# Patient Record
Sex: Female | Born: 1965 | Race: White | Hispanic: No | Smoking: Former smoker
Health system: Southern US, Community
[De-identification: ages and names within clinical notes are randomized; demographics above are authoritative.]

## PROBLEM LIST (undated history)

## (undated) DIAGNOSIS — I10 Essential (primary) hypertension: Secondary | ICD-10-CM

## (undated) DIAGNOSIS — J302 Other seasonal allergic rhinitis: Secondary | ICD-10-CM

## (undated) DIAGNOSIS — E7800 Pure hypercholesterolemia, unspecified: Secondary | ICD-10-CM

## (undated) DIAGNOSIS — J45909 Unspecified asthma, uncomplicated: Secondary | ICD-10-CM

## (undated) DIAGNOSIS — M1990 Unspecified osteoarthritis, unspecified site: Secondary | ICD-10-CM

## (undated) DIAGNOSIS — R7303 Prediabetes: Secondary | ICD-10-CM

## (undated) HISTORY — PX: TOTAL SHOULDER ARTHROPLASTY: SHX126

## (undated) HISTORY — DX: Unspecified osteoarthritis, unspecified site: M19.90

## (undated) HISTORY — DX: Prediabetes: R73.03

## (undated) HISTORY — PX: CARPAL TUNNEL RELEASE: SHX101

## (undated) HISTORY — DX: Pure hypercholesterolemia, unspecified: E78.00

---

## 1997-04-27 ENCOUNTER — Other Ambulatory Visit: Admission: RE | Admit: 1997-04-27 | Discharge: 1997-04-27 | Payer: Self-pay | Admitting: Obstetrics and Gynecology

## 1998-04-30 ENCOUNTER — Other Ambulatory Visit: Admission: RE | Admit: 1998-04-30 | Discharge: 1998-04-30 | Payer: Self-pay | Admitting: Obstetrics and Gynecology

## 1999-04-24 ENCOUNTER — Other Ambulatory Visit: Admission: RE | Admit: 1999-04-24 | Discharge: 1999-04-24 | Payer: Self-pay | Admitting: Obstetrics and Gynecology

## 1999-12-14 HISTORY — PX: CHOLECYSTECTOMY: SHX55

## 1999-12-23 ENCOUNTER — Inpatient Hospital Stay (HOSPITAL_COMMUNITY): Admission: AD | Admit: 1999-12-23 | Discharge: 1999-12-26 | Payer: Self-pay | Admitting: Obstetrics and Gynecology

## 2000-05-05 ENCOUNTER — Other Ambulatory Visit: Admission: RE | Admit: 2000-05-05 | Discharge: 2000-05-05 | Payer: Self-pay | Admitting: Obstetrics and Gynecology

## 2000-10-29 ENCOUNTER — Ambulatory Visit (HOSPITAL_COMMUNITY): Admission: RE | Admit: 2000-10-29 | Discharge: 2000-10-29 | Payer: Self-pay | Admitting: Obstetrics and Gynecology

## 2000-10-29 ENCOUNTER — Encounter: Payer: Self-pay | Admitting: Obstetrics and Gynecology

## 2002-02-22 ENCOUNTER — Other Ambulatory Visit: Admission: RE | Admit: 2002-02-22 | Discharge: 2002-02-22 | Payer: Self-pay | Admitting: Gynecology

## 2003-03-28 ENCOUNTER — Other Ambulatory Visit: Admission: RE | Admit: 2003-03-28 | Discharge: 2003-03-28 | Payer: Self-pay | Admitting: Obstetrics and Gynecology

## 2004-04-18 ENCOUNTER — Other Ambulatory Visit: Admission: RE | Admit: 2004-04-18 | Discharge: 2004-04-18 | Payer: Self-pay | Admitting: Obstetrics and Gynecology

## 2007-01-14 HISTORY — PX: SHOULDER OPEN ROTATOR CUFF REPAIR: SHX2407

## 2012-05-20 ENCOUNTER — Other Ambulatory Visit: Payer: Self-pay | Admitting: Obstetrics and Gynecology

## 2012-05-20 DIAGNOSIS — R928 Other abnormal and inconclusive findings on diagnostic imaging of breast: Secondary | ICD-10-CM

## 2012-06-01 ENCOUNTER — Ambulatory Visit
Admission: RE | Admit: 2012-06-01 | Discharge: 2012-06-01 | Disposition: A | Discharge status: Home / Self Care | Payer: Managed Care, Other (non HMO) | Source: Ambulatory Visit | Attending: Obstetrics and Gynecology | Admitting: Obstetrics and Gynecology

## 2012-06-01 ENCOUNTER — Other Ambulatory Visit: Payer: Self-pay | Admitting: Obstetrics and Gynecology

## 2012-06-01 DIAGNOSIS — R928 Other abnormal and inconclusive findings on diagnostic imaging of breast: Secondary | ICD-10-CM

## 2012-06-09 ENCOUNTER — Encounter (INDEPENDENT_AMBULATORY_CARE_PROVIDER_SITE_OTHER): Payer: Self-pay | Admitting: General Surgery

## 2012-06-09 ENCOUNTER — Ambulatory Visit (INDEPENDENT_AMBULATORY_CARE_PROVIDER_SITE_OTHER): Payer: PRIVATE HEALTH INSURANCE | Admitting: General Surgery

## 2012-06-09 VITALS — BP 126/74 | HR 68 | Temp 98.4°F | Resp 18 | Ht 65.0 in | Wt 234.0 lb

## 2012-06-09 DIAGNOSIS — R928 Other abnormal and inconclusive findings on diagnostic imaging of breast: Secondary | ICD-10-CM | POA: Insufficient documentation

## 2012-06-09 NOTE — Patient Instructions (Addendum)
Your recent mammogram showed a small abnormally shaped density in the lower outer quadrant of the right breast. The biopsy did not show cancer, but there is a small chance that this could lead to cancer or there might be cancer hidden nearby. You had been advised to have this area excised conservatively.  You'll be scheduled for a right breast biopsy, also known as a right partial mastectomy with needle localization in the near future.  We will let the pathology report 2 or 3 days postoperatively.    Lumpectomy, Breast Conserving Surgery A lumpectomy is breast surgery that removes only part of the breast. Another name used may be partial mastectomy. The amount removed varies. Make sure you understand how much of your breast will be removed. Reasons for a lumpectomy:  Any solid breast mass.  Grouped significant nodularity that may be confused with a solitary breast mass. Lumpectomy is the most common form of breast cancer surgery today. The surgeon removes the portion of your breast which contains the tumor (cancer). This is the lump. Some normal tissue around the lump is also removed to be sure that all the tumor has been removed.  If cancer cells are found in the margins where the breast tissue was removed, your surgeon will do more surgery to remove the remaining cancer tissue. This is called re-excision surgery. Radiation and/or chemotherapy treatments are often given following a lumpectomy to kill any cancer cells that could possibly remain.  REASONS YOU MAY NOT BE ABLE TO HAVE BREAST CONSERVING SURGERY:  The tumor is located in more than one place.  Your breast is small and the tumor is large so the breast would be disfigured.  The entire tumor removal is not successful with a lumpectomy.  You cannot commit to a full course of chemotherapy, radiation therapy or are pregnant and cannot have radiation.  You have previously had radiation to the breast to treat cancer. HOW A LUMPECTOMY  IS PERFORMED If overnight nursing is not required following a biopsy, a lumpectomy can be performed as a same-day surgery. This can be done in a hospital, clinic, or surgical center. The anesthesia used will depend on your surgeon. They will discuss this with you. A general anesthetic keeps you sleeping through the procedure. LET YOUR CAREGIVERS KNOW ABOUT THE FOLLOWING:  Allergies  Medications taken including herbs, eye drops, over the counter medications, and creams.  Use of steroids (by mouth or creams)  Previous problems with anesthetics or Novocaine.  Possibility of pregnancy, if this applies  History of blood clots (thrombophlebitis)  History of bleeding or blood problems.  Previous surgery  Other health problems BEFORE THE PROCEDURE You should be present one hour prior to your procedure unless directed otherwise.  AFTER THE PROCEDURE  After surgery, you will be taken to the recovery area where a nurse will watch and check your progress. Once you're awake, stable, and taking fluids well, barring other problems you will be allowed to go home.  Ice packs applied to your operative site may help with discomfort and keep the swelling down.  A small rubber drain may be placed in the breast for a couple of days to prevent a hematoma from developing in the breast.  A pressure dressing may be applied for 24 to 48 hours to prevent bleeding.  Keep the wound dry.  You may resume a normal diet and activities as directed. Avoid strenuous activities affecting the arm on the side of the biopsy site such as tennis, swimming,  heavy lifting (more than 10 pounds) or pulling.  Bruising in the breast is normal following this procedure.  Wearing a bra - even to bed - may be more comfortable and also help keep the dressing on.  Change dressings as directed.  Only take over-the-counter or prescription medicines for pain, discomfort, or fever as directed by your caregiver. Call for your  results as instructed by your surgeon. Remember it is your responsibility to get the results of your lumpectomy if your surgeon asked you to follow-up. Do not assume everything is fine if you have not heard from your caregiver. SEEK MEDICAL CARE IF:   There is increased bleeding (more than a small spot) from the wound.  You notice redness, swelling, or increasing pain in the wound.  Pus is coming from wound.  An unexplained oral temperature above 102 F (38.9 C) develops.  You notice a foul smell coming from the wound or dressing. SEEK IMMEDIATE MEDICAL CARE IF:   You develop a rash.  You have difficulty breathing.  You have any allergic problems. Document Released: 02/10/2006 Document Revised: 03/24/2011 Document Reviewed: 05/14/2006 [facility] Patient Information 2014 [place], [place].

## 2012-06-09 NOTE — Progress Notes (Signed)
Patient ID: Mercedes [name], female   DOB: 16-Jan-1965, 47 y.o.   MRN: [id number]  Chief Complaint  Patient presents with  . New Evaluation    lesion rt breast    HPI Mercedes [name] is a 47 y.o. female.  She is referred by Dr. Ulyess Blossom for evaluation and management of an abnormal mammographic finding of the right breast, lower outer quadrant.   The patient has no prior history of breast palm problems, and she gets annual screening mammograms. Recent screening mammograms showed a normal left breast, but noticed a new 9 mm irregular density at the 8:00 position of the right breast, 8 cm from the nipple. This is less than 1 cm in size. 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 Dr. Sadie Haber referred her and suggested that this area be excised.  Family history is positive for breast cancer in a paternal aunt,, But no other significant family history.  Comorbidities include obesity, hypertension, and anxiety and depression. HPI  History reviewed. No pertinent past medical history.  Past Surgical History  Procedure Laterality Date  . Shoulder open rotator cuff repair  2009    rt shoulder  . Cholecystectomy  12/01  . Carpal tunnel release  unsure    bilaterally  . Cesarean section  12/01    Family History  Problem Relation Age of Onset  . Diabetes Mother   . Hypertension Mother   . Hyperlipidemia Mother   . COPD Father   . Arthritis Father     Social History History  Substance Use Topics  . Smoking status: Former Smoker    Quit date: 01/14/1995  . Smokeless tobacco: Not on file  . Alcohol Use: No    No Known Allergies  Current Outpatient Prescriptions  Medication Sig Dispense Refill  . atorvastatin (LIPITOR) 40 MG tablet       . buPROPion (WELLBUTRIN XL) 300 MG 24 hr tablet       . lisinopril-hydrochlorothiazide (PRINZIDE,ZESTORETIC) 20-12.5 MG per tablet       . sertraline (ZOLOFT) 50 MG tablet       . valACYclovir (VALTREX) 1000 MG  tablet        No current facility-administered medications for this visit.    Review of Systems Review of Systems  Constitutional: Negative for fever, chills and unexpected weight change.  HENT: Negative for hearing loss, congestion, sore throat, trouble swallowing and voice change.   Eyes: Negative for visual disturbance.  Respiratory: Negative for cough and wheezing.   Cardiovascular: Negative for chest pain, palpitations and leg swelling.  Gastrointestinal: Negative for nausea, vomiting, abdominal pain, diarrhea, constipation, blood in stool, abdominal distention and anal bleeding.  Genitourinary: Negative for hematuria, vaginal bleeding and difficulty urinating.  Musculoskeletal: Negative for arthralgias.  Skin: Negative for rash and wound.  Neurological: Negative for seizures, syncope and headaches.  Hematological: Negative for adenopathy. Does not bruise/bleed easily.  Psychiatric/Behavioral: Negative for confusion.    Blood pressure 126/74, pulse 68, temperature 98.4 F (36.9 C), resp. rate 18, height 5' 5"  (1.651 m), weight 234 lb (106.142 kg), last menstrual period 05/19/2012.  Physical Exam Physical Exam  Constitutional: She is oriented to person, place, and time. She appears well-developed and well-nourished. No distress.  HENT:  Head: Normocephalic and atraumatic.  Nose: Nose normal.  Mouth/Throat: No oropharyngeal exudate.  Eyes: Conjunctivae and EOM are normal. Pupils are equal, round, and reactive to light. Left eye exhibits no discharge. No scleral icterus.  Neck: Neck supple.  No JVD present. No tracheal deviation present. No thyromegaly present.  Cardiovascular: Normal rate, regular rhythm, normal heart sounds and intact distal pulses.   No murmur heard. Pulmonary/Chest: Effort normal and breath sounds normal. No respiratory distress. She has no wheezes. She has no rales. She exhibits no tenderness.  Breasts are moderate to moderately large in size. Biopsy site  right breast, lower outer quadrant. Minimal ecchymoses. No hematoma. No palpable mass in either breast. No other skin changes. No axillary adenopathy.  Abdominal: Soft. Bowel sounds are normal. She exhibits no distension and no mass. There is no tenderness. There is no rebound and no guarding.  Musculoskeletal: She exhibits no edema and no tenderness.  Lymphadenopathy:    She has no cervical adenopathy.  Neurological: She is alert and oriented to person, place, and time. She exhibits normal muscle tone. Coordination normal.  Skin: Skin is warm. No rash noted. She is not diaphoretic. No erythema. No pallor.  Psychiatric: She has a normal mood and affect. Her behavior is normal. Judgment and thought content normal.    Data Reviewed Imaging studies. Pathology report.  Assessment    Abnormal mammogram right breast, lower outer quadrant. Biopsy shows complex sclerosing lesion. Excision recommended by radiologist.  Hypertension  Obesity  Anxiety and depression     Plan    The patient was offered a conservative right partial mastectomy with needle localization, and she would like to do that  She was scheduled for right partial mastectomy with needle localization the near future.  I discussed the indications, details, techniques, and numerous risks of the surgery with her. She is aware of the risk of bleeding, infection, cosmetic deformity, skin necrosis, reoperation if this is cancer, nerve damage with chronic pain and other unforseen problems. She understands all these issues. All of her questions are answered. She agrees with this plan.        Edsel Petrin. Dalbert Batman, M.D., [facility] General and Minimally invasive Surgery Breast and Colorectal Surgery Office:   [phone] Pager:   [phone]  06/09/2012, 4:04 PM

## 2012-06-16 ENCOUNTER — Telehealth (INDEPENDENT_AMBULATORY_CARE_PROVIDER_SITE_OTHER): Payer: Self-pay

## 2012-06-16 NOTE — Telephone Encounter (Signed)
I called the pt and let her know that I am working on her postop appointment.  Her surgery is 6/27 and I may need to get some additional office time added on to get postoperative patients in.

## 2012-06-19 DIAGNOSIS — B029 Zoster without complications: Secondary | ICD-10-CM | POA: Insufficient documentation

## 2012-07-05 ENCOUNTER — Telehealth (INDEPENDENT_AMBULATORY_CARE_PROVIDER_SITE_OTHER): Payer: Self-pay

## 2012-07-05 ENCOUNTER — Encounter (HOSPITAL_BASED_OUTPATIENT_CLINIC_OR_DEPARTMENT_OTHER): Payer: Self-pay | Admitting: *Deleted

## 2012-07-05 NOTE — Telephone Encounter (Signed)
I called the patient and gave her the postop appointment for 07-22-12 at 12:30

## 2012-07-05 NOTE — Progress Notes (Signed)
To come in for bmet-ekg-has been several yr since last ekg- Has some allergy-asthma-

## 2012-07-07 ENCOUNTER — Other Ambulatory Visit: Payer: Self-pay

## 2012-07-07 ENCOUNTER — Encounter (HOSPITAL_BASED_OUTPATIENT_CLINIC_OR_DEPARTMENT_OTHER)
Admission: RE | Admit: 2012-07-07 | Discharge: 2012-07-07 | Disposition: A | Discharge status: Home / Self Care | Payer: PRIVATE HEALTH INSURANCE | Source: Ambulatory Visit | Attending: General Surgery | Admitting: General Surgery

## 2012-07-07 LAB — BASIC METABOLIC PANEL
CO2: 29 mEq/L (ref 19–32)
Chloride: 100 mEq/L (ref 96–112)
Creatinine, Ser: 0.64 mg/dL (ref 0.50–1.10)
GFR calc Af Amer: >90 mL/min (ref >90)
Potassium: 4 mEq/L (ref 3.5–5.1)
Sodium: 136 mEq/L (ref 135–145)

## 2012-07-07 NOTE — H&P (Signed)
Mercedes [name]   MRN:  [id number]   Description: 47 year old female  Provider: Adin Hector, MD  Department: [facility]        Diagnoses    Abnormal mammogram    -  Primary    793.80            Current Vitals    BP Pulse Temp(Src) Resp Ht Wt    126/74 68 98.4 F (36.9 C) 18 5' 5"  (1.651 m) 234 lb (106.142 kg)    BMI - 38.94 kg/m2 05/19/2012               History and Physical   Adin Hector, MD     Status: Signed                              HPI Mercedes [name] is a 47 y.o. female.  She is referred by Dr. Ulyess [name] for evaluation and management of an abnormal mammographic finding of the right breast, lower outer quadrant.    The patient has no prior history of breast palm problems, and she gets annual screening mammograms. Recent screening mammograms showed a normal left breast, but noticed a new 9 mm irregular density at the 8:00 position of the right breast, 8 cm from the nipple. This is less than 1 cm in size. 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 Dr. Sadie [name] referred her and suggested that this area be excised.   Family history is positive for breast cancer in a paternal aunt,, But no other significant family history.   Comorbidities include obesity, hypertension, and anxiety and depression.     .    Past Surgical History   Procedure  Laterality  Date   .  Shoulder open rotator cuff repair    2009       rt shoulder   .  Cholecystectomy    12/01   .  Carpal tunnel release    unsure       bilaterally   .  Cesarean section    12/01         Family History   Problem  Relation  Age of Onset   .  Diabetes  Mother     .  Hypertension  Mother     .  Hyperlipidemia  Mother     .  COPD  Father     .  Arthritis  Father          Social History History   Substance Use Topics   .  Smoking status:  Former Smoker       Quit date:  01/14/1995   .  Smokeless tobacco:  Not on file   .   Alcohol Use:  No        No Known Allergies    Current Outpatient Prescriptions   Medication  Sig  Dispense  Refill   .  atorvastatin (LIPITOR) 40 MG tablet           .  buPROPion (WELLBUTRIN XL) 300 MG 24 hr tablet           .  lisinopril-hydrochlorothiazide (PRINZIDE,ZESTORETIC) 20-12.5 MG per tablet           .  sertraline (ZOLOFT) 50 MG tablet           .  valACYclovir (VALTREX) 1000 MG tablet  No current facility-administered medications for this visit.        Review of Systems  Constitutional: Negative for fever, chills and unexpected weight change.  HENT: Negative for hearing loss, congestion, sore throat, trouble swallowing and voice change.   Eyes: Negative for visual disturbance.  Respiratory: Negative for cough and wheezing.   Cardiovascular: Negative for chest pain, palpitations and leg swelling.  Gastrointestinal: Negative for nausea, vomiting, abdominal pain, diarrhea, constipation, blood in stool, abdominal distention and anal bleeding.  Genitourinary: Negative for hematuria, vaginal bleeding and difficulty urinating.  Musculoskeletal: Negative for arthralgias.  Skin: Negative for rash and wound.  Neurological: Negative for seizures, syncope and headaches.  Hematological: Negative for adenopathy. Does not bruise/bleed easily.  Psychiatric/Behavioral: Negative for confusion.      Blood pressure 126/74, pulse 68, temperature 98.4 F (36.9 C), resp. rate 18, height 5' 5"  (1.651 m), weight 234 lb (106.142 kg), last menstrual period 05/19/2012.   Physical Exam  Constitutional: She is oriented to person, place, and time. She appears well-developed and well-nourished. No distress.  HENT:   Head: Normocephalic and atraumatic.   Nose: Nose normal.   Mouth/Throat: No oropharyngeal exudate.  Eyes: Conjunctivae and EOM are normal. Pupils are equal, round, and reactive to light. Left eye exhibits no discharge. No scleral icterus.  Neck: Neck supple.  No JVD present. No tracheal deviation present. No thyromegaly present.  Cardiovascular: Normal rate, regular rhythm, normal heart sounds and intact distal pulses.    No murmur heard. Pulmonary/Chest: Effort normal and breath sounds normal. No respiratory distress. She has no wheezes. She has no rales. She exhibits no tenderness.  Breasts are moderate to moderately large in size. Biopsy site right breast, lower outer quadrant. Minimal ecchymoses. No hematoma. No palpable mass in either breast. No other skin changes. No axillary adenopathy.  Abdominal: Soft. Bowel sounds are normal. She exhibits no distension and no mass. There is no tenderness. There is no rebound and no guarding.  Musculoskeletal: She exhibits no edema and no tenderness.  Lymphadenopathy:    She has no cervical adenopathy.  Neurological: She is alert and oriented to person, place, and time. She exhibits normal muscle tone. Coordination normal.  Skin: Skin is warm. No rash noted. She is not diaphoretic. No erythema. No pallor.  Psychiatric: She has a normal mood and affect. Her behavior is normal. Judgment and thought content normal.      Data Reviewed Imaging studies. Pathology report.   Assessment    Abnormal mammogram right breast, lower outer quadrant. Biopsy shows complex sclerosing lesion. Excision recommended by radiologist.   Hypertension   Obesity   Anxiety and depression      Plan    The patient was offered a conservative right partial mastectomy with needle localization, and she would like to do that   She was scheduled for right partial mastectomy with needle localization the near future.   I discussed the indications, details, techniques, and numerous risks of the surgery with her. She is aware of the risk of bleeding, infection, cosmetic deformity, skin necrosis, reoperation if this is cancer, nerve damage with chronic pain and other unforseen problems. She understands all these issues. All of  her questions are answered. She agrees with this plan.           Mercedes [name]. Mercedes [name], M.D., [facility] General and Minimally invasive Surgery Breast and Colorectal Surgery Office:   [phone] Pager:   [phone]

## 2012-07-09 ENCOUNTER — Ambulatory Visit
Admission: RE | Admit: 2012-07-09 | Discharge: 2012-07-09 | Disposition: A | Discharge status: Home / Self Care | Payer: PRIVATE HEALTH INSURANCE | Source: Ambulatory Visit | Attending: General Surgery | Admitting: General Surgery

## 2012-07-09 ENCOUNTER — Encounter (HOSPITAL_BASED_OUTPATIENT_CLINIC_OR_DEPARTMENT_OTHER)
Admission: RE | Disposition: A | Discharge status: Home / Self Care | Payer: Self-pay | Source: Ambulatory Visit | Attending: General Surgery

## 2012-07-09 ENCOUNTER — Ambulatory Visit (HOSPITAL_BASED_OUTPATIENT_CLINIC_OR_DEPARTMENT_OTHER)
Admission: RE | Admit: 2012-07-09 | Discharge: 2012-07-09 | Disposition: A | Discharge status: Home / Self Care | Payer: PRIVATE HEALTH INSURANCE | Source: Ambulatory Visit | Attending: General Surgery | Admitting: General Surgery

## 2012-07-09 ENCOUNTER — Ambulatory Visit (HOSPITAL_BASED_OUTPATIENT_CLINIC_OR_DEPARTMENT_OTHER): Payer: PRIVATE HEALTH INSURANCE | Admitting: Certified Registered"

## 2012-07-09 ENCOUNTER — Encounter (HOSPITAL_BASED_OUTPATIENT_CLINIC_OR_DEPARTMENT_OTHER): Payer: Self-pay | Admitting: Certified Registered"

## 2012-07-09 DIAGNOSIS — Z79899 Other long term (current) drug therapy: Secondary | ICD-10-CM | POA: Insufficient documentation

## 2012-07-09 DIAGNOSIS — Z87891 Personal history of nicotine dependence: Secondary | ICD-10-CM | POA: Insufficient documentation

## 2012-07-09 DIAGNOSIS — F3289 Other specified depressive episodes: Secondary | ICD-10-CM | POA: Insufficient documentation

## 2012-07-09 DIAGNOSIS — I10 Essential (primary) hypertension: Secondary | ICD-10-CM | POA: Insufficient documentation

## 2012-07-09 DIAGNOSIS — R928 Other abnormal and inconclusive findings on diagnostic imaging of breast: Secondary | ICD-10-CM

## 2012-07-09 DIAGNOSIS — N6089 Other benign mammary dysplasias of unspecified breast: Secondary | ICD-10-CM

## 2012-07-09 DIAGNOSIS — N63 Unspecified lump in unspecified breast: Secondary | ICD-10-CM | POA: Insufficient documentation

## 2012-07-09 DIAGNOSIS — F411 Generalized anxiety disorder: Secondary | ICD-10-CM | POA: Insufficient documentation

## 2012-07-09 DIAGNOSIS — Z803 Family history of malignant neoplasm of breast: Secondary | ICD-10-CM | POA: Insufficient documentation

## 2012-07-09 DIAGNOSIS — N6029 Fibroadenosis of unspecified breast: Secondary | ICD-10-CM | POA: Insufficient documentation

## 2012-07-09 DIAGNOSIS — Z6838 Body mass index (BMI) 38.0-38.9, adult: Secondary | ICD-10-CM | POA: Insufficient documentation

## 2012-07-09 DIAGNOSIS — F329 Major depressive disorder, single episode, unspecified: Secondary | ICD-10-CM | POA: Insufficient documentation

## 2012-07-09 DIAGNOSIS — N6019 Diffuse cystic mastopathy of unspecified breast: Secondary | ICD-10-CM | POA: Insufficient documentation

## 2012-07-09 DIAGNOSIS — E669 Obesity, unspecified: Secondary | ICD-10-CM | POA: Insufficient documentation

## 2012-07-09 HISTORY — DX: Essential (primary) hypertension: I10

## 2012-07-09 HISTORY — DX: Other seasonal allergic rhinitis: J30.2

## 2012-07-09 HISTORY — PX: PARTIAL MASTECTOMY WITH NEEDLE LOCALIZATION: SHX6008

## 2012-07-09 HISTORY — DX: Unspecified asthma, uncomplicated: J45.909

## 2012-07-09 SURGERY — PARTIAL MASTECTOMY WITH NEEDLE LOCALIZATION
Anesthesia: General | Site: Breast | Laterality: Right | Wound class: Clean

## 2012-07-09 MED ORDER — OXYCODONE HCL 5 MG/5ML PO SOLN: 5.0000 mg | Freq: Once | ORAL | Status: DC | PRN

## 2012-07-09 MED ORDER — OXYCODONE HCL 5 MG PO TABS: 5.0000 mg | ORAL_TABLET | ORAL | Status: DC | PRN

## 2012-07-09 MED ORDER — SODIUM CHLORIDE 0.9 % IV SOLN: INTRAVENOUS | Status: DC

## 2012-07-09 MED ORDER — PROPOFOL 10 MG/ML IV BOLUS
INTRAVENOUS | Status: DC | PRN
  Administered 2012-07-09: 40 mg via INTRAVENOUS
  Administered 2012-07-09: 160 mg via INTRAVENOUS

## 2012-07-09 MED ORDER — DEXAMETHASONE SODIUM PHOSPHATE 4 MG/ML IJ SOLN
INTRAMUSCULAR | Status: DC | PRN
  Administered 2012-07-09: 10 mg via INTRAVENOUS

## 2012-07-09 MED ORDER — MIDAZOLAM HCL 2 MG/2ML IJ SOLN: 1.0000 mg | INTRAMUSCULAR | Status: DC | PRN

## 2012-07-09 MED ORDER — ONDANSETRON HCL 4 MG/2ML IJ SOLN
INTRAMUSCULAR | Status: DC | PRN
  Administered 2012-07-09: 4 mg via INTRAVENOUS

## 2012-07-09 MED ORDER — OXYCODONE HCL 5 MG PO TABS: 5.0000 mg | ORAL_TABLET | Freq: Once | ORAL | Status: DC | PRN

## 2012-07-09 MED ORDER — MIDAZOLAM HCL 5 MG/5ML IJ SOLN
INTRAMUSCULAR | Status: DC | PRN
  Administered 2012-07-09: 2 mg via INTRAVENOUS

## 2012-07-09 MED ORDER — DEXTROSE 5 % IV SOLN
3.0000 g | INTRAVENOUS | Status: AC
  Administered 2012-07-09: 3 g via INTRAVENOUS

## 2012-07-09 MED ORDER — MORPHINE SULFATE 2 MG/ML IJ SOLN: 2.0000 mg | INTRAMUSCULAR | Status: DC | PRN

## 2012-07-09 MED ORDER — ACETAMINOPHEN 650 MG RE SUPP: 650.0000 mg | RECTAL | Status: DC | PRN

## 2012-07-09 MED ORDER — HYDROCODONE-ACETAMINOPHEN 5-325 MG PO TABS: 1.0000 | ORAL_TABLET | ORAL | Status: DC | PRN

## 2012-07-09 MED ORDER — SODIUM CHLORIDE 0.9 % IJ SOLN: 3.0000 mL | Freq: Two times a day (BID) | INTRAMUSCULAR | Status: DC

## 2012-07-09 MED ORDER — ONDANSETRON HCL 4 MG/2ML IJ SOLN
4.0000 mg | Freq: Four times a day (QID) | INTRAMUSCULAR | Status: DC | PRN
Start: 2012-07-09 — End: 2012-07-09

## 2012-07-09 MED ORDER — BUPIVACAINE-EPINEPHRINE 0.5% -1:200000 IJ SOLN
INTRAMUSCULAR | Status: DC | PRN
  Administered 2012-07-09: 10 mL

## 2012-07-09 MED ORDER — FENTANYL CITRATE 0.05 MG/ML IJ SOLN: 50.0000 ug | INTRAMUSCULAR | Status: DC | PRN

## 2012-07-09 MED ORDER — SODIUM CHLORIDE 0.9 % IV SOLN: 250.0000 mL | INTRAVENOUS | Status: DC | PRN

## 2012-07-09 MED ORDER — MORPHINE SULFATE 2 MG/ML IJ SOLN
1.0000 mg | INTRAMUSCULAR | Status: DC | PRN
  Administered 2012-07-09 (×2): 2 mg via INTRAVENOUS

## 2012-07-09 MED ORDER — FENTANYL CITRATE 0.05 MG/ML IJ SOLN
INTRAMUSCULAR | Status: DC | PRN
  Administered 2012-07-09: 100 ug via INTRAVENOUS
  Administered 2012-07-09: 25 ug via INTRAVENOUS

## 2012-07-09 MED ORDER — KETOROLAC TROMETHAMINE 30 MG/ML IJ SOLN
30.0000 mg | Freq: Once | INTRAMUSCULAR | Status: AC
  Administered 2012-07-09: 30 mg via INTRAVENOUS

## 2012-07-09 MED ORDER — PROMETHAZINE HCL 25 MG/ML IJ SOLN: 6.2500 mg | INTRAMUSCULAR | Status: DC | PRN

## 2012-07-09 MED ORDER — LIDOCAINE HCL (CARDIAC) 20 MG/ML IV SOLN
INTRAVENOUS | Status: DC | PRN
  Administered 2012-07-09: 60 mg via INTRAVENOUS

## 2012-07-09 MED ORDER — CHLORHEXIDINE GLUCONATE 4 % EX LIQD: 1.0000 "application " | Freq: Once | CUTANEOUS | Status: DC

## 2012-07-09 MED ORDER — ACETAMINOPHEN 325 MG PO TABS: 650.0000 mg | ORAL_TABLET | ORAL | Status: DC | PRN

## 2012-07-09 MED ORDER — LACTATED RINGERS IV SOLN
INTRAVENOUS | Status: DC
  Administered 2012-07-09 (×2): via INTRAVENOUS

## 2012-07-09 MED ORDER — SODIUM CHLORIDE 0.9 % IJ SOLN: 3.0000 mL | INTRAMUSCULAR | Status: DC | PRN

## 2012-07-09 SURGICAL SUPPLY — 58 items
ADH SKN CLS APL DERMABOND .7 (GAUZE/BANDAGES/DRESSINGS) ×1
APL SKNCLS STERI-STRIP NONHPOA (GAUZE/BANDAGES/DRESSINGS)
APPLIER CLIP 9.375 MED OPEN (MISCELLANEOUS)
APR CLP MED 9.3 20 MLT OPN (MISCELLANEOUS)
BANDAGE ELASTIC 6 VELCRO ST LF (GAUZE/BANDAGES/DRESSINGS) IMPLANT
BENZOIN TINCTURE PRP APPL 2/3 (GAUZE/BANDAGES/DRESSINGS) IMPLANT
BLADE HEX COATED 2.75 (ELECTRODE) ×2 IMPLANT
BLADE SURG 15 STRL LF DISP TIS (BLADE) ×2 IMPLANT
BLADE SURG 15 STRL SS (BLADE) ×4
CANISTER SUCTION 1200CC (MISCELLANEOUS) ×2 IMPLANT
CHLORAPREP W/TINT 26ML (MISCELLANEOUS) ×2 IMPLANT
CLIP APPLIE 9.375 MED OPEN (MISCELLANEOUS) IMPLANT
CLOTH BEACON ORANGE TIMEOUT ST (SAFETY) ×2 IMPLANT
COVER MAYO STAND STRL (DRAPES) ×2 IMPLANT
COVER TABLE BACK 60X90 (DRAPES) ×2 IMPLANT
DECANTER SPIKE VIAL GLASS SM (MISCELLANEOUS) IMPLANT
DERMABOND ADVANCED (GAUZE/BANDAGES/DRESSINGS) ×1
DERMABOND ADVANCED .7 DNX12 (GAUZE/BANDAGES/DRESSINGS) IMPLANT
DEVICE DUBIN W/COMP PLATE 8390 (MISCELLANEOUS) IMPLANT
DRAPE LAPAROSCOPIC ABDOMINAL (DRAPES) IMPLANT
DRAPE LAPAROTOMY TRNSV 102X78 (DRAPE) IMPLANT
DRAPE PED LAPAROTOMY (DRAPES) ×2 IMPLANT
DRAPE UTILITY XL STRL (DRAPES) ×2 IMPLANT
ELECT REM PT RETURN 9FT ADLT (ELECTROSURGICAL) ×2
ELECTRODE REM PT RTRN 9FT ADLT (ELECTROSURGICAL) ×1 IMPLANT
GAUZE SPONGE 4X4 12PLY STRL LF (GAUZE/BANDAGES/DRESSINGS) IMPLANT
GAUZE SPONGE 4X4 16PLY XRAY LF (GAUZE/BANDAGES/DRESSINGS) IMPLANT
GLOVE BIO SURGEON STRL SZ7 (GLOVE) ×1 IMPLANT
GLOVE BIOGEL PI IND STRL 7.0 (GLOVE) IMPLANT
GLOVE BIOGEL PI INDICATOR 7.0 (GLOVE) ×2
GLOVE EUDERMIC 7 POWDERFREE (GLOVE) ×2 IMPLANT
GLOVE SURG SS PI 7.0 STRL IVOR (GLOVE) ×1 IMPLANT
GOWN PREVENTION PLUS XLARGE (GOWN DISPOSABLE) ×3 IMPLANT
GOWN PREVENTION PLUS XXLARGE (GOWN DISPOSABLE) ×2 IMPLANT
KIT MARKER MARGIN INK (KITS) IMPLANT
NDL HYPO 25X1 1.5 SAFETY (NEEDLE) ×1 IMPLANT
NEEDLE HYPO 22GX1.5 SAFETY (NEEDLE) IMPLANT
NEEDLE HYPO 25X1 1.5 SAFETY (NEEDLE) ×2 IMPLANT
NS IRRIG 1000ML POUR BTL (IV SOLUTION) ×2 IMPLANT
PACK BASIN DAY SURGERY FS (CUSTOM PROCEDURE TRAY) ×2 IMPLANT
PENCIL BUTTON HOLSTER BLD 10FT (ELECTRODE) ×2 IMPLANT
SLEEVE SCD COMPRESS KNEE MED (MISCELLANEOUS) ×1 IMPLANT
SPONGE LAP 4X18 X RAY DECT (DISPOSABLE) ×2 IMPLANT
STRIP CLOSURE SKIN 1/2X4 (GAUZE/BANDAGES/DRESSINGS) IMPLANT
SUT ETHILON 4 0 PS 2 18 (SUTURE) IMPLANT
SUT MNCRL AB 4-0 PS2 18 (SUTURE) ×3 IMPLANT
SUT SILK 2 0 SH (SUTURE) ×2 IMPLANT
SUT VIC AB 2-0 SH 27 (SUTURE)
SUT VIC AB 2-0 SH 27XBRD (SUTURE) IMPLANT
SUT VIC AB 4-0 P-3 18XBRD (SUTURE) IMPLANT
SUT VIC AB 4-0 P3 18 (SUTURE)
SUT VICRYL 3-0 CR8 SH (SUTURE) ×2 IMPLANT
SYR BULB 3OZ (MISCELLANEOUS) ×1 IMPLANT
SYR CONTROL 10ML LL (SYRINGE) ×2 IMPLANT
TAPE HYPAFIX 4 X10 (GAUZE/BANDAGES/DRESSINGS) IMPLANT
TOWEL OR NON WOVEN STRL DISP B (DISPOSABLE) ×2 IMPLANT
TUBE CONNECTING 20X1/4 (TUBING) ×2 IMPLANT
YANKAUER SUCT BULB TIP NO VENT (SUCTIONS) ×2 IMPLANT

## 2012-07-09 NOTE — Anesthesia Procedure Notes (Signed)
Procedure Name: LMA Insertion Date/Time: 07/09/2012 9:13 AM Performed by: Denny Levy Pre-anesthesia Checklist: Patient identified, Emergency Drugs available, Suction available, Patient being monitored and Timeout performed Patient Re-evaluated:Patient Re-evaluated prior to inductionOxygen Delivery Method: Circle System Utilized Preoxygenation: Pre-oxygenation with 100% oxygen Intubation Type: IV induction Ventilation: Mask ventilation without difficulty LMA: LMA inserted LMA Size: 4.0 Number of attempts: 1 Airway Equipment and Method: bite block Placement Confirmation: positive ETCO2 Tube secured with: Tape Dental Injury: Teeth and Oropharynx as per pre-operative assessment  Comments: 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

## 2012-07-09 NOTE — Interval H&P Note (Signed)
History and Physical Interval Note:  07/09/2012 9:01 AM  Mercedes [name]  has presented today for surgery, with the diagnosis of Abnormal mammogram right breast  The goals and the various methods of treatment have been discussed with the patient and family. After consideration of risks, benefits and other options for treatment, the patient has consented to  Procedure(s): RIGHT PARTIAL MASTECTOMY WITH NEEDLE LOCALIZATION (Right) as a surgical intervention .  The patient's history has been reviewed, patient examined, no change in status, stable for surgery.  I have reviewed the patient's chart and labs.  Questions were answered to the patient's satisfaction.     Adin Hector

## 2012-07-09 NOTE — Anesthesia Preprocedure Evaluation (Addendum)
Anesthesia Evaluation  Patient identified by MRN, date of birth, ID band Patient awake    Reviewed: Allergy & Precautions, H&P , NPO status   History of Anesthesia Complications Negative for: history of anesthetic complications  Airway Mallampati: II  Neck ROM: Full    Dental  (+) Teeth Intact   Pulmonary asthma ,  breath sounds clear to auscultation        Cardiovascular hypertension, Rhythm:Regular Rate:Normal     Neuro/Psych    GI/Hepatic negative GI ROS, Neg liver ROS,   Endo/Other  negative endocrine ROS  Renal/GU negative Renal ROS     Musculoskeletal   Abdominal (+) + obese,   Peds  Hematology negative hematology ROS (+)   Anesthesia Other Findings   Reproductive/Obstetrics                          Anesthesia Physical Anesthesia Plan  ASA: III  Anesthesia Plan: General   Post-op Pain Management:    Induction: Intravenous  Airway Management Planned: LMA  Additional Equipment:   Intra-op Plan:   Post-operative Plan: Extubation in OR  Informed Consent: I have reviewed the patients History and Physical, chart, labs and discussed the procedure including the risks, benefits and alternatives for the proposed anesthesia with the patient or authorized representative who has indicated his/her understanding and acceptance.   Dental advisory given  Plan Discussed with: CRNA and Surgeon  Anesthesia Plan Comments:         Anesthesia Quick Evaluation

## 2012-07-09 NOTE — Transfer of Care (Signed)
Immediate Anesthesia Transfer of Care Note  Patient: Mercedes [name]  Procedure(s) Performed: Procedure(s): RIGHT PARTIAL MASTECTOMY WITH NEEDLE LOCALIZATION (Right)  Patient Location: PACU  Anesthesia Type:General  Level of Consciousness: awake and alert   Airway & Oxygen Therapy: Patient Spontanous Breathing and Patient connected to face mask oxygen  Post-op Assessment: Report given to PACU RN and Post -op Vital signs reviewed and stable  Post vital signs: Reviewed and stable  Complications: No apparent anesthesia complications

## 2012-07-09 NOTE — Transfer of Care (Signed)
Immediate Anesthesia Transfer of Care Note  Patient: Mercedes [name]  Procedure(s) Performed: Procedure(s): RIGHT PARTIAL MASTECTOMY WITH NEEDLE LOCALIZATION (Right)  Patient Location: PACU  Anesthesia Type:General  Level of Consciousness: sedated and unresponsive  Airway & Oxygen Therapy: Patient Spontanous Breathing and Patient connected to face mask oxygen  Post-op Assessment: Report given to PACU RN and Post -op Vital signs reviewed and stable  Post vital signs: Reviewed and stable  Complications: No apparent anesthesia complications

## 2012-07-09 NOTE — Op Note (Signed)
Patient Name:           Mercedes [name]   Date of Surgery:        07/09/2012  Pre op Diagnosis:      Abnormal mammogram right breast, lower outer quadrant, complex sclerosing lesion  Post op Diagnosis:    Same  Procedure:                 Right partial mastectomy with needle localization  Surgeon:                     Edsel Petrin. Dalbert Batman, M.D., FACS  Assistant:                      None  Operative Indications:   Mercedes [name] is a 47 y.o. female. She was referred by Dr. Ulyess Blossom for evaluation and management of an abnormal mammographic finding of the right breast, lower outer quadrant.  The patient has no prior history of breast  problems, and she gets annual screening mammograms. Recent screening mammograms showed a normal left breast, but noticed a new 9 mm irregular density at the 8:00 position of the right breast, 8 cm from the nipple. This is less than 1 cm in size. 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 Dr. Sadie Haber referred her and suggested that this area be excised.  Family history is positive for breast cancer in a paternal aunt,, But no other significant family history.  Comorbidities include obesity, hypertension, and anxiety and depression   Operative Findings:       The excised density was in the lower outer quadrant of the right breast. The wire was placed from a lateral position and directed medially. The wire was well placed through the marker clip and just past. Specimen mammogram looked good with the entire wire and marker clip within the lesion and somewhat centrally placed.  Procedure in Detail:          Following wire placement the patient was transferred to the operating room at [facility] Gen. Anesthesia was induced. The right breast was prepped and draped in a sterile fashion. Intravenous antibiotics were given. Surgical time out was performed. 0.5% Marcaine with epinephrine was used as local infiltration anesthetic. After reviewing the  positioning of the wire both clinically and radiographically I chose to make a radially oriented linear incision the lower outer quadrant of the right breast through the insertion site of the wire. Dissection was carried down into the breast tissue around the wire. The specimen was removed and marked with silk sutures to orient the pathologist and the specimen mammogram looked good with the wire marker clip reasonably centered within the specimen. The specimen was marked and sent to the pathology lab. Hemostasis was adequate and achieved with electrocautery. The wound was irrigated with saline. The breast tissues were closed with interrupted sutures of 3-0 Vicryl and the skin closed with a running subcuticular suture of 4-0 Monocryl and Dermabond. The patient tolerated the procedure well. There were no complications. EBL 10 cc. Counts correct.     Edsel Petrin. Dalbert Batman, M.D., FACS General and Minimally Invasive Surgery Breast and Colorectal Surgery  07/09/2012 9:49 AM

## 2012-07-09 NOTE — Anesthesia Postprocedure Evaluation (Signed)
  Anesthesia Post-op Note  Patient: Mercedes [name]  Procedure(s) Performed: Procedure(s): RIGHT PARTIAL MASTECTOMY WITH NEEDLE LOCALIZATION (Right)  Patient Location: PACU  Anesthesia Type:General  Level of Consciousness: awake  Airway and Oxygen Therapy: Patient Spontanous Breathing  Post-op Pain: mild  Post-op Assessment: Post-op Vital signs reviewed  Post-op Vital Signs: stable  Complications: No apparent anesthesia complications

## 2012-07-12 ENCOUNTER — Encounter (HOSPITAL_BASED_OUTPATIENT_CLINIC_OR_DEPARTMENT_OTHER): Payer: Self-pay | Admitting: General Surgery

## 2012-07-13 ENCOUNTER — Telehealth (INDEPENDENT_AMBULATORY_CARE_PROVIDER_SITE_OTHER): Payer: Self-pay

## 2012-07-13 NOTE — Telephone Encounter (Signed)
I called and notified the pt of her results.  No cancer.  She has a follow up appointment.  I told her Dr Dalbert Batman may release her then and just have her get yearly mammograms and yearly breast exam.

## 2012-07-13 NOTE — Telephone Encounter (Signed)
Message copied by Gweneth Fritter on Tue Jul 13, 2012  4:20 PM ------      Message from: Adin Hector      Created: Tue Jul 13, 2012  1:08 PM       Please call pathology report the patient. No cancer. Good news.            hmi      ----- Message -----         From: [facility]         Sent: 07/07/2012   1:06 PM           To: Adin Hector, MD                   ------

## 2012-07-22 ENCOUNTER — Encounter (INDEPENDENT_AMBULATORY_CARE_PROVIDER_SITE_OTHER): Payer: Self-pay | Admitting: General Surgery

## 2012-07-22 ENCOUNTER — Ambulatory Visit (INDEPENDENT_AMBULATORY_CARE_PROVIDER_SITE_OTHER): Payer: PRIVATE HEALTH INSURANCE | Admitting: General Surgery

## 2012-07-22 VITALS — BP 120/82 | HR 84 | Temp 97.7°F | Resp 18 | Ht 65.0 in | Wt 237.0 lb

## 2012-07-22 DIAGNOSIS — R928 Other abnormal and inconclusive findings on diagnostic imaging of breast: Secondary | ICD-10-CM

## 2012-07-22 NOTE — Progress Notes (Signed)
Patient ID: Mercedes [name], female   DOB: Nov 09, 1965, 46 y.o.   MRN: [id number] History: This patient was recently evaluated for an abnormal mammogram of the right breast. On 07/09/2012 she underwent right partial mastectomy with needle localization. Final pathology report shows benign, complex sclerosing lesion, fibrocystic disease.She is doing well. She has no complaints about the wound. No pain or drainage or swelling  Exam: Patient looks well. No distress Right breast is examined. Lumpectomy scar inferolaterally is healing normally. Tissues are soft. No hematoma, infection, or drainage. Excellent cosmesis  Assessment: Abnormal mammogram right breast. Histologically this was confirmed as benign complex sclerosing lesion and fibrocystic disease. Recovering uneventfully following right partial mastectomy with needle localization  Plan: The patient was reassured. Pathology report discussed. Copy of report given to patient Wound care discussed Recommend annual mammography and annual breast exam Return to see me if further problems arise.   Edsel Petrin. Dalbert Batman, M.D., [facility] General and Minimally invasive Surgery Breast and Colorectal Surgery Office:   [phone] Pager:   [phone]

## 2012-07-22 NOTE — Patient Instructions (Addendum)
You are recovering from your right breast biopsy without any obvious surgical complications.  Final pathology report shows no cancer. Complex sclerosing lesion and fibrocystic disease were seen on microscopic examination.. These are low risk findings for the future.  Annual breast exam and annual mammography is recommended from here on out.  Return to see Dr. Dalbert Batman if further problems arise.

## 2012-12-14 DIAGNOSIS — E785 Hyperlipidemia, unspecified: Secondary | ICD-10-CM | POA: Insufficient documentation

## 2012-12-14 DIAGNOSIS — F411 Generalized anxiety disorder: Secondary | ICD-10-CM | POA: Insufficient documentation

## 2012-12-14 DIAGNOSIS — I10 Essential (primary) hypertension: Secondary | ICD-10-CM | POA: Insufficient documentation

## 2013-05-16 ENCOUNTER — Other Ambulatory Visit (HOSPITAL_COMMUNITY): Payer: Self-pay | Admitting: Rheumatology

## 2013-05-16 DIAGNOSIS — R748 Abnormal levels of other serum enzymes: Secondary | ICD-10-CM

## 2013-05-23 ENCOUNTER — Encounter (HOSPITAL_COMMUNITY)
Admission: RE | Admit: 2013-05-23 | Discharge: 2013-05-23 | Disposition: A | Discharge status: Home / Self Care | Payer: PRIVATE HEALTH INSURANCE | Source: Ambulatory Visit | Attending: Rheumatology | Admitting: Rheumatology

## 2013-05-23 DIAGNOSIS — R748 Abnormal levels of other serum enzymes: Secondary | ICD-10-CM

## 2013-05-23 MED ORDER — TECHNETIUM TC 99M MEDRONATE IV KIT: 26.0000 | PACK | Freq: Once | INTRAVENOUS | Status: DC | PRN

## 2013-05-26 ENCOUNTER — Other Ambulatory Visit: Payer: Self-pay | Admitting: Obstetrics and Gynecology

## 2013-06-13 DIAGNOSIS — D649 Anemia, unspecified: Secondary | ICD-10-CM | POA: Insufficient documentation

## 2013-06-13 DIAGNOSIS — R7303 Prediabetes: Secondary | ICD-10-CM | POA: Insufficient documentation

## 2013-06-13 DIAGNOSIS — S6991XA Unspecified injury of right wrist, hand and finger(s), initial encounter: Secondary | ICD-10-CM | POA: Insufficient documentation

## 2013-11-21 ENCOUNTER — Encounter: Payer: Self-pay | Admitting: Podiatry

## 2013-11-21 ENCOUNTER — Ambulatory Visit: Payer: Self-pay | Admitting: Podiatry

## 2013-11-21 ENCOUNTER — Ambulatory Visit (INDEPENDENT_AMBULATORY_CARE_PROVIDER_SITE_OTHER): Payer: 59

## 2013-11-21 ENCOUNTER — Ambulatory Visit (INDEPENDENT_AMBULATORY_CARE_PROVIDER_SITE_OTHER): Payer: 59 | Admitting: Podiatry

## 2013-11-21 VITALS — Ht 65.0 in | Wt 237.0 lb

## 2013-11-21 DIAGNOSIS — M79673 Pain in unspecified foot: Secondary | ICD-10-CM

## 2013-11-21 DIAGNOSIS — M722 Plantar fascial fibromatosis: Secondary | ICD-10-CM

## 2013-11-21 NOTE — Progress Notes (Signed)
   Subjective:    Patient ID: Mercedes [name], female    DOB: Jan 10, 1966, 48 y.o.   MRN: [id number]  HPI Comments: N foot pain L B/L midfoot dorsal and plantar D 6 months O on and off C aching, throbbing pain -plantar, sharp pain - dorsal A walking, pt states she sits with her feet propped up and plantar feet almost together T pt stretches and massages, cold application  Foot Pain Associated symptoms include arthralgias.   This patient presents complaining of bilateral foot pain on and off weightbearing left more painful than right primarily plantarly, however, she also describes dorsal midfoot pain.occur 3-4 days out of 7 with some days the symptoms are diminished are not present at all. She has a history of plantar fasciitis some 11 years ago in the left foot which ultimately resolved with high-energy shockwave. Leading up to the shockwave therapy she describes conservative care including foot orthotics, steroid injections and time without any improvement.  Patient has colitis and also diffuse arthralgia and was started on Humira by rheumatologistin inthe past 4 months.since then her rather diffuse joint pain has improved.  She also relates 100 pound weight gain in the last 10 years. There is a history of neuroma-like symptoms in her forefoot bilaterally which prevent her from wearing closed shoes..  Review of Systems  Musculoskeletal: Positive for back pain, arthralgias and gait problem.  All other systems reviewed and are negative.      Objective:   Physical Exam Orientated 3  Vascular: DP and PT pulses 2/4 bilaterally  Neurological: Ankle reflex equal and reactive bilaterally Knee reflex equal and reactive bilaterally Vibratory sensation intact bilaterally  Dermatological: Texture and turgor within normal limits  Musculoskeletal Palpable tenderness in the left mid arch with a 10 mm palpable hypertrophy of the medial fascial band in the left mid arch which is also  tender. Palpable tenderness in the second third intermetatarsal spaces bilaterally without any palpable lesions Palpable tenderness mid arch right without any palpable lesions Palpable tenderness in the midfoot bilaterally without any palpable lesions, bilaterally There is no restriction ankle, subtalar, midtarsal joints bilaterally  X-ray examination today demonstrated no fracture or dislocation or arthritic changes bilaterally      Assessment & Plan:   Assessment: Bilateral fasciitis left more symptomatic than the right Plantar fibromatosis left Arthralgia associated with colitis Excessive weight Suspect neuroma second and third intermetatarsal spaces bilaterally Inability to tolerate close shoes  Plan: NSAID therapy not recommended because of Humira Advised patient to reduce weight Described bent knee stretching Dispense fasciitis strap to wear in the left  If symptoms are not improving in the next several months return for further evaluation and would consider EPAT therapy as an option.

## 2013-11-21 NOTE — Patient Instructions (Signed)
Bent - Knee Calf Stretch  1) Stand an arm's length away from a wall. Place the palms of your hands on the wall. Step forward about 12 inches with the opposite foot.  2) Keeping toes pointed forward and both heels on the floor, bend both knees and lean forward. Hold this position for 60 seconds. Don't arch your back and don't hunch your shoulders.  3) Repeat this twice.  DO THIS STRETCHING TECHNIQUE 3 TIMES A DAY.   Stretching Exercises before Standing      Pull your toes up toward your nose and hold for 1 minute before standing.  A towel can assist with this exercise if you put the towel under the ball of your foot. This exercise reduces the intense    pain associated when changing from a seated to a standing position. This stretch can usually be the most beneficial if done before getting out of bed in the mornings. Plantar Fasciitis Plantar fasciitis is a common condition that causes foot pain. It is soreness (inflammation) of the band of tough fibrous tissue on the bottom of the foot that runs from the heel bone (calcaneus) to the ball of the foot. The cause of this soreness may be from excessive standing, poor fitting shoes, running on hard surfaces, being overweight, having an abnormal walk, or overuse (this is common in runners) of the painful foot or feet. It is also common in aerobic exercise dancers and ballet dancers. SYMPTOMS  Most people with plantar fasciitis complain of:  Severe pain in the morning on the bottom of their foot especially when taking the first steps out of bed. This pain recedes after a few minutes of walking.  Severe pain is experienced also during walking following a long period of inactivity.  Pain is worse when walking barefoot or up stairs DIAGNOSIS   Your caregiver will diagnose this condition by examining and feeling your foot.  Special tests such as X-rays of your foot, are usually not needed. PREVENTION   Consult a sports medicine professional  before beginning a new exercise program.  Walking programs offer a good workout. With walking there is a lower chance of overuse injuries common to runners. There is less impact and less jarring of the joints.  Begin all new exercise programs slowly. If problems or pain develop, decrease the amount of time or distance until you are at a comfortable level.  Wear good shoes and replace them regularly.  Stretch your foot and the heel cords at the back of the ankle (Achilles tendon) both before and after exercise.  Run or exercise on even surfaces that are not hard. For example, asphalt is better than pavement.  Do not run barefoot on hard surfaces.  If using a treadmill, vary the incline.  Do not continue to workout if you have foot or joint problems. Seek professional help if they do not improve. HOME CARE INSTRUCTIONS   Avoid activities that cause you pain until you recover.  Use ice or cold packs on the problem or painful areas after working out.  Only take over-the-counter or prescription medicines for pain, discomfort, or fever as directed by your caregiver.  Soft shoe inserts or athletic shoes with air or gel sole cushions may be helpful.  If problems continue or become more severe, consult a sports medicine caregiver or your own health care provider. Cortisone is a potent anti-inflammatory medication that may be injected into the painful area. You can discuss this treatment with your caregiver. MAKE  SURE YOU:   Understand these instructions.  Will watch your condition.  Will get help right away if you are not doing well or get worse. Document Released: 09/24/2000 Document Revised: 03/24/2011 Document Reviewed: 11/24/2007 [facility] Patient Information 2015 [place], [place]. This information is not intended to replace advice given to you by your health care provider. Make sure you discuss any questions you have with your health care provider.

## 2014-08-23 ENCOUNTER — Other Ambulatory Visit: Payer: Self-pay | Admitting: Obstetrics and Gynecology

## 2014-08-24 LAB — CYTOLOGY - PAP

## 2015-02-07 DIAGNOSIS — E119 Type 2 diabetes mellitus without complications: Secondary | ICD-10-CM | POA: Insufficient documentation

## 2015-02-07 DIAGNOSIS — Z23 Encounter for immunization: Secondary | ICD-10-CM | POA: Insufficient documentation

## 2015-07-12 DIAGNOSIS — Z1159 Encounter for screening for other viral diseases: Secondary | ICD-10-CM | POA: Insufficient documentation

## 2015-11-23 ENCOUNTER — Other Ambulatory Visit: Payer: Self-pay | Admitting: Rheumatology

## 2015-11-26 ENCOUNTER — Telehealth: Payer: Self-pay | Admitting: Radiology

## 2015-11-26 DIAGNOSIS — Z79631 Long term (current) use of antimetabolite agent: Secondary | ICD-10-CM

## 2015-11-26 DIAGNOSIS — Z79899 Other long term (current) drug therapy: Secondary | ICD-10-CM

## 2015-11-26 NOTE — Telephone Encounter (Signed)
Patient due for labs for MTX refill, will call her Last labs 08/05/15

## 2015-11-26 NOTE — Telephone Encounter (Addendum)
Called patient to advise. She states she will come in this week, orders put in system

## 2015-12-03 ENCOUNTER — Other Ambulatory Visit: Payer: Self-pay | Admitting: Radiology

## 2015-12-03 DIAGNOSIS — Z79899 Other long term (current) drug therapy: Secondary | ICD-10-CM

## 2015-12-03 DIAGNOSIS — Z79631 Long term (current) use of antimetabolite agent: Secondary | ICD-10-CM

## 2015-12-03 LAB — CBC WITH DIFFERENTIAL/PLATELET
BASOS ABS: 0 {cells}/uL (ref 0–200)
Basophils Relative: 0 %
EOS ABS: 180 {cells}/uL (ref 15–500)
EOS PCT: 2 %
HCT: 38.5 % (ref 35.0–45.0)
Hemoglobin: 12.7 g/dL (ref 11.7–15.5)
LYMPHS PCT: 35 %
Lymphs Abs: 3150 cells/uL (ref 850–3900)
MCH: 29.6 pg (ref 27.0–33.0)
MCHC: 33 g/dL (ref 32.0–36.0)
MCV: 89.7 fL (ref 80.0–100.0)
MONOS PCT: 6 %
MPV: 10 fL (ref 7.5–12.5)
Monocytes Absolute: 540 cells/uL (ref 200–950)
NEUTROS ABS: 5130 {cells}/uL (ref 1500–7800)
NEUTROS PCT: 57 %
PLATELETS: 322 10*3/uL (ref 140–400)
RBC: 4.29 MIL/uL (ref 3.80–5.10)
RDW: 15.2 % — AB (ref 11.0–15.0)
WBC: 9 10*3/uL (ref 3.8–10.8)

## 2015-12-03 LAB — COMPLETE METABOLIC PANEL WITH GFR
ALT: 19 U/L (ref 6–29)
AST: 18 U/L (ref 10–35)
Albumin: 4.3 g/dL (ref 3.6–5.1)
Alkaline Phosphatase: 89 U/L (ref 33–130)
BILIRUBIN TOTAL: 0.3 mg/dL (ref 0.2–1.2)
BUN: 14 mg/dL (ref 7–25)
CHLORIDE: 105 mmol/L (ref 98–110)
CO2: 25 mmol/L (ref 20–31)
Calcium: 9.4 mg/dL (ref 8.6–10.4)
Creat: 0.72 mg/dL (ref 0.50–1.05)
GLUCOSE: 98 mg/dL (ref 65–99)
POTASSIUM: 4.2 mmol/L (ref 3.5–5.3)
SODIUM: 140 mmol/L (ref 135–146)
Total Protein: 7 g/dL (ref 6.1–8.1)

## 2015-12-04 ENCOUNTER — Telehealth: Payer: Self-pay | Admitting: Radiology

## 2015-12-04 MED ORDER — METHOTREXATE 2.5 MG PO TABS: 15.0000 mg | ORAL_TABLET | ORAL | 0 refills | Status: DC

## 2015-12-04 NOTE — Telephone Encounter (Signed)
I will call patient to advise labs are normal /MTX refilled per plan

## 2015-12-04 NOTE — Telephone Encounter (Signed)
Called patient to advise  °

## 2015-12-10 ENCOUNTER — Telehealth: Payer: Self-pay | Admitting: Rheumatology

## 2015-12-10 NOTE — Telephone Encounter (Signed)
Patient left message requesting Sui to please call her. She has questions about medications that were to be called in for her.

## 2015-12-10 NOTE — Telephone Encounter (Signed)
Patient returned Amanada's phone call.

## 2015-12-10 NOTE — Telephone Encounter (Signed)
Left message for her to call me back. 

## 2015-12-11 MED ORDER — METHOTREXATE 2.5 MG PO TABS: 15.0000 mg | ORAL_TABLET | ORAL | 0 refills | Status: DC

## 2015-12-11 NOTE — Telephone Encounter (Signed)
She needed me to resend to [facility] / done

## 2015-12-12 MED ORDER — METHOTREXATE 2.5 MG PO TABS: 15.0000 mg | ORAL_TABLET | ORAL | 0 refills | Status: DC

## 2015-12-12 NOTE — Addendum Note (Signed)
Addended byCandice Camp on: 12/12/2015 04:22 PM   Modules accepted: Orders

## 2015-12-12 NOTE — Telephone Encounter (Signed)
rx error occurred resent

## 2015-12-18 DIAGNOSIS — K5190 Ulcerative colitis, unspecified, without complications: Secondary | ICD-10-CM | POA: Insufficient documentation

## 2015-12-18 DIAGNOSIS — Z79899 Other long term (current) drug therapy: Secondary | ICD-10-CM | POA: Insufficient documentation

## 2015-12-18 NOTE — Progress Notes (Signed)
Office Visit Note  Patient: Mercedes [name]             Date of Birth: 09/09/1965           MRN: [id number]             PCP: Kremer,William,M.D. Referring: No ref. provider found Visit Date: 12/20/2015 Occupation: Customer service    Subjective:  Lower back pain   History of Present Illness: Mercedes [name] is a 50 y.o. female medical history of ulcerative colitis and inflammatory arthritis. She states she ran out of methotrexate about 2 weeks ago. She had a mild flare with pain and discomfort in her right elbow which is better now. She also is having some discomfort in the SI joint area. None of the other joints are swelling or painful. She's not had any flare of ulcers colitis.  Activities of Daily Living:  Patient reports morning stiffness for 5 minutes.   Patient Denies nocturnal pain.  Difficulty dressing/grooming: Denies Difficulty climbing stairs: Reports Difficulty getting out of chair: Reports Difficulty using hands for taps, buttons, cutlery, and/or writing: Denies   Review of Systems  Constitutional: Negative for fatigue, night sweats, weight gain, weight loss and weakness.  HENT: Negative for mouth sores, trouble swallowing, trouble swallowing, mouth dryness and nose dryness.   Eyes: Negative for pain, redness, visual disturbance and dryness.  Respiratory: Negative for cough, shortness of breath and difficulty breathing.   Cardiovascular: Negative for chest pain, palpitations, hypertension, irregular heartbeat and swelling in legs/feet.  Gastrointestinal: Negative for blood in stool, constipation and diarrhea.  Endocrine: Negative for increased urination.  Genitourinary: Negative for vaginal dryness.  Musculoskeletal: Positive for arthralgias, joint pain and morning stiffness. Negative for joint swelling, myalgias, muscle weakness, muscle tenderness and myalgias.  Skin: Negative for color change, rash, hair loss, skin tightness, ulcers and sensitivity to sunlight.    Allergic/Immunologic: Negative for susceptible to infections.  Neurological: Negative for dizziness, memory loss and night sweats.  Hematological: Negative for swollen glands.  Psychiatric/Behavioral: Negative for depressed mood and sleep disturbance. The patient is not nervous/anxious.     PMFS History:  Patient Active Problem List   Diagnosis Date Noted  . Sacroiliitis ([place]) 12/19/2015  . Primary osteoarthritis of both knees 12/19/2015  . Primary osteoarthritis of both feet 12/19/2015  . Depression 12/19/2015  . Peptic ulcer disease 12/19/2015  . Hemorrhoids 12/19/2015  . S/p bilateral carpal tunnel release 12/19/2015  . Dyslipidemia 12/19/2015  . Intermittent asthma without complication 02/58/5277  . Environmental allergies 12/19/2015  . Attention deficit hyperactivity disorder (ADHD) 12/19/2015  . Ulcerative colitis ([place]) 12/18/2015  . High risk medication use 12/18/2015  . Abnormal mammogram 06/09/2012    Past Medical History:  Diagnosis Date  . Asthma   . Hypertension   . Seasonal allergies     Family History  Problem Relation Age of Onset  . Diabetes Mother   . Hypertension Mother   . Hyperlipidemia Mother   . COPD Father   . Arthritis Father    Past Surgical History:  Procedure Laterality Date  . CARPAL TUNNEL RELEASE  unsure   bilaterally  . CESAREAN SECTION  12/01  . CHOLECYSTECTOMY  12/01  . PARTIAL MASTECTOMY WITH NEEDLE LOCALIZATION Right 07/09/2012   Procedure: RIGHT PARTIAL MASTECTOMY WITH NEEDLE LOCALIZATION;  Surgeon: Adin Hector, MD;  Location: [facility];  Service: General;  Laterality: Right;  . SHOULDER OPEN ROTATOR CUFF REPAIR  2009   rt shoulder   Social  History   Social History Narrative  . No narrative on file     Objective: Vital Signs: BP 134/80   Pulse 76   Resp 14   Ht 5' 5.5" (1.664 m)   Wt 222 lb (100.7 kg)   LMP 11/05/2011   BMI 36.38 kg/m    Physical Exam  Constitutional: She is oriented to  person, place, and time. She appears well-developed and well-nourished.  HENT:  Head: Normocephalic and atraumatic.  Eyes: Conjunctivae and EOM are normal.  Neck: Normal range of motion.  Cardiovascular: Normal rate, regular rhythm, normal heart sounds and intact distal pulses.   Pulmonary/Chest: Effort normal and breath sounds normal.  Abdominal: Soft. Bowel sounds are normal.  Lymphadenopathy:    She has no cervical adenopathy.  Neurological: She is alert and oriented to person, place, and time.  Skin: Skin is warm and dry. Capillary refill takes less than 2 seconds.  Psychiatric: She has a normal mood and affect. Her behavior is normal.  Nursing note and vitals reviewed.    Musculoskeletal Exam: C-spine, thoracic, lumbar spine good range of motion she has tenderness on palpation over bilateral SI joint area. Shoulder joints, elbow joints, wrist joints are good range of motion she is some thickening of PIP/DIP joints with no synovitis in her hands. Hip joints knee joints ankles MTPs PIPs with good range of motion with no synovitis. Fibromyalgia tender points all absent.  CDAI Exam: CDAI Homunculus Exam:   Joint Counts:  CDAI Tender Joint count: 0 CDAI Swollen Joint count: 0  Global Assessments:  Patient Global Assessment: 5 Provider Global Assessment: 4  CDAI Calculated Score: 9    Investigation: Findings:  08/05/2015  TB gold negative   April 2015:  Comprehensive metabolic panel showed alkaline phosphatase is 139 which was slightly elevated.  LFTs were normal.  Sed rate was 32.  TSH, CK, hep panel, UA, SPEP, TB Gold, rheumatoid factor, CCP, ANA were all within normal limits.  IgA was slightly elevated at 513.  Orders Only on 12/03/2015  Component Date Value Ref Range Status  . WBC 12/03/2015 9.0  3.8 - 10.8 K/uL Final  . RBC 12/03/2015 4.29  3.80 - 5.10 MIL/uL Final  . Hemoglobin 12/03/2015 12.7  11.7 - 15.5 g/dL Final  . HCT 12/03/2015 38.5  35.0 - 45.0 % Final  . MCV  12/03/2015 89.7  80.0 - 100.0 fL Final  . MCH 12/03/2015 29.6  27.0 - 33.0 pg Final  . MCHC 12/03/2015 33.0  32.0 - 36.0 g/dL Final  . RDW 12/03/2015 15.2* 11.0 - 15.0 % Final  . Platelets 12/03/2015 322  140 - 400 K/uL Final  . MPV 12/03/2015 10.0  7.5 - 12.5 fL Final  . Neutro Abs 12/03/2015 5130  1,500 - 7,800 cells/uL Final  . Lymphs Abs 12/03/2015 3150  850 - 3,900 cells/uL Final  . Monocytes Absolute 12/03/2015 540  200 - 950 cells/uL Final  . Eosinophils Absolute 12/03/2015 180  15 - 500 cells/uL Final  . Basophils Absolute 12/03/2015 0  0 - 200 cells/uL Final  . Neutrophils Relative % 12/03/2015 57  % Final  . Lymphocytes Relative 12/03/2015 35  % Final  . Monocytes Relative 12/03/2015 6  % Final  . Eosinophils Relative 12/03/2015 2  % Final  . Basophils Relative 12/03/2015 0  % Final  . Smear Review 12/03/2015 Criteria for review not met   Final  . Sodium 12/03/2015 140  135 - 146 mmol/L Final  . Potassium 12/03/2015  4.2  3.5 - 5.3 mmol/L Final  . Chloride 12/03/2015 105  98 - 110 mmol/L Final  . CO2 12/03/2015 25  20 - 31 mmol/L Final  . Glucose, Bld 12/03/2015 98  65 - 99 mg/dL Final  . BUN 12/03/2015 14  7 - 25 mg/dL Final  . Creat 12/03/2015 0.72  0.50 - 1.05 mg/dL Final   Comment:   For patients > or = 52 years of age: The upper reference limit for Creatinine is approximately 13% higher for people identified as African-American.     . Total Bilirubin 12/03/2015 0.3  0.2 - 1.2 mg/dL Final  . Alkaline Phosphatase 12/03/2015 89  33 - 130 U/L Final  . AST 12/03/2015 18  10 - 35 U/L Final  . ALT 12/03/2015 19  6 - 29 U/L Final  . Total Protein 12/03/2015 7.0  6.1 - 8.1 g/dL Final  . Albumin 12/03/2015 4.3  3.6 - 5.1 g/dL Final  . Calcium 12/03/2015 9.4  8.6 - 10.4 mg/dL Final  . GFR, Est African American 12/03/2015 >89  >=60 mL/min Final  . GFR, Est Non African American 12/03/2015 >89  >=60 mL/min Final    Imaging: No results found.  Speciality Comments: No  specialty comments available.    Procedures:  No procedures performed Allergies: Tape   Assessment / Plan:     Visit Diagnoses: Ulcerative colitis - Followed up by Dr. Collene Mares. Patient has not had any flare of ulcerative colitis.  High risk medication use -she is on methotrexate 6 tablets per week, folic acid 2 mg a day, Humira every other week which she is tolerating well. She had recent refills. Her last labs from November 2017 were normal we will check labs again in 3 months and then every 3 months to monitor for drug toxicity. TB gold in July 2017 was normal. Plan: CBC with Differential/Platelet, COMPLETE METABOLIC PANEL WITH GFR  Sacroiliitis ([place]): She is having some discomfort in his stretching exercises were discussed.  Primary osteoarthritis of both knees - Bilateral mild: She may benefit from weight loss diet and exercise.  Primary osteoarthritis of both feet - Bilateral mild with calcaneal spurs: Proper fitting shoes were  discussed.  Her other medical problems are as follows for which she's seeing other doctors:  Depression, unspecified depression type  Peptic ulcer disease  Hemorrhoids, unspecified hemorrhoid type  Hx of cholecystectomy  Dyslipidemia  Intermittent asthma without complication, unspecified asthma severity  Environmental allergies  Attention deficit hyperactivity disorder (ADHD), unspecified ADHD type  S/p bilateral carpal tunnel release    Orders: Orders Placed This Encounter  Procedures  . CBC with Differential/Platelet  . COMPLETE METABOLIC PANEL WITH GFR   No orders of the defined types were placed in this encounter.   Face-to-face time spent with patient was 30 minutes. 50% of time was spent in counseling and coordination of care.  Follow-Up Instructions: Return in about 5 months (around 05/19/2016) for Inflammatory arthritis.   Bo Merino, MD

## 2015-12-19 DIAGNOSIS — M19072 Primary osteoarthritis, left ankle and foot: Secondary | ICD-10-CM

## 2015-12-19 DIAGNOSIS — Z9109 Other allergy status, other than to drugs and biological substances: Secondary | ICD-10-CM | POA: Insufficient documentation

## 2015-12-19 DIAGNOSIS — J4520 Mild intermittent asthma, uncomplicated: Secondary | ICD-10-CM | POA: Insufficient documentation

## 2015-12-19 DIAGNOSIS — E785 Hyperlipidemia, unspecified: Secondary | ICD-10-CM | POA: Insufficient documentation

## 2015-12-19 DIAGNOSIS — M461 Sacroiliitis, not elsewhere classified: Secondary | ICD-10-CM | POA: Insufficient documentation

## 2015-12-19 DIAGNOSIS — F32A Depression, unspecified: Secondary | ICD-10-CM | POA: Insufficient documentation

## 2015-12-19 DIAGNOSIS — K279 Peptic ulcer, site unspecified, unspecified as acute or chronic, without hemorrhage or perforation: Secondary | ICD-10-CM | POA: Insufficient documentation

## 2015-12-19 DIAGNOSIS — F329 Major depressive disorder, single episode, unspecified: Secondary | ICD-10-CM | POA: Insufficient documentation

## 2015-12-19 DIAGNOSIS — Z9889 Other specified postprocedural states: Secondary | ICD-10-CM | POA: Insufficient documentation

## 2015-12-19 DIAGNOSIS — K649 Unspecified hemorrhoids: Secondary | ICD-10-CM | POA: Insufficient documentation

## 2015-12-19 DIAGNOSIS — F909 Attention-deficit hyperactivity disorder, unspecified type: Secondary | ICD-10-CM | POA: Insufficient documentation

## 2015-12-19 DIAGNOSIS — M19071 Primary osteoarthritis, right ankle and foot: Secondary | ICD-10-CM | POA: Insufficient documentation

## 2015-12-19 DIAGNOSIS — M170 Bilateral primary osteoarthritis of knee: Secondary | ICD-10-CM | POA: Insufficient documentation

## 2015-12-20 ENCOUNTER — Encounter: Payer: Self-pay | Admitting: Rheumatology

## 2015-12-20 ENCOUNTER — Ambulatory Visit (INDEPENDENT_AMBULATORY_CARE_PROVIDER_SITE_OTHER): Payer: 59 | Admitting: Rheumatology

## 2015-12-20 VITALS — BP 134/80 | HR 76 | Resp 14 | Ht 65.5 in | Wt 222.0 lb

## 2015-12-20 DIAGNOSIS — M461 Sacroiliitis, not elsewhere classified: Secondary | ICD-10-CM | POA: Diagnosis not present

## 2015-12-20 DIAGNOSIS — M170 Bilateral primary osteoarthritis of knee: Secondary | ICD-10-CM | POA: Diagnosis not present

## 2015-12-20 DIAGNOSIS — M19072 Primary osteoarthritis, left ankle and foot: Secondary | ICD-10-CM

## 2015-12-20 DIAGNOSIS — Z9109 Other allergy status, other than to drugs and biological substances: Secondary | ICD-10-CM | POA: Diagnosis not present

## 2015-12-20 DIAGNOSIS — Z9889 Other specified postprocedural states: Secondary | ICD-10-CM

## 2015-12-20 DIAGNOSIS — Z9049 Acquired absence of other specified parts of digestive tract: Secondary | ICD-10-CM

## 2015-12-20 DIAGNOSIS — K5180 Other ulcerative colitis without complications: Secondary | ICD-10-CM

## 2015-12-20 DIAGNOSIS — E785 Hyperlipidemia, unspecified: Secondary | ICD-10-CM

## 2015-12-20 DIAGNOSIS — Z79899 Other long term (current) drug therapy: Secondary | ICD-10-CM

## 2015-12-20 DIAGNOSIS — M19071 Primary osteoarthritis, right ankle and foot: Secondary | ICD-10-CM | POA: Diagnosis not present

## 2015-12-20 DIAGNOSIS — F909 Attention-deficit hyperactivity disorder, unspecified type: Secondary | ICD-10-CM

## 2015-12-20 DIAGNOSIS — F329 Major depressive disorder, single episode, unspecified: Secondary | ICD-10-CM | POA: Diagnosis not present

## 2015-12-20 DIAGNOSIS — F32A Depression, unspecified: Secondary | ICD-10-CM

## 2015-12-20 DIAGNOSIS — J4520 Mild intermittent asthma, uncomplicated: Secondary | ICD-10-CM

## 2015-12-20 DIAGNOSIS — K649 Unspecified hemorrhoids: Secondary | ICD-10-CM | POA: Diagnosis not present

## 2015-12-20 DIAGNOSIS — K279 Peptic ulcer, site unspecified, unspecified as acute or chronic, without hemorrhage or perforation: Secondary | ICD-10-CM

## 2015-12-20 DIAGNOSIS — F4329 Adjustment disorder with other symptoms: Secondary | ICD-10-CM | POA: Insufficient documentation

## 2015-12-20 NOTE — Patient Instructions (Signed)
Standing Labs We placed an order today for your standing lab work.    Please come back and get your standing labs in February and every 3 months  We have open lab Monday through Friday from 8:30-11:30 AM and 1-4 PM at the office of Dr. Tresa Moore, PA.   The office is located at [address] [place], [place], [place] [address] No appointment is necessary.   Labs are drawn by [facility].  You may receive a bill from [facility] for your lab work.

## 2016-01-09 DIAGNOSIS — L0100 Impetigo, unspecified: Secondary | ICD-10-CM | POA: Insufficient documentation

## 2016-01-29 ENCOUNTER — Other Ambulatory Visit: Payer: Self-pay | Admitting: Obstetrics and Gynecology

## 2016-01-29 ENCOUNTER — Ambulatory Visit: Payer: 59 | Admitting: Podiatry

## 2016-01-29 DIAGNOSIS — E119 Type 2 diabetes mellitus without complications: Secondary | ICD-10-CM | POA: Insufficient documentation

## 2016-01-30 ENCOUNTER — Other Ambulatory Visit: Payer: Self-pay | Admitting: Rheumatology

## 2016-02-01 ENCOUNTER — Other Ambulatory Visit: Payer: Self-pay | Admitting: Obstetrics and Gynecology

## 2016-02-01 DIAGNOSIS — R928 Other abnormal and inconclusive findings on diagnostic imaging of breast: Secondary | ICD-10-CM

## 2016-02-01 LAB — CYTOLOGY - PAP

## 2016-02-06 ENCOUNTER — Ambulatory Visit
Admission: RE | Admit: 2016-02-06 | Discharge: 2016-02-06 | Disposition: A | Discharge status: Home / Self Care | Payer: PRIVATE HEALTH INSURANCE | Source: Ambulatory Visit | Attending: Obstetrics and Gynecology | Admitting: Obstetrics and Gynecology

## 2016-02-06 DIAGNOSIS — R928 Other abnormal and inconclusive findings on diagnostic imaging of breast: Secondary | ICD-10-CM

## 2016-02-19 ENCOUNTER — Other Ambulatory Visit: Payer: Self-pay | Admitting: *Deleted

## 2016-02-19 DIAGNOSIS — Z79899 Other long term (current) drug therapy: Secondary | ICD-10-CM

## 2016-02-19 DIAGNOSIS — Z79631 Long term (current) use of antimetabolite agent: Secondary | ICD-10-CM

## 2016-02-19 LAB — COMPLETE METABOLIC PANEL WITH GFR
ALT: 28 U/L (ref 6–29)
AST: 26 U/L (ref 10–35)
Albumin: 4.1 g/dL (ref 3.6–5.1)
Alkaline Phosphatase: 87 U/L (ref 33–130)
BUN: 16 mg/dL (ref 7–25)
CO2: 28 mmol/L (ref 20–31)
CREATININE: 0.89 mg/dL (ref 0.50–1.05)
Calcium: 9.4 mg/dL (ref 8.6–10.4)
Chloride: 104 mmol/L (ref 98–110)
GFR, EST AFRICAN AMERICAN: 86 mL/min (ref >=60)
GFR, Est Non African American: 75 mL/min (ref >=60)
Glucose, Bld: 98 mg/dL (ref 65–99)
Potassium: 4.5 mmol/L (ref 3.5–5.3)
Sodium: 139 mmol/L (ref 135–146)
Total Bilirubin: 0.4 mg/dL (ref 0.2–1.2)
Total Protein: 6.9 g/dL (ref 6.1–8.1)

## 2016-02-19 LAB — CBC WITH DIFFERENTIAL/PLATELET
BASOS PCT: 0 %
Basophils Absolute: 0 cells/uL (ref 0–200)
Eosinophils Absolute: 166 cells/uL (ref 15–500)
Eosinophils Relative: 2 %
HCT: 38.9 % (ref 35.0–45.0)
Hemoglobin: 12.7 g/dL (ref 11.7–15.5)
LYMPHS PCT: 37 %
Lymphs Abs: 3071 cells/uL (ref 850–3900)
MCH: 29.1 pg (ref 27.0–33.0)
MCHC: 32.6 g/dL (ref 32.0–36.0)
MCV: 89.2 fL (ref 80.0–100.0)
MONOS PCT: 9 %
MPV: 10.3 fL (ref 7.5–12.5)
Monocytes Absolute: 747 cells/uL (ref 200–950)
Neutro Abs: 4316 cells/uL (ref 1500–7800)
Neutrophils Relative %: 52 %
PLATELETS: 312 10*3/uL (ref 140–400)
RBC: 4.36 MIL/uL (ref 3.80–5.10)
RDW: 14.1 % (ref 11.0–15.0)
WBC: 8.3 10*3/uL (ref 3.8–10.8)

## 2016-03-06 ENCOUNTER — Other Ambulatory Visit: Payer: Self-pay | Admitting: Rheumatology

## 2016-03-07 NOTE — Telephone Encounter (Signed)
ok 

## 2016-03-07 NOTE — Telephone Encounter (Signed)
Last Visit: 12/20/15 Next Visit: 05/19/16 Labs: 02/19/16 WNL  Okay to refill MTX?

## 2016-05-14 ENCOUNTER — Other Ambulatory Visit: Payer: Self-pay

## 2016-05-14 DIAGNOSIS — Z79899 Other long term (current) drug therapy: Secondary | ICD-10-CM

## 2016-05-14 DIAGNOSIS — Z79631 Long term (current) use of antimetabolite agent: Secondary | ICD-10-CM

## 2016-05-14 LAB — CBC WITH DIFFERENTIAL/PLATELET
Basophils Absolute: 0 cells/uL (ref 0–200)
Basophils Relative: 0 %
Eosinophils Absolute: 176 cells/uL (ref 15–500)
Eosinophils Relative: 2 %
HCT: 36.9 % (ref 35.0–45.0)
Hemoglobin: 12.2 g/dL (ref 11.7–15.5)
Lymphocytes Relative: 40 %
Lymphs Abs: 3520 cells/uL (ref 850–3900)
MCH: 30 pg (ref 27.0–33.0)
MCHC: 33.1 g/dL (ref 32.0–36.0)
MCV: 90.7 fL (ref 80.0–100.0)
MONO ABS: 792 {cells}/uL (ref 200–950)
MONOS PCT: 9 %
MPV: 9.6 fL (ref 7.5–12.5)
Neutro Abs: 4312 cells/uL (ref 1500–7800)
Neutrophils Relative %: 49 %
PLATELETS: 290 10*3/uL (ref 140–400)
RBC: 4.07 MIL/uL (ref 3.80–5.10)
RDW: 15.4 % — ABNORMAL HIGH (ref 11.0–15.0)
WBC: 8.8 10*3/uL (ref 3.8–10.8)

## 2016-05-14 LAB — COMPLETE METABOLIC PANEL WITH GFR
ALBUMIN: 3.9 g/dL (ref 3.6–5.1)
ALK PHOS: 86 U/L (ref 33–130)
ALT: 25 U/L (ref 6–29)
AST: 19 U/L (ref 10–35)
BUN: 17 mg/dL (ref 7–25)
CALCIUM: 9.2 mg/dL (ref 8.6–10.4)
CHLORIDE: 104 mmol/L (ref 98–110)
CO2: 25 mmol/L (ref 20–31)
Creat: 0.89 mg/dL (ref 0.50–1.05)
GFR, EST AFRICAN AMERICAN: 86 mL/min (ref >=60)
GFR, EST NON AFRICAN AMERICAN: 75 mL/min (ref >=60)
Glucose, Bld: 97 mg/dL (ref 65–99)
POTASSIUM: 4.6 mmol/L (ref 3.5–5.3)
Sodium: 139 mmol/L (ref 135–146)
Total Bilirubin: 0.3 mg/dL (ref 0.2–1.2)
Total Protein: 6.9 g/dL (ref 6.1–8.1)

## 2016-05-15 NOTE — Progress Notes (Signed)
WNL

## 2016-05-19 ENCOUNTER — Ambulatory Visit: Payer: 59 | Admitting: Rheumatology

## 2016-05-20 NOTE — Progress Notes (Signed)
Office Visit Note  Patient: Mercedes [name]             Date of Birth: 1965/08/28           MRN: [id number]             PCP: Libby Maw, MD Referring: Libby Maw,* Visit Date: 05/21/2016 Occupation: _0 @    Subjective:  Follow-up   History of Present Illness: Mercedes [name] is a 51 y.o. female  Last seen 12/20/2015 Dr. Estanislado Pandy. At last visit -===>History of Present Illness: Mercedes [name] is a 51 y.o. female medical history of ulcerative colitis and inflammatory arthritis. She states she ran out of methotrexate about 2 weeks ago. She had a mild flare with pain and discomfort in her right elbow which is better now. She also is having some discomfort in the SI joint area. None of the other joints are swelling or painful. She's not had any flare of ulcers colitis.   Today ==> patient was doing really well up until April 2018. She started noticing a few joint pain to various joints in her body the first and second week of April and it gradually progressed by the time fourth week of April came around, she is having significant amount of joint pain. As of first week of May, patient feels that most of her joints are achy and painful and she feels that her pain is rated about 5 on a scale of 0-10. She has stiffness that lasts all morning long and it was not this way in the past. She has been happy with her Humira every 2 weeks (that she uses for ulcerative colitis as well as inflammatory arthritis). (As well as doing well with methotrexate 2.5 mg, 6 pills per week and folic acid 2 mg daily). In the past this dual therapy gave her adequate response but she questions how effective this medication is especially the first week of May since she's been hurting so much. It is important to note that patient has not missed any doses of her medications and she is compliant with her labs and meds. Her ulcerative colitis is responding well to her treatment. Her main complaint is that her  joints are hurting since about the last 10 days.   Patient has a history of ulcerative colitis for which she sees Dr. Collene Mares.  Using Humira every 2 weeks. And methotrexate 2.5 mg, 6 pills every week; folic acid 2 mg daily.    Activities of Daily Living:  Patient reports morning stiffness for 24 hours.   Patient Reports nocturnal pain.  Difficulty dressing/grooming: Reports Difficulty climbing stairs: Reports Difficulty getting out of chair: Reports Difficulty using hands for taps, buttons, cutlery, and/or writing: Denies   Review of Systems  Constitutional: Positive for fatigue.  HENT: Negative for mouth sores and mouth dryness.   Eyes: Negative for dryness.  Respiratory: Negative for shortness of breath.   Gastrointestinal: Negative for constipation and diarrhea.  Musculoskeletal: Positive for myalgias and myalgias.  Skin: Negative for sensitivity to sunlight.  Psychiatric/Behavioral: Positive for sleep disturbance. Negative for decreased concentration.    PMFS History:  Patient Active Problem List   Diagnosis Date Noted  . Sacroiliitis ([place]) 12/19/2015  . Primary osteoarthritis of both knees 12/19/2015  . Primary osteoarthritis of both feet 12/19/2015  . Depression 12/19/2015  . Peptic ulcer disease 12/19/2015  . Hemorrhoids 12/19/2015  . S/p bilateral carpal tunnel release 12/19/2015  . Dyslipidemia 12/19/2015  . Intermittent asthma without  complication 22/44/9753  . Environmental allergies 12/19/2015  . Attention deficit hyperactivity disorder (ADHD) 12/19/2015  . Ulcerative colitis ([place]) 12/18/2015  . High risk medication use 12/18/2015  . Abnormal mammogram 06/09/2012    Past Medical History:  Diagnosis Date  . Asthma   . Hypertension   . Seasonal allergies     Family History  Problem Relation Age of Onset  . Diabetes Mother   . Hypertension Mother   . Hyperlipidemia Mother   . COPD Father   . Arthritis Father    Past Surgical History:  Procedure  Laterality Date  . CARPAL TUNNEL RELEASE  unsure   bilaterally  . CESAREAN SECTION  12/01  . CHOLECYSTECTOMY  12/01  . PARTIAL MASTECTOMY WITH NEEDLE LOCALIZATION Right 07/09/2012   Procedure: RIGHT PARTIAL MASTECTOMY WITH NEEDLE LOCALIZATION;  Surgeon: Adin Hector, MD;  Location: [facility];  Service: General;  Laterality: Right;  . SHOULDER OPEN ROTATOR CUFF REPAIR  2009   rt shoulder   Social History   Social History Narrative  . No narrative on file     Objective: Vital Signs: BP 117/73 (BP Location: Left Arm, Patient Position: Sitting, Cuff Size: Normal)   Pulse 89   Resp 16   Ht _0  (1.651 m)   Wt 227 lb (103 kg)   LMP 05/19/2012   BMI 37.77 kg/m    Physical Exam  Constitutional: She is oriented to person, place, and time. She appears well-developed and well-nourished.  HENT:  Head: Normocephalic and atraumatic.  Eyes: EOM are normal. Pupils are equal, round, and reactive to light.  Cardiovascular: Normal rate, regular rhythm and normal heart sounds.  Exam reveals no gallop and no friction rub.   No murmur heard. Pulmonary/Chest: Effort normal and breath sounds normal. She has no wheezes. She has no rales.  Abdominal: Soft. Bowel sounds are normal. She exhibits no distension. There is no tenderness. There is no guarding. No hernia.  Musculoskeletal: Normal range of motion. She exhibits no edema, tenderness or deformity.  Lymphadenopathy:    She has no cervical adenopathy.  Neurological: She is alert and oriented to person, place, and time. Coordination normal.  Skin: Skin is warm and dry. Capillary refill takes less than 2 seconds. No rash noted.  Psychiatric: She has a normal mood and affect. Her behavior is normal.  Nursing note and vitals reviewed.    Musculoskeletal Exam:  Full range of motion of all joints Grip strength is equal and strong bilaterally Fibromyalgia tender points are 12 out of 18 positive, (new finding. No history of this  in the past.)  CDAI Exam: No CDAI exam completed.  No synovitis on examination. (Examined by Dr. Estanislado Pandy also. She did not find synovitis either.)  Investigation: Findings:  08/03/2015 negative TB gold   CBC Latest Ref Rng & Units 05/14/2016 02/19/2016 12/03/2015  WBC 3.8 - 10.8 K/uL 8.8 8.3 9.0  Hemoglobin 11.7 - 15.5 g/dL 12.2 12.7 12.7  Hematocrit 35.0 - 45.0 % 36.9 38.9 38.5  Platelets 140 - 400 K/uL 290 312 322   CMP Latest Ref Rng & Units 05/14/2016 02/19/2016 12/03/2015  Glucose 65 - 99 mg/dL 97 98 98  BUN 7 - 25 mg/dL _1 Creatinine 0.50 - 1.05 mg/dL 0.89 0.89 0.72  Sodium 135 - 146 mmol/L 139 139 140  Potassium 3.5 - 5.3 mmol/L 4.6 4.5 4.2  Chloride 98 - 110 mmol/L 104 104 105  CO2 20 - 31 mmol/L 25 28 25  Calcium 8.6 - 10.4 mg/dL 9.2 9.4 9.4  Total Protein 6.1 - 8.1 g/dL 6.9 6.9 7.0  Total Bilirubin 0.2 - 1.2 mg/dL 0.3 0.4 0.3  Alkaline Phos 33 - 130 U/L 86 87 89  AST 10 - 35 U/L _0 ALT 6 - 29 U/L _1 Imaging: No results found.  Speciality Comments: No specialty comments available.    Procedures:  No procedures performed Allergies: Tape   Assessment / Plan:     Visit Diagnoses: Other ulcerative colitis without complication ([place]) - Plan: Sedimentation rate  Primary osteoarthritis of both feet  High risk medication use - Plan: Sedimentation rate  Primary osteoarthritis of both knees  Sacroiliitis ([facility])  S/p bilateral carpal tunnel release  History of depression  History of hyperlipidemia  History of asthma   Plan: #1: History of ulcerative colitis. Well controlled with combination of Humira and methotrexate. Although we prescribed the above 2 medications, she is comanage with Dr. Martin Majestic demand, gastroenterologist  #2: Inflammatory arthritis. Well-controlled with Humira and methotrexate until approximately 7-10 days ago. Patient states: I have pain to all of my joints and I don't think the Humira/MTX are working  anymore. Note: No synovitis on examination. Joints were examined by myself as well as Dr. Estanislado Pandy and she did not find any synovitis. Plan: Schedule ultrasound of bilateral feet and ankles at the next available and we will office visit the same time. Note: We will do ultrasound of the feet and ankles because patient feels that feet are "broken". Finds it very difficult to walk on her feet at times. Her left foot is worse than the right foot.  #3: High risk prescription. Humira every 2 weeks Methotrexate 6 pills per week Folic acid 2 mg per day  #4: Possible fibromyalgia syndrome. Increased pain to various joints and muscle insertion areas over the last 7-10 days. Patient rates her pain about 5 on a scale of 0-10. No previous history of same. Having trouble sleeping and with fatigue. Never feels well rested. Has frequent interruptions in her sleep secondary to pain. 12 out of 18 tender points including bilateral greater trochanter bursa, bilateral SI joints, bilateral medial knees, and all 3 areas of the clavicular area.   #5: Bilateral feet pain with left worsen). Patient states "I feel like my left foot is broken at times".  #6: Continue Humira every 2 weeks as prescribed, methotrexate 6 pills per week as prescribed, folic acid 2 mg every day as prescribed.  #7: I encouraged the patient to start water aerobics if she can. She is agreeable. She has a [employer] nearby and she's been wanting to start exercising anyway.  #8: Return to clinic in 2 months for ultrasound of bilateral feet and ankles  #9: Labs: Her recent labs are up-to-date and today, I will order sedimentation rate. Her TB goal will be due July 2018.    Orders: Orders Placed This Encounter  Procedures  . Sedimentation rate   No orders of the defined types were placed in this encounter.   Face-to-face time spent with patient was 30 minutes. 50% of time was spent in counseling and coordination of care.  Follow-Up  Instructions: Return in about 2 months (around 07/21/2016) for inflam arthritis//U.C.// hUMIRA Q 2 WKS// MTX 6/WK// FOLIC 2MG QD//.   Hal Norrington, PA-C  Pt. Had no synovitis on exam today. As she is having increased pain we will check her ESR and sch [place] of bilateral feet.I  examined and evaluated the patient with Eliezer Lofts PA. The plan of care was discussed as noted above.  Bo Merino, MD  Note - This record has been created using Editor, commissioning.  Chart creation errors have been sought, but may not always  have been located. Such creation errors do not reflect on  the standard of medical care.

## 2016-05-21 ENCOUNTER — Ambulatory Visit (INDEPENDENT_AMBULATORY_CARE_PROVIDER_SITE_OTHER): Payer: 59 | Admitting: Rheumatology

## 2016-05-21 ENCOUNTER — Encounter: Payer: Self-pay | Admitting: Rheumatology

## 2016-05-21 VITALS — BP 117/73 | HR 89 | Resp 16 | Ht 65.0 in | Wt 227.0 lb

## 2016-05-21 DIAGNOSIS — M461 Sacroiliitis, not elsewhere classified: Secondary | ICD-10-CM

## 2016-05-21 DIAGNOSIS — M19071 Primary osteoarthritis, right ankle and foot: Secondary | ICD-10-CM | POA: Diagnosis not present

## 2016-05-21 DIAGNOSIS — Z8709 Personal history of other diseases of the respiratory system: Secondary | ICD-10-CM

## 2016-05-21 DIAGNOSIS — Z8659 Personal history of other mental and behavioral disorders: Secondary | ICD-10-CM

## 2016-05-21 DIAGNOSIS — M19072 Primary osteoarthritis, left ankle and foot: Secondary | ICD-10-CM

## 2016-05-21 DIAGNOSIS — Z8639 Personal history of other endocrine, nutritional and metabolic disease: Secondary | ICD-10-CM

## 2016-05-21 DIAGNOSIS — M170 Bilateral primary osteoarthritis of knee: Secondary | ICD-10-CM | POA: Diagnosis not present

## 2016-05-21 DIAGNOSIS — K5180 Other ulcerative colitis without complications: Secondary | ICD-10-CM

## 2016-05-21 DIAGNOSIS — Z9889 Other specified postprocedural states: Secondary | ICD-10-CM | POA: Diagnosis not present

## 2016-05-21 DIAGNOSIS — Z79899 Other long term (current) drug therapy: Secondary | ICD-10-CM

## 2016-05-22 LAB — SEDIMENTATION RATE: SED RATE: 11 mm/h (ref 0–30)

## 2016-05-22 NOTE — Progress Notes (Signed)
Pls advise pt that sed rate was normal.

## 2016-05-23 ENCOUNTER — Other Ambulatory Visit: Payer: Self-pay | Admitting: Rheumatology

## 2016-05-23 NOTE — Telephone Encounter (Signed)
ok 

## 2016-05-23 NOTE — Telephone Encounter (Signed)
Last Visit: 05/21/16 Next Visit: 07/30/16 Labs: 05/14/16 WNL TB Gold: 08/04/16 Neg  Okay to refill Humira?

## 2016-06-02 ENCOUNTER — Other Ambulatory Visit: Payer: Self-pay | Admitting: Rheumatology

## 2016-06-02 MED ORDER — METHOTREXATE 2.5 MG PO TABS: 15.0000 mg | ORAL_TABLET | ORAL | 0 refills | Status: DC

## 2016-06-02 NOTE — Telephone Encounter (Signed)
Patient request rf on her MTX. Patient uses [facility]. Please call patient when called in to pharmacy.

## 2016-06-02 NOTE — Telephone Encounter (Signed)
Last Visit: 05/21/16 Next Visit: 07/30/16 Labs: 05/14/16 WNL  Okay to refill MTX?

## 2016-06-02 NOTE — Telephone Encounter (Signed)
ok 

## 2016-07-29 NOTE — Progress Notes (Deleted)
Visit Diagnoses: Other ulcerative colitis without complication ([place]) - Plan: Sedimentation rate  Primary osteoarthritis of both feet  High risk medication use - Plan: Sedimentation rate  Primary osteoarthritis of both knees  Sacroiliitis ([facility])  S/p bilateral carpal tunnel release  History of depression  History of hyperlipidemia  History of asthma   Plan: #1: History of ulcerative colitis. Well controlled with combination of Humira and methotrexate. Although we prescribed the above 2 medications, she is comanage with Dr. Martin Majestic demand, gastroenterologist  #2: Inflammatory arthritis. Well-controlled with Humira and methotrexate until approximately 7-10 days ago. Patient states: I have pain to all of my joints and I don't think the Humira/MTX are working anymore. Note: No synovitis on examination. Joints were examined by myself as well as Dr. Estanislado Pandy and she did not find any synovitis. Plan: Schedule ultrasound of bilateral feet and ankles at the next available and we will office visit the same time. Note: We will do ultrasound of the feet and ankles because patient feels that feet are "broken". Finds it very difficult to walk on her feet at times. Her left foot is worse than the right foot.  #3: High risk prescription. Humira every 2 weeks Methotrexate 6 pills per week Folic acid 2 mg per day  #4: Possible fibromyalgia syndrome. Increased pain to various joints and muscle insertion areas over the last 7-10 days. Patient rates her pain about 5 on a scale of 0-10. No previous history of same. Having trouble sleeping and with fatigue. Never feels well rested. Has frequent interruptions in her sleep secondary to pain. 12 out of 18 tender points including bilateral greater trochanter bursa, bilateral SI joints, bilateral medial knees, and all 3 areas of the clavicular area.   #5: Bilateral feet pain with left worsen). Patient states "I feel like my left foot is  broken at times".  #6: Continue Humira every 2 weeks as prescribed, methotrexate 6 pills per week as prescribed, folic acid 2 mg every day as prescribed.  #7: I encouraged the patient to start water aerobics if she can. She is agreeable. She has a [employer] nearby and she's been wanting to start exercising anyway.  #8: Return to clinic in 2 months for ultrasound of bilateral feet and ankles

## 2016-07-30 ENCOUNTER — Inpatient Hospital Stay (INDEPENDENT_AMBULATORY_CARE_PROVIDER_SITE_OTHER): Payer: 59

## 2016-07-30 ENCOUNTER — Ambulatory Visit (INDEPENDENT_AMBULATORY_CARE_PROVIDER_SITE_OTHER): Payer: 59 | Admitting: Rheumatology

## 2016-07-30 ENCOUNTER — Encounter: Payer: Self-pay | Admitting: Rheumatology

## 2016-07-30 VITALS — BP 104/67 | HR 85 | Resp 16 | Ht 65.0 in | Wt 232.0 lb

## 2016-07-30 DIAGNOSIS — Z9889 Other specified postprocedural states: Secondary | ICD-10-CM | POA: Diagnosis not present

## 2016-07-30 DIAGNOSIS — M170 Bilateral primary osteoarthritis of knee: Secondary | ICD-10-CM

## 2016-07-30 DIAGNOSIS — M79671 Pain in right foot: Secondary | ICD-10-CM

## 2016-07-30 DIAGNOSIS — M19071 Primary osteoarthritis, right ankle and foot: Secondary | ICD-10-CM

## 2016-07-30 DIAGNOSIS — M461 Sacroiliitis, not elsewhere classified: Secondary | ICD-10-CM | POA: Diagnosis not present

## 2016-07-30 DIAGNOSIS — M19072 Primary osteoarthritis, left ankle and foot: Secondary | ICD-10-CM

## 2016-07-30 DIAGNOSIS — K5180 Other ulcerative colitis without complications: Secondary | ICD-10-CM

## 2016-07-30 DIAGNOSIS — M79672 Pain in left foot: Secondary | ICD-10-CM | POA: Diagnosis not present

## 2016-07-30 DIAGNOSIS — Z8709 Personal history of other diseases of the respiratory system: Secondary | ICD-10-CM

## 2016-07-30 DIAGNOSIS — Z8711 Personal history of peptic ulcer disease: Secondary | ICD-10-CM | POA: Diagnosis not present

## 2016-07-30 DIAGNOSIS — Z9049 Acquired absence of other specified parts of digestive tract: Secondary | ICD-10-CM | POA: Insufficient documentation

## 2016-07-30 DIAGNOSIS — Z79899 Other long term (current) drug therapy: Secondary | ICD-10-CM | POA: Diagnosis not present

## 2016-07-30 DIAGNOSIS — Z8659 Personal history of other mental and behavioral disorders: Secondary | ICD-10-CM | POA: Diagnosis not present

## 2016-07-30 LAB — CBC WITH DIFFERENTIAL/PLATELET
BASOS ABS: 0 {cells}/uL (ref 0–200)
Basophils Relative: 0 %
EOS ABS: 178 {cells}/uL (ref 15–500)
Eosinophils Relative: 2 %
HEMATOCRIT: 37.3 % (ref 35.0–45.0)
Hemoglobin: 12.2 g/dL (ref 11.7–15.5)
LYMPHS ABS: 3115 {cells}/uL (ref 850–3900)
Lymphocytes Relative: 35 %
MCH: 30 pg (ref 27.0–33.0)
MCHC: 32.7 g/dL (ref 32.0–36.0)
MCV: 91.6 fL (ref 80.0–100.0)
MPV: 10 fL (ref 7.5–12.5)
Monocytes Absolute: 801 cells/uL (ref 200–950)
Monocytes Relative: 9 %
NEUTROS ABS: 4806 {cells}/uL (ref 1500–7800)
NEUTROS PCT: 54 %
Platelets: 293 10*3/uL (ref 140–400)
RBC: 4.07 MIL/uL (ref 3.80–5.10)
RDW: 14 % (ref 11.0–15.0)
WBC: 8.9 10*3/uL (ref 3.8–10.8)

## 2016-07-30 LAB — COMPLETE METABOLIC PANEL WITH GFR
ALBUMIN: 4.1 g/dL (ref 3.6–5.1)
ALT: 36 U/L — ABNORMAL HIGH (ref 6–29)
AST: 25 U/L (ref 10–35)
Alkaline Phosphatase: 97 U/L (ref 33–130)
BUN: 16 mg/dL (ref 7–25)
CO2: 27 mmol/L (ref 20–31)
Calcium: 9.3 mg/dL (ref 8.6–10.4)
Chloride: 104 mmol/L (ref 98–110)
Creat: 0.81 mg/dL (ref 0.50–1.05)
GFR, EST NON AFRICAN AMERICAN: 84 mL/min (ref >=60)
GFR, Est African American: >89 mL/min (ref >=60)
GLUCOSE: 90 mg/dL (ref 65–99)
POTASSIUM: 4.2 mmol/L (ref 3.5–5.3)
SODIUM: 140 mmol/L (ref 135–146)
TOTAL PROTEIN: 6.9 g/dL (ref 6.1–8.1)
Total Bilirubin: 0.3 mg/dL (ref 0.2–1.2)

## 2016-07-30 NOTE — Patient Instructions (Signed)
Standing Labs We placed an order today for your standing lab work.    Please come back and get your standing labs in October 2018 and every 3 months.  We have open lab Monday through Friday from 8:30-11:30 AM and 1:30-4 PM at the office of Dr. Bo Merino.   The office is located at [address] [place], [place],  [address] No appointment is necessary.   Labs are drawn by [facility].  You may receive a bill from [facility] for your lab work. If you have any questions regarding directions or hours of operation,  please call [phone].

## 2016-07-30 NOTE — Progress Notes (Signed)
Office Visit Note  Patient: Mercedes [name]             Date of Birth: [birth year]           MRN: [id number]             PCP: Libby Maw, MD Referring: Libby Maw,* Visit Date: 07/30/2016 Occupation: @GUAROCC @    Subjective:  Medication Management   History of Present Illness: Mercedes [name] is a 51 y.o. female with history of ulcerative colitis and osteoarthritis. She states overall her symptoms have improved compared to last visit. She continues to have some lower back pain. Her feet is still have some discomfort but better than before. Knee joint pain is tolerable. I have  Activities of Daily Living:  Patient reports morning stiffness for 45 minutes.   Patient Denies nocturnal pain.  Difficulty dressing/grooming: Denies Difficulty climbing stairs: Reports Difficulty getting out of chair: Reports Difficulty using hands for taps, buttons, cutlery, and/or writing: Denies   Review of Systems  Constitutional: Positive for fatigue. Negative for night sweats, weight gain, weight loss and weakness.  HENT: Negative for mouth sores, trouble swallowing, trouble swallowing, mouth dryness and nose dryness.   Eyes: Negative for pain, redness, visual disturbance and dryness.  Respiratory: Negative for cough, shortness of breath and difficulty breathing.   Cardiovascular: Negative for chest pain, palpitations, hypertension, irregular heartbeat and swelling in legs/feet.  Gastrointestinal: Negative for blood in stool, constipation and diarrhea.  Endocrine: Negative for increased urination.  Genitourinary: Negative for vaginal dryness.  Musculoskeletal: Positive for arthralgias, joint pain and morning stiffness. Negative for joint swelling, myalgias, muscle weakness, muscle tenderness and myalgias.  Skin: Negative for color change, rash, hair loss, skin tightness, ulcers and sensitivity to sunlight.  Allergic/Immunologic: Negative for susceptible to infections.    Neurological: Negative for dizziness, memory loss and night sweats.  Hematological: Negative for swollen glands.  Psychiatric/Behavioral: Negative for depressed mood and sleep disturbance. The patient is not nervous/anxious.     PMFS History:  Patient Active Problem List   Diagnosis Date Noted  . History of cholecystectomy 07/30/2016  . Sacroiliitis ([place]) 12/19/2015  . Primary osteoarthritis of both knees 12/19/2015  . Primary osteoarthritis of both feet 12/19/2015  . Depression 12/19/2015  . Peptic ulcer disease 12/19/2015  . Hemorrhoids 12/19/2015  . S/p bilateral carpal tunnel release 12/19/2015  . Dyslipidemia 12/19/2015  . Intermittent asthma without complication 14/78/2956  . Environmental allergies 12/19/2015  . Attention deficit hyperactivity disorder (ADHD) 12/19/2015  . Ulcerative colitis ([place]) 12/18/2015  . High risk medication use 12/18/2015  . Abnormal mammogram 06/09/2012    Past Medical History:  Diagnosis Date  . Asthma   . Hypertension   . Seasonal allergies     Family History  Problem Relation Age of Onset  . Diabetes Mother   . Hypertension Mother   . Hyperlipidemia Mother   . COPD Father   . Arthritis Father    Past Surgical History:  Procedure Laterality Date  . CARPAL TUNNEL RELEASE  unsure   bilaterally  . CESAREAN SECTION  12/01  . CHOLECYSTECTOMY  12/01  . PARTIAL MASTECTOMY WITH NEEDLE LOCALIZATION Right 07/09/2012   Procedure: RIGHT PARTIAL MASTECTOMY WITH NEEDLE LOCALIZATION;  Surgeon: Adin Hector, MD;  Location: [facility];  Service: General;  Laterality: Right;  . SHOULDER OPEN ROTATOR CUFF REPAIR  2009   rt shoulder   Social History   Social History Narrative  . No narrative on file  Objective: Vital Signs: BP 104/67 (BP Location: Left Arm, Patient Position: Sitting, Cuff Size: Normal)   Pulse 85   Resp 16   Ht 5' 5"  (1.651 m)   Wt 232 lb (105.2 kg)   LMP 05/19/2012   BMI 38.61 kg/m    Physical  Exam  Constitutional: She is oriented to person, place, and time. She appears well-developed and well-nourished.  HENT:  Head: Normocephalic and atraumatic.  Eyes: Conjunctivae and EOM are normal.  Neck: Normal range of motion.  Cardiovascular: Normal rate, regular rhythm, normal heart sounds and intact distal pulses.   Pulmonary/Chest: Effort normal and breath sounds normal.  Abdominal: Soft. Bowel sounds are normal.  Lymphadenopathy:    She has no cervical adenopathy.  Neurological: She is alert and oriented to person, place, and time.  Skin: Skin is warm and dry. Capillary refill takes less than 2 seconds.  Psychiatric: She has a normal mood and affect. Her behavior is normal.  Nursing note and vitals reviewed.    Musculoskeletal Exam: C-spine and thoracic lumbar spine good range of motion. She is some tenderness over SI joint area consistent with sacroiliitis. Shoulder joints elbow joints wrist joint MCPs PIPs with good range of motion. No synovitis was noted. Hip joints knee joints ankles MTPs PIPs with good range of motion. She is some tenderness across her MTP joints. But no obvious synovitis was noted.  CDAI Exam: CDAI Homunculus Exam:   Joint Counts:  CDAI Tender Joint count: 0 CDAI Swollen Joint count: 0  Global Assessments:  Patient Global Assessment: 4 Provider Global Assessment: 4  CDAI Calculated Score: 8    Investigation: Findings:  Negative TB gold 08/03/2015    CBC Latest Ref Rng & Units 05/14/2016 02/19/2016 12/03/2015  WBC 3.8 - 10.8 K/uL 8.8 8.3 9.0  Hemoglobin 11.7 - 15.5 g/dL 12.2 12.7 12.7  Hematocrit 35.0 - 45.0 % 36.9 38.9 38.5  Platelets 140 - 400 K/uL 290 312 322   CMP Latest Ref Rng & Units 05/14/2016 02/19/2016 12/03/2015  Glucose 65 - 99 mg/dL 97 98 98  BUN 7 - 25 mg/dL 17 16 14   Creatinine 0.50 - 1.05 mg/dL 0.89 0.89 0.72  Sodium 135 - 146 mmol/L 139 139 140  Potassium 3.5 - 5.3 mmol/L 4.6 4.5 4.2  Chloride 98 - 110 mmol/L 104 104 105  CO2 20  - 31 mmol/L 25 28 25   Calcium 8.6 - 10.4 mg/dL 9.2 9.4 9.4  Total Protein 6.1 - 8.1 g/dL 6.9 6.9 7.0  Total Bilirubin 0.2 - 1.2 mg/dL 0.3 0.4 0.3  Alkaline Phos 33 - 130 U/L 86 87 89  AST 10 - 35 U/L 19 26 18   ALT 6 - 29 U/L 25 28 19      Imaging: [place] Extrem Low Bilat Ltd  Result Date: 07/30/2016 Per [facility] recommendations ultrasound examination of bilateral feet was obtained. Using 12 MHz transducer, grayscale and power Doppler bilateral first, second, fourth, fifth MTP joints and bilateral tibiotalar joints were evaluated to look for synovitis or tenosynovitis. Bilateral plantar fascia were also evaluated. The findings were there was mild hyperemia on the volar aspect of her right first and second MTP joints. There is a changes were noted. None of the other MTP showed any synovitis. There was some synovial thickening noted in the MTP joints. No tenosynovitis was noted. There was no synovitis in the tibiotalar joint or tenosynovitis in the plantar fascia. Impression: Mild hyperemia noted in the right first and second MTP joints.   Speciality  Comments: No specialty comments available.    Procedures:  No procedures performed Allergies: Tape   Assessment / Plan:     Visit Diagnoses: Pain in both feet - Plan: [place] Extrem Low Bilat Ltd: The ultrasound today showed mild hyperemia in her right first and second MTP joint on the plantar aspect. There was no evidence of plantar fasciitis or ankle joint at the moment. Her symptoms have clinically improved since the last visit. The plan is to continue current treatment.  Other ulcerative colitis without complication [facility]): Doing better on current combination of medications.  High risk medication use - Methotrexate 6 tablets by mouth every week, folic acid 1 mg by mouth daily, Humira 40 mg subcutaneous every other week - Plan: COMPLETE METABOLIC PANEL WITH GFR, CBC with Differential/Platelet, Quantiferon tb gold assay (blood) as she is on long-term  immunosuppressive agents. Need for yearly skin examination was discussed as well. She will schedule dermatology appointment.  Sacroiliitis [facility]): She still have some discomfort in the SI joint.  Primary osteoarthritis of both knees: Pain is tolerable  Primary osteoarthritis of both feet: It contributes to some discomfort in her feet.  S/p bilateral carpal tunnel release  History of asthma  History of peptic ulcer disease  History of attention deficit hyperactivity disorder    Orders: Orders Placed This Encounter  Procedures  . [place] Extrem Low Bilat Ltd  . COMPLETE METABOLIC PANEL WITH GFR  . CBC with Differential/Platelet  . Quantiferon tb gold assay (blood)   No orders of the defined types were placed in this encounter.   Face-to-face time spent with patient was 30 minutes. 50% of time was spent in counseling and coordination of care.  Follow-Up Instructions: Return in about 5 months (around 12/30/2016) for Inflammatory arthritis, ulcerative colitis.   Bo Merino, MD  Note - This record has been created using Editor, commissioning.  Chart creation errors have been sought, but may not always  have been located. Such creation errors do not reflect on  the standard of medical care.

## 2016-07-30 NOTE — Progress Notes (Signed)
Rheumatology Medication Review by a Pharmacist Does the patient feel that his/her medications are working for him/her?  Yes Has the patient been experiencing any side effects to the medications prescribed?  No Does the patient have any problems obtaining medications?  No  Issues to address at subsequent visits: None   Pharmacist comments:  Mercedes [name] is a pleasant 51 yo F who presents for follow up.  She is currently taking Humira 40 mg every other week, methotrexate 6 tablets weekly, and folic acid 1 mg daily.  [name] recent standing labs were normal on 05/14/16 which were normal.  She will be due for standing labs at the beginning of August.  [name] recent TB Gold negative 08/03/15.  She is due for TB Gold this month.  Patient prefers to have TB Gold and standing labs drawn today.  Patient denies any questions or concerns regarding her medications at this time.   Mercedes [name], Pharm.D., BCPS, CPP Clinical Pharmacist Pager: [phone] Phone: [phone] 07/30/2016 2:19 PM

## 2016-07-31 LAB — QUANTIFERON TB GOLD ASSAY (BLOOD)
Interferon Gamma Release Assay: NEGATIVE
Mitogen-Nil: >10 IU/mL
Quantiferon Nil Value: 0.09 IU/mL

## 2016-08-01 ENCOUNTER — Telehealth: Payer: Self-pay

## 2016-08-01 NOTE — Telephone Encounter (Signed)
Patient was returning Andrea's call concerning lab results.  CB# is [phone].  Please advise. Thank You.

## 2016-08-01 NOTE — Progress Notes (Signed)
Mildly elevat ALT. Patient is on methotrexate. We will continue to monitor labs.

## 2016-08-04 NOTE — Telephone Encounter (Signed)
Patient advised of lab results and verbalized understanding. Patient states she has been having pain in her side which she feels like is above her liver. Patient is concerned with LFTs being elevated. Patient has seen Dr. Collene Mares recently and was advised Dr. Collene Mares feel like it is IBS.

## 2016-08-04 NOTE — Telephone Encounter (Signed)
Abdominal pain is not related to elevated LFTs. We will monitor labs. Symptoms prob are due to IBS.

## 2016-08-05 NOTE — Telephone Encounter (Signed)
Attempted to contact the patient and left message for patient to call the office.  

## 2016-08-05 NOTE — Telephone Encounter (Signed)
Patient advised abdominal pain is not related to elevated LFTs. Patient advised we will monitor. Patient advised symptoms are probably related to IBS. Patient verbalized understanding.

## 2016-08-08 ENCOUNTER — Other Ambulatory Visit: Payer: Self-pay | Admitting: Rheumatology

## 2016-08-08 NOTE — Telephone Encounter (Signed)
Last Visit: 07/30/16 Next Visit: 12/30/16  Okay to refill per Dr. Estanislado Pandy

## 2016-08-28 ENCOUNTER — Telehealth: Payer: Self-pay | Admitting: Rheumatology

## 2016-08-28 MED ORDER — METHOTREXATE 2.5 MG PO TABS: 15.0000 mg | ORAL_TABLET | ORAL | 0 refills | Status: DC

## 2016-08-28 NOTE — Telephone Encounter (Signed)
Patient needs a refill on MTX. [facility] uses [facility].

## 2016-08-28 NOTE — Telephone Encounter (Signed)
05/21/16 last visit  12/30/16 next visit    CBC Latest Ref Rng & Units 07/30/2016 05/14/2016 02/19/2016  WBC 3.8 - 10.8 K/uL 8.9 8.8 8.3  Hemoglobin 11.7 - 15.5 g/dL 12.2 12.2 12.7  Hematocrit 35.0 - 45.0 % 37.3 36.9 38.9  Platelets 140 - 400 K/uL 293 290 312   CMP Latest Ref Rng & Units 07/30/2016 05/14/2016 02/19/2016  Glucose 65 - 99 mg/dL 90 97 98  BUN 7 - 25 mg/dL 16 17 16   Creatinine 0.50 - 1.05 mg/dL 0.81 0.89 0.89  Sodium 135 - 146 mmol/L 140 139 139  Potassium 3.5 - 5.3 mmol/L 4.2 4.6 4.5  Chloride 98 - 110 mmol/L 104 104 104  CO2 20 - 31 mmol/L 27 25 28   Calcium 8.6 - 10.4 mg/dL 9.3 9.2 9.4  Total Protein 6.1 - 8.1 g/dL 6.9 6.9 6.9  Total Bilirubin 0.2 - 1.2 mg/dL 0.3 0.3 0.4  Alkaline Phos 33 - 130 U/L 97 86 87  AST 10 - 35 U/L 25 19 26   ALT 6 - 29 U/L 36(H) 25 28   Ok to refill per Dr Estanislado Pandy

## 2016-09-01 ENCOUNTER — Telehealth: Payer: Self-pay | Admitting: Radiology

## 2016-09-01 NOTE — Telephone Encounter (Signed)
This was sent in on 08/28/16

## 2016-09-01 NOTE — Telephone Encounter (Signed)
Refill request received via fax for Methotrexate from [facility] ? [place]

## 2016-09-30 ENCOUNTER — Telehealth: Payer: Self-pay | Admitting: Rheumatology

## 2016-09-30 NOTE — Telephone Encounter (Signed)
Patient needs new rx for MTX , and Humira. Please send to [facility].

## 2016-09-30 NOTE — Telephone Encounter (Signed)
Attempted to contact the patient and left message for patient to call the office.  

## 2016-10-01 MED ORDER — ADALIMUMAB 40 MG/0.8ML ~~LOC~~ AJKT: 40.0000 mg | AUTO-INJECTOR | SUBCUTANEOUS | 0 refills | Status: DC

## 2016-10-01 NOTE — Addendum Note (Signed)
Addended by: Carole Binning on: 10/01/2016 10:39 AM   Modules accepted: Orders

## 2016-10-01 NOTE — Telephone Encounter (Signed)
Patient advised MTX was sent into pharmacy in August. Patient will contact the pharmacy for refill.   Last Visit: 07/30/16 Next Visit: 12/30/16 Labs: 07/30/16 Mildly elevat ALT We will continue to monitor labs TB Gold: 07/30/16 Neg  Okay to refill per Dr. Estanislado Pandy

## 2016-11-06 ENCOUNTER — Other Ambulatory Visit: Payer: Self-pay

## 2016-11-06 DIAGNOSIS — Z79631 Long term (current) use of antimetabolite agent: Secondary | ICD-10-CM

## 2016-11-06 DIAGNOSIS — Z79899 Other long term (current) drug therapy: Secondary | ICD-10-CM

## 2016-11-06 LAB — COMPLETE METABOLIC PANEL WITH GFR
AG RATIO: 1.4 (calc) (ref 1.0–2.5)
ALKALINE PHOSPHATASE (APISO): 95 U/L (ref 33–130)
ALT: 33 U/L — ABNORMAL HIGH (ref 6–29)
AST: 24 U/L (ref 10–35)
Albumin: 4.1 g/dL (ref 3.6–5.1)
BUN: 13 mg/dL (ref 7–25)
CALCIUM: 9.4 mg/dL (ref 8.6–10.4)
CO2: 28 mmol/L (ref 20–32)
CREATININE: 0.85 mg/dL (ref 0.50–1.05)
Chloride: 104 mmol/L (ref 98–110)
GFR, EST NON AFRICAN AMERICAN: 78 mL/min/{1.73_m2} (ref >=60)
GFR, Est African American: 91 mL/min/{1.73_m2} (ref >=60)
GLOBULIN: 2.9 g/dL (ref 1.9–3.7)
Glucose, Bld: 99 mg/dL (ref 65–99)
Potassium: 4.2 mmol/L (ref 3.5–5.3)
SODIUM: 139 mmol/L (ref 135–146)
Total Bilirubin: 0.3 mg/dL (ref 0.2–1.2)
Total Protein: 7 g/dL (ref 6.1–8.1)

## 2016-11-06 LAB — CBC WITH DIFFERENTIAL/PLATELET
BASOS ABS: 32 {cells}/uL (ref 0–200)
Basophils Relative: 0.4 %
EOS PCT: 2.5 %
Eosinophils Absolute: 198 cells/uL (ref 15–500)
HEMATOCRIT: 37.5 % (ref 35.0–45.0)
HEMOGLOBIN: 12.3 g/dL (ref 11.7–15.5)
LYMPHS ABS: 3065 {cells}/uL (ref 850–3900)
MCH: 29.6 pg (ref 27.0–33.0)
MCHC: 32.8 g/dL (ref 32.0–36.0)
MCV: 90.1 fL (ref 80.0–100.0)
MPV: 10.3 fL (ref 7.5–12.5)
Monocytes Relative: 10.3 %
NEUTROS PCT: 48 %
Neutro Abs: 3792 cells/uL (ref 1500–7800)
Platelets: 296 10*3/uL (ref 140–400)
RBC: 4.16 10*6/uL (ref 3.80–5.10)
RDW: 13.9 % (ref 11.0–15.0)
Total Lymphocyte: 38.8 %
WBC: 7.9 10*3/uL (ref 3.8–10.8)
WBCMIX: 814 {cells}/uL (ref 200–950)

## 2016-11-06 MED ORDER — METHOTREXATE 2.5 MG PO TABS: 15.0000 mg | ORAL_TABLET | ORAL | 0 refills | Status: DC

## 2016-11-06 NOTE — Telephone Encounter (Signed)
Last Visit: 07/30/16 Next Visit: 12/30/16 Labs: 07/30/16 Mildly elevat ALT. We will continue to monitor labs  Patient will come by to update labs today or tomorrow.   Okay to refill per Dr. Estanislado Pandy

## 2016-11-06 NOTE — Telephone Encounter (Signed)
Patient would like a Rx refill on MTX sent to [facility].  CB# is [phone].  Please advise.  Thank You.

## 2016-11-07 NOTE — Progress Notes (Signed)
ALT still mildly elevated. She may decrease methotrexate to 5 tablets by mouth every week.

## 2016-12-17 NOTE — Progress Notes (Signed)
Office Visit Note  Patient: Mercedes [name]             Date of Birth: 29-Jun-1965           MRN: [id number]             PCP: Libby Maw, MD Referring: Libby Maw,* Visit Date: 12/30/2016 Occupation: @GUAROCC @    Subjective:  Medication management   History of Present Illness: Mercedes [name] is a 51 y.o. female with history of ulcerative colitis, sacroiliitis, and osteoarthritis.  Patient is taking   MTX 5 tablets weekly, folic 1 mg, and Humira every other week.  She states she is not having discomfort in her SI joints.  She states the injection in the past have helped.  She denies any joint swelling.  She states she occasionally experiences pain in bilateral knees after standing for long periods of time.  She uses heat therapy at night and props up her knees if she is experiencing discomfort.  She experiences feet pain after standing for long periods of time she tries to wear supportive shoes.    Activities of Daily Living:  Patient reports morning stiffness for 1 hour.   Patient Reports nocturnal pain.  Difficulty dressing/grooming: Denies Difficulty climbing stairs: Reports Difficulty getting out of chair: Reports Difficulty using hands for taps, buttons, cutlery, and/or writing: Denies   Review of Systems  Constitutional: Positive for fatigue. Negative for weakness.  HENT: Negative for mouth sores, mouth dryness and nose dryness.   Eyes: Positive for redness and dryness.  Respiratory: Negative for cough, hemoptysis, shortness of breath and difficulty breathing.   Cardiovascular: Negative.  Positive for hypertension. Negative for chest pain, palpitations, irregular heartbeat and swelling in legs/feet.  Gastrointestinal: Positive for diarrhea (Ulcerative colitis ). Negative for blood in stool and constipation.  Endocrine: Negative for increased urination.  Genitourinary: Negative for painful urination.  Musculoskeletal: Positive for arthralgias, joint pain,  muscle weakness and morning stiffness. Negative for joint swelling, myalgias, muscle tenderness and myalgias.  Skin: Negative for color change, pallor, rash, hair loss, nodules/bumps, redness, skin tightness, ulcers and sensitivity to sunlight.  Neurological: Negative for dizziness, numbness and headaches.  Hematological: Negative for swollen glands.  Psychiatric/Behavioral: Negative.  Negative for depressed mood and sleep disturbance. The patient is not nervous/anxious.     PMFS History:  Patient Active Problem List   Diagnosis Date Noted  . History of cholecystectomy 07/30/2016  . Sacroiliitis ([place]) 12/19/2015  . Primary osteoarthritis of both knees 12/19/2015  . Primary osteoarthritis of both feet 12/19/2015  . Depression 12/19/2015  . Peptic ulcer disease 12/19/2015  . Hemorrhoids 12/19/2015  . S/p bilateral carpal tunnel release 12/19/2015  . Dyslipidemia 12/19/2015  . Intermittent asthma without complication 29/92/4268  . Environmental allergies 12/19/2015  . Attention deficit hyperactivity disorder (ADHD) 12/19/2015  . Ulcerative colitis ([place]) 12/18/2015  . High risk medication use 12/18/2015  . Abnormal mammogram 06/09/2012    Past Medical History:  Diagnosis Date  . Asthma   . Hypertension   . Seasonal allergies     Family History  Problem Relation Age of Onset  . Diabetes Mother   . Hypertension Mother   . Hyperlipidemia Mother   . COPD Father   . Arthritis Father   . Thyroid disease Sister   . ADD / ADHD Son   . Down syndrome Daughter    Past Surgical History:  Procedure Laterality Date  . CARPAL TUNNEL RELEASE  unsure   bilaterally  .  CESAREAN SECTION  12/01  . CHOLECYSTECTOMY  12/01  . PARTIAL MASTECTOMY WITH NEEDLE LOCALIZATION Right 07/09/2012   Procedure: RIGHT PARTIAL MASTECTOMY WITH NEEDLE LOCALIZATION;  Surgeon: Adin Hector, MD;  Location: [place];  Service: General;  Laterality: Right;  . SHOULDER OPEN ROTATOR CUFF REPAIR   2009   rt shoulder   Social History   Social History Narrative  . Not on file     Objective: Vital Signs: BP 112/73 (BP Location: Left Arm, Patient Position: Sitting, Cuff Size: Normal)   Pulse 87   Resp 17   Ht 5' 5"  (1.651 m)   Wt 236 lb (107 kg)   LMP 05/19/2012   BMI 39.27 kg/m    Physical Exam  Constitutional: She is oriented to person, place, and time. She appears well-developed and well-nourished.  HENT:  Head: Normocephalic and atraumatic.  Eyes: Conjunctivae and EOM are normal.  Neck: Normal range of motion.  Cardiovascular: Normal rate, regular rhythm, normal heart sounds and intact distal pulses.  Pulmonary/Chest: Effort normal and breath sounds normal.  Abdominal: Soft. Bowel sounds are normal.  Lymphadenopathy:    She has no cervical adenopathy.  Neurological: She is alert and oriented to person, place, and time.  Skin: Skin is warm and dry. Capillary refill takes less than 2 seconds.  Psychiatric: She has a normal mood and affect. Her behavior is normal.  Nursing note and vitals reviewed.    Musculoskeletal Exam: C-spine, thoracic, and lumbar spine good ROM.  Shoulder joints, elbow joints, wrist joints good ROM.  MCPs, PIPs, and DIPs good ROM with no synovitis.  Hip joints, knee joints, ankle joints good ROM with no synovitis.  MTPs, PIPs, and DIPs good ROM with no synovitis.  No tenderness of SI joints.  No midline spinal tenderness.    CDAI Exam: No CDAI exam completed.    Investigation: No additional findings. TB Gold: 07/30/2016 Negative   CBC Latest Ref Rng & Units 11/06/2016 07/30/2016 05/14/2016  WBC 3.8 - 10.8 Thousand/uL 7.9 8.9 8.8  Hemoglobin 11.7 - 15.5 g/dL 12.3 12.2 12.2  Hematocrit 35.0 - 45.0 % 37.5 37.3 36.9  Platelets 140 - 400 Thousand/uL 296 293 290   CMP Latest Ref Rng & Units 11/06/2016 07/30/2016 05/14/2016  Glucose 65 - 99 mg/dL 99 90 97  BUN 7 - 25 mg/dL 13 16 17   Creatinine 0.50 - 1.05 mg/dL 0.85 0.81 0.89  Sodium 135 - 146  mmol/L 139 140 139  Potassium 3.5 - 5.3 mmol/L 4.2 4.2 4.6  Chloride 98 - 110 mmol/L 104 104 104  CO2 20 - 32 mmol/L 28 27 25   Calcium 8.6 - 10.4 mg/dL 9.4 9.3 9.2  Total Protein 6.1 - 8.1 g/dL 7.0 6.9 6.9  Total Bilirubin 0.2 - 1.2 mg/dL 0.3 0.3 0.3  Alkaline Phos 33 - 130 U/L - 97 86  AST 10 - 35 U/L 24 25 19   ALT 6 - 29 U/L 33(H) 36(H) 25    Imaging: No results found.  Speciality Comments: No specialty comments available.    Procedures:  No procedures performed Allergies: Other; Tape; and Tree extract   Assessment / Plan:     Visit Diagnoses: Other ulcerative colitis without complication [facility]): Patient is followed by Dr. Collene Mares.  She will continue on Humira every other week, MTX 5 tablets weekly, and folic acid 1 mg daily.  She was switched to the new formation of Humira today.    High risk medication use - Humira, MTX,  folic acid: routine CBC and CMP were performed on 11/06/16.  She will return for CBC and CMP in January and every 3 months following.  LFTs will continue to be monitored.    Sacroiliitis [facility]): No tenderness upon palpation of SI joints.  Patient did not require a cortisone injection today.    Primary osteoarthritis of both knees: Occasional discomfort after long periods of standing or sitting.  She will continue alternating heat and ice as tolerated and using elevation.    Primary osteoarthritis of both feet: Discussed the importance of proper fitting shoes.    S/p bilateral carpal tunnel release: Patient states she has started to notice mild occasional return of shooting pains.  She is not concerned at this time.   Other medical conditions are listed as follows:   History of cholecystectomy  History of peptic ulcer disease  History of attention deficit disorder  History of asthma    Orders: No orders of the defined types were placed in this encounter.  Meds ordered this encounter  Medications  . Adalimumab (HUMIRA PEN) 40 MG/0.4ML PNKT    Sig:  Inject 40 mg into the skin every 14 (fourteen) days.    Dispense:  3 each    Refill:  0    3 kits-6pens      Follow-Up Instructions: Return in about 5 months (around 05/30/2017) for Ulcerative colitis, sacroilitis .  Note - This record has been created using [employer].  Chart creation errors have been sought, but may not always  have been located. Such creation errors do not reflect on  the standard of medical care.

## 2016-12-23 ENCOUNTER — Other Ambulatory Visit: Payer: Self-pay | Admitting: Rheumatology

## 2016-12-24 NOTE — Telephone Encounter (Addendum)
Last Visit: 07/30/16 Next Visit: 12/30/16 Labs: 11/06/16 ALT still mildly elevated TB Gold: 07/30/16 Neg  Okay to refill per Dr. Estanislado Pandy

## 2016-12-25 DIAGNOSIS — K5730 Diverticulosis of large intestine without perforation or abscess without bleeding: Secondary | ICD-10-CM | POA: Insufficient documentation

## 2016-12-30 ENCOUNTER — Encounter: Payer: Self-pay | Admitting: Rheumatology

## 2016-12-30 ENCOUNTER — Ambulatory Visit (INDEPENDENT_AMBULATORY_CARE_PROVIDER_SITE_OTHER): Payer: 59 | Admitting: Rheumatology

## 2016-12-30 VITALS — BP 112/73 | HR 87 | Resp 17 | Ht 65.0 in | Wt 236.0 lb

## 2016-12-30 DIAGNOSIS — M170 Bilateral primary osteoarthritis of knee: Secondary | ICD-10-CM | POA: Diagnosis not present

## 2016-12-30 DIAGNOSIS — M19071 Primary osteoarthritis, right ankle and foot: Secondary | ICD-10-CM

## 2016-12-30 DIAGNOSIS — Z8709 Personal history of other diseases of the respiratory system: Secondary | ICD-10-CM | POA: Diagnosis not present

## 2016-12-30 DIAGNOSIS — Z9049 Acquired absence of other specified parts of digestive tract: Secondary | ICD-10-CM

## 2016-12-30 DIAGNOSIS — Z8711 Personal history of peptic ulcer disease: Secondary | ICD-10-CM

## 2016-12-30 DIAGNOSIS — K5180 Other ulcerative colitis without complications: Secondary | ICD-10-CM

## 2016-12-30 DIAGNOSIS — M461 Sacroiliitis, not elsewhere classified: Secondary | ICD-10-CM | POA: Diagnosis not present

## 2016-12-30 DIAGNOSIS — Z79899 Other long term (current) drug therapy: Secondary | ICD-10-CM

## 2016-12-30 DIAGNOSIS — Z8659 Personal history of other mental and behavioral disorders: Secondary | ICD-10-CM

## 2016-12-30 DIAGNOSIS — Z9889 Other specified postprocedural states: Secondary | ICD-10-CM | POA: Diagnosis not present

## 2016-12-30 DIAGNOSIS — M19072 Primary osteoarthritis, left ankle and foot: Secondary | ICD-10-CM | POA: Diagnosis not present

## 2016-12-30 MED ORDER — ADALIMUMAB 40 MG/0.4ML ~~LOC~~ AJKT: 40.0000 mg | AUTO-INJECTOR | SUBCUTANEOUS | 0 refills | Status: DC

## 2016-12-30 NOTE — Patient Instructions (Signed)
Standing Labs We placed an order today for your standing lab work.    Please come back and get your standing labs in January and every 3 months following  We have open lab Monday through Friday from 8:30-11:30 AM and 1:30-4 PM at the office of Dr. Bo Merino.   The office is located at [address] [place], [place], [place] [address] No appointment is necessary.   Labs are drawn by [facility].  You may receive a bill from [facility] for your lab work. If you have any questions regarding directions or hours of operation,  please call [phone].

## 2017-01-23 ENCOUNTER — Other Ambulatory Visit: Payer: Self-pay | Admitting: Rheumatology

## 2017-01-23 NOTE — Telephone Encounter (Addendum)
Last Visit: 12/30/16 Next Visit :06/16/17 Labs: 11/06/16 ALT still mildly elevated. She may decrease methotrexate to 5 tablets by mouth every week.  Patient will update labs next week  Okay refill per Dr. Estanislado Pandy

## 2017-01-27 ENCOUNTER — Other Ambulatory Visit: Payer: Self-pay

## 2017-01-27 DIAGNOSIS — Z79899 Other long term (current) drug therapy: Secondary | ICD-10-CM

## 2017-01-27 LAB — COMPLETE METABOLIC PANEL WITH GFR
AG Ratio: 1.5 (calc) (ref 1.0–2.5)
ALT: 31 U/L — AB (ref 6–29)
AST: 24 U/L (ref 10–35)
Albumin: 4.1 g/dL (ref 3.6–5.1)
Alkaline phosphatase (APISO): 95 U/L (ref 33–130)
BUN: 14 mg/dL (ref 7–25)
CALCIUM: 9.3 mg/dL (ref 8.6–10.4)
CO2: 28 mmol/L (ref 20–32)
CREATININE: 0.93 mg/dL (ref 0.50–1.05)
Chloride: 104 mmol/L (ref 98–110)
GFR, EST AFRICAN AMERICAN: 81 mL/min/{1.73_m2} (ref >=60)
GFR, EST NON AFRICAN AMERICAN: 70 mL/min/{1.73_m2} (ref >=60)
GLUCOSE: 99 mg/dL (ref 65–99)
Globulin: 2.7 g/dL (calc) (ref 1.9–3.7)
Potassium: 4 mmol/L (ref 3.5–5.3)
Sodium: 140 mmol/L (ref 135–146)
TOTAL PROTEIN: 6.8 g/dL (ref 6.1–8.1)
Total Bilirubin: 0.2 mg/dL (ref 0.2–1.2)

## 2017-01-27 LAB — CBC WITH DIFFERENTIAL/PLATELET
BASOS ABS: 27 {cells}/uL (ref 0–200)
BASOS PCT: 0.3 %
EOS ABS: 178 {cells}/uL (ref 15–500)
Eosinophils Relative: 2 %
HCT: 36.1 % (ref 35.0–45.0)
HEMOGLOBIN: 12 g/dL (ref 11.7–15.5)
Lymphs Abs: 3151 cells/uL (ref 850–3900)
MCH: 29.6 pg (ref 27.0–33.0)
MCHC: 33.2 g/dL (ref 32.0–36.0)
MCV: 88.9 fL (ref 80.0–100.0)
MPV: 10.3 fL (ref 7.5–12.5)
Monocytes Relative: 7.7 %
NEUTROS ABS: 4859 {cells}/uL (ref 1500–7800)
Neutrophils Relative %: 54.6 %
Platelets: 294 10*3/uL (ref 140–400)
RBC: 4.06 10*6/uL (ref 3.80–5.10)
RDW: 13.2 % (ref 11.0–15.0)
Total Lymphocyte: 35.4 %
WBC mixed population: 685 cells/uL (ref 200–950)
WBC: 8.9 10*3/uL (ref 3.8–10.8)

## 2017-01-28 NOTE — Progress Notes (Signed)
ALT improved

## 2017-03-12 ENCOUNTER — Other Ambulatory Visit: Payer: Self-pay | Admitting: Rheumatology

## 2017-03-13 NOTE — Telephone Encounter (Signed)
Last Visit: 12/30/16 Next Visit :06/16/17 Labs: 01/27/17 ALT improved TB Gold: 07/30/16 Neg   Okay to refill per Dr. Estanislado Pandy

## 2017-03-24 ENCOUNTER — Other Ambulatory Visit: Payer: Self-pay | Admitting: Rheumatology

## 2017-03-24 NOTE — Telephone Encounter (Signed)
Last Visit: 12/30/16 Next Visit :06/16/17  Okay to refill per Dr. Estanislado Pandy

## 2017-04-02 ENCOUNTER — Other Ambulatory Visit: Payer: Self-pay | Admitting: Family Medicine

## 2017-05-04 ENCOUNTER — Other Ambulatory Visit: Payer: Self-pay | Admitting: *Deleted

## 2017-05-04 DIAGNOSIS — Z79899 Other long term (current) drug therapy: Secondary | ICD-10-CM

## 2017-05-04 LAB — CBC WITH DIFFERENTIAL/PLATELET
BASOS PCT: 0.4 %
Basophils Absolute: 38 cells/uL (ref 0–200)
Eosinophils Absolute: 182 cells/uL (ref 15–500)
Eosinophils Relative: 1.9 %
HCT: 37.3 % (ref 35.0–45.0)
HEMOGLOBIN: 12.7 g/dL (ref 11.7–15.5)
Lymphs Abs: 3437 cells/uL (ref 850–3900)
MCH: 30.2 pg (ref 27.0–33.0)
MCHC: 34 g/dL (ref 32.0–36.0)
MCV: 88.6 fL (ref 80.0–100.0)
MONOS PCT: 7.3 %
MPV: 10.2 fL (ref 7.5–12.5)
NEUTROS ABS: 5242 {cells}/uL (ref 1500–7800)
Neutrophils Relative %: 54.6 %
Platelets: 322 10*3/uL (ref 140–400)
RBC: 4.21 10*6/uL (ref 3.80–5.10)
RDW: 13.2 % (ref 11.0–15.0)
Total Lymphocyte: 35.8 %
WBC: 9.6 10*3/uL (ref 3.8–10.8)
WBCMIX: 701 {cells}/uL (ref 200–950)

## 2017-05-04 LAB — COMPLETE METABOLIC PANEL WITH GFR
AG Ratio: 1.4 (calc) (ref 1.0–2.5)
ALT: 26 U/L (ref 6–29)
AST: 23 U/L (ref 10–35)
Albumin: 4.4 g/dL (ref 3.6–5.1)
Alkaline phosphatase (APISO): 103 U/L (ref 33–130)
BILIRUBIN TOTAL: 0.3 mg/dL (ref 0.2–1.2)
BUN: 16 mg/dL (ref 7–25)
CALCIUM: 9.4 mg/dL (ref 8.6–10.4)
CHLORIDE: 101 mmol/L (ref 98–110)
CO2: 27 mmol/L (ref 20–32)
Creat: 0.79 mg/dL (ref 0.50–1.05)
GFR, EST AFRICAN AMERICAN: 99 mL/min/{1.73_m2} (ref >=60)
GFR, EST NON AFRICAN AMERICAN: 85 mL/min/{1.73_m2} (ref >=60)
GLUCOSE: 116 mg/dL — AB (ref 65–99)
Globulin: 3.1 g/dL (calc) (ref 1.9–3.7)
Potassium: 4.1 mmol/L (ref 3.5–5.3)
Sodium: 138 mmol/L (ref 135–146)
TOTAL PROTEIN: 7.5 g/dL (ref 6.1–8.1)

## 2017-05-21 ENCOUNTER — Ambulatory Visit (INDEPENDENT_AMBULATORY_CARE_PROVIDER_SITE_OTHER): Payer: Managed Care, Other (non HMO)

## 2017-05-21 ENCOUNTER — Encounter: Payer: Self-pay | Admitting: Podiatry

## 2017-05-21 ENCOUNTER — Ambulatory Visit (INDEPENDENT_AMBULATORY_CARE_PROVIDER_SITE_OTHER): Payer: Managed Care, Other (non HMO) | Admitting: Podiatry

## 2017-05-21 VITALS — BP 127/79 | HR 77

## 2017-05-21 DIAGNOSIS — M7661 Achilles tendinitis, right leg: Secondary | ICD-10-CM

## 2017-05-21 DIAGNOSIS — M7751 Other enthesopathy of right foot: Secondary | ICD-10-CM

## 2017-05-21 DIAGNOSIS — M778 Other enthesopathies, not elsewhere classified: Secondary | ICD-10-CM

## 2017-05-21 DIAGNOSIS — M779 Enthesopathy, unspecified: Secondary | ICD-10-CM

## 2017-05-21 NOTE — Progress Notes (Signed)
Subjective:  Patient ID: Mercedes [name], female    DOB: 1965-08-31,  MRN: [id number] HPI Chief Complaint  Patient presents with  . Foot Pain    right back of heel and plantar forefoot pain x 1 month  . New Patient (Initial Visit)    52 y.o. female presents with the above complaint.   ROS: Denies fever chills nausea vomiting muscle aches pain calf pain shortness of breath.    Past Medical History:  Diagnosis Date  . Asthma   . Hypertension   . Seasonal allergies    Past Surgical History:  Procedure Laterality Date  . CARPAL TUNNEL RELEASE  unsure   bilaterally  . CESAREAN SECTION  12/01  . CHOLECYSTECTOMY  12/01  . PARTIAL MASTECTOMY WITH NEEDLE LOCALIZATION Right 07/09/2012   Procedure: RIGHT PARTIAL MASTECTOMY WITH NEEDLE LOCALIZATION;  Surgeon: Adin Hector, MD;  Location: [place];  Service: General;  Laterality: Right;  . SHOULDER OPEN ROTATOR CUFF REPAIR  2009   rt shoulder    Current Outpatient Medications:  .  Adalimumab (HUMIRA PEN) 40 MG/0.4ML PNKT, Inject 40 mg into the skin every 14 (fourteen) days., Disp: 3 each, Rfl: 0 .  albuterol (PROVENTIL HFA;VENTOLIN HFA) 108 (90 BASE) MCG/ACT inhaler, Inhale 2 puffs into the lungs every 6 (six) hours as needed for wheezing., Disp: , Rfl:  .  atorvastatin (LIPITOR) 20 MG tablet, TAKE 1 TABLET BY MOUTH DAILY WITH BREAKFAST, Disp: 90 tablet, Rfl: 0 .  buPROPion (WELLBUTRIN XL) 300 MG 24 hr tablet, Take 300 mg by mouth every morning. , Disp: , Rfl:  .  cetirizine (ZYRTEC) 10 MG tablet, Take 10 mg by mouth daily., Disp: , Rfl:  .  folic acid (FOLVITE) 1 MG tablet, TAKE 2 TABLETS BY MOUTH EVERY DAY, Disp: 180 tablet, Rfl: 0 .  lisinopril-hydrochlorothiazide (PRINZIDE,ZESTORETIC) 20-12.5 MG per tablet, 1 tablet daily. , Disp: , Rfl:  .  metFORMIN (GLUCOPHAGE) 500 MG tablet, Take 500 mg by mouth daily., Disp: , Rfl:  .  methotrexate (RHEUMATREX) 2.5 MG tablet, TAKE 6 TABLETS BY MOUTH ONE TIME WEEKLY (TOTAL OF  15MG WEEKLY) CAUTION: CHEMOTHERAPPY. PROTECT FROM LIGHT, Disp: 24 tablet, Rfl: 11 .  sertraline (ZOLOFT) 50 MG tablet, Take 50 mg by mouth daily. , Disp: , Rfl:  .  HUMIRA PEN 40 MG/0.8ML PNKT, INJECT 40MG SUBCUTANEOUSLY EVERY OTHER WEEK (Patient not taking: Reported on 05/21/2017), Disp: 6 each, Rfl: 0  Allergies  Allergen Reactions  . Other Other (See Comments)  . Tape     Other reaction(s): BANDAIDS  . Tree Extract Other (See Comments)   Review of Systems Objective:   Vitals:   05/21/17 0914  BP: 127/79  Pulse: 77    General: Well developed, nourished, in no acute distress, alert and oriented x3   Dermatological: Skin is warm, dry and supple bilateral. Nails x 10 are well maintained; remaining integument appears unremarkable at this time. There are no open sores, no preulcerative lesions, no rash or signs of infection present.  Vascular: Dorsalis Pedis artery and Posterior Tibial artery pedal pulses are 2/4 bilateral with immedate capillary fill time. Pedal hair growth present. No varicosities and no lower extremity edema present bilateral.   Neruologic: Grossly intact via light touch bilateral. Vibratory intact via tuning fork bilateral. Protective threshold with Semmes Wienstein monofilament intact to all pedal sites bilateral. Patellar and Achilles deep tendon reflexes 2+ bilateral. No Babinski or clonus noted bilateral.   Musculoskeletal: No gross boney pedal deformities bilateral.  No pain, crepitus, or limitation noted with foot and ankle range of motion bilateral. Muscular strength 5/5 in all groups tested bilateral.  Gait: Unassisted, Nonantalgic.    Radiographs:  Radiographs taken today demonstrate no acute findings of the forefoot does demonstrate some mild edema around the second metatarsal phalangeal joint right.  She also demonstrates retrocalcaneal heel spur with soft tissue increase in density of calcaneal Achilles insertion site.  No other acute findings noted.     Assessment & Plan:   Assessment: Achilles tendinitis insertional in nature right with bursitis.  Capsulitis second metatarsophalangeal joint right foot.  Plan: Discussed etiology pathology conservative surgical therapy such sterile Betadine skin prep I injected 2 mg of dexamethasone to the posterior aspect of the calcaneus not injecting into the tendon.  Also injected 20 mg Kenalog periarticular around the second metatarsal phalangeal joint with 5 mg of Marcaine.  Tolerated procedure well without comp occasions.  Did not start her on any oral medication because of her rheumatoid.  Did not start her on any steroid because of her diabetes.     Mercedes [name], [place]

## 2017-05-28 ENCOUNTER — Telehealth: Payer: Self-pay | Admitting: Rheumatology

## 2017-05-28 ENCOUNTER — Other Ambulatory Visit: Payer: Self-pay | Admitting: Rheumatology

## 2017-05-28 NOTE — Telephone Encounter (Signed)
Patient called requesting prescription refill of Methotrexate to be sent to [facility].

## 2017-05-28 NOTE — Telephone Encounter (Signed)
Last Visit: 12/30/16 Next Visit :06/16/17 Labs: 05/04/17 Glucose mildly elevated. All other lab values are WNL. TB Gold: 07/30/16 Neg   Okay to refill per Dr. Estanislado Pandy

## 2017-05-29 MED ORDER — METHOTREXATE 2.5 MG PO TABS: ORAL_TABLET | ORAL | 0 refills | Status: DC

## 2017-05-29 NOTE — Telephone Encounter (Signed)
Last Visit: 12/30/16 Next Visit :06/16/17 Labs: 05/04/17 Glucose mildly elevated. All other lab values are WNL.  Okay to refill per Dr. Estanislado Pandy

## 2017-06-04 NOTE — Progress Notes (Signed)
Office Visit Note  Patient: Mercedes [name]             Date of Birth: 17-Jun-1965           MRN: [id number]             PCP: Libby Maw, MD Referring: Libby Maw,* Visit Date: 06/16/2017 Occupation: @GUAROCC @    Subjective:  Increased pain.   History of Present Illness: Paz E Tirrell is a 52 y.o. female history of ulcerative colitis, sacroiliitis and osteoarthritis.  Patient states that she missed her methotrexate dose for about 3 weeks as she ran out of the medications.  She has resume methotrexate about 2 weeks ago.  She noticed she started having increased joint pain while she was off methotrexate.  The discomfort was in her TMJ area and SI joints.  That Humira has been working well to control her ulcerative colitis.  Activities of Daily Living:  Patient reports morning stiffness for 2 hours.   Patient Reports nocturnal pain.  Difficulty dressing/grooming: Denies Difficulty climbing stairs: Reports Difficulty getting out of chair: Reports Difficulty using hands for taps, buttons, cutlery, and/or writing: Denies   Review of Systems  Constitutional: Positive for fatigue. Negative for night sweats, weight gain and weight loss.  HENT: Negative for mouth sores, trouble swallowing, trouble swallowing, mouth dryness and nose dryness.   Eyes: Negative for pain, redness, visual disturbance and dryness.  Respiratory: Negative for cough, shortness of breath and difficulty breathing.   Cardiovascular: Negative for chest pain, palpitations, hypertension, irregular heartbeat and swelling in legs/feet.  Gastrointestinal: Negative for abdominal pain, blood in stool, constipation and diarrhea.  Endocrine: Negative for increased urination.  Genitourinary: Negative for pelvic pain and vaginal dryness.  Musculoskeletal: Positive for arthralgias, joint pain, joint swelling and morning stiffness. Negative for myalgias, muscle weakness, muscle tenderness and myalgias.  Skin:  Negative for color change, rash, hair loss, skin tightness, ulcers and sensitivity to sunlight.  Allergic/Immunologic: Negative for susceptible to infections.  Neurological: Negative for dizziness, light-headedness, headaches, memory loss, night sweats and weakness.  Hematological: Negative for bruising/bleeding tendency and swollen glands.  Psychiatric/Behavioral: Negative for depressed mood, confusion and sleep disturbance. The patient is not nervous/anxious.     PMFS History:  Patient Active Problem List   Diagnosis Date Noted  . 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 History of cholecystectomy 07/30/2016  . Diabetes mellitus ([place]) 01/29/2016  . Impetigo 01/09/2016  . Anniversary reaction 12/20/2015  . Sacroiliitis ([place]) 12/19/2015  . Primary osteoarthritis of both knees 12/19/2015  . Primary osteoarthritis of both feet 12/19/2015  . Depression 12/19/2015  . Peptic ulcer disease 12/19/2015  . Hemorrhoids 12/19/2015  . S/p bilateral carpal tunnel release 12/19/2015  . Dyslipidemia 12/19/2015  . Intermittent asthma without complication 56/38/9373  . Environmental allergies 12/19/2015  . Attention deficit hyperactivity disorder (ADHD) 12/19/2015  . Ulcerative colitis ([place]) 12/18/2015  . High risk medication use 12/18/2015  . Need for hepatitis C screening test 07/12/2015  . Need for prophylactic vaccination against Streptococcus pneumoniae (pneumococcus) 02/07/2015  . Type 2 diabetes mellitus without complication, without long-term current use of insulin ([place]) 02/07/2015  . Anemia 06/13/2013  . Injury of hand, right 06/13/2013  . Prediabetes 06/13/2013  . Anxiety state 12/14/2012  . Hyperlipidemia 12/14/2012  . Hypertension, benign 12/14/2012  . Breast mass 07/09/2012  . Herpes zoster 06/19/2012  . Abnormal mammogram 06/09/2012    Past Medical History:  Diagnosis Date  . Asthma   . Hypertension   .  Seasonal allergies     Family History  Problem Relation Age of  Onset  . Diabetes Mother   . Hypertension Mother   . Hyperlipidemia Mother   . COPD Father   . Arthritis Father   . Thyroid disease Sister   . Asthma Sister   . ADD / ADHD Son   . Down syndrome Daughter    Past Surgical History:  Procedure Laterality Date  . CARPAL TUNNEL RELEASE  unsure   bilaterally  . CESAREAN SECTION  12/01  . CHOLECYSTECTOMY  12/01  . PARTIAL MASTECTOMY WITH NEEDLE LOCALIZATION Right 07/09/2012   Procedure: RIGHT PARTIAL MASTECTOMY WITH NEEDLE LOCALIZATION;  Surgeon: Adin Hector, MD;  Location: [facility];  Service: General;  Laterality: Right;  . SHOULDER OPEN ROTATOR CUFF REPAIR  2009   rt shoulder   Social History   Social History Narrative  . Not on file     Objective: Vital Signs: BP 108/71 (BP Location: Left Arm, Patient Position: Sitting, Cuff Size: Normal)   Pulse 81   Resp 16   Ht 5' 5"  (1.651 m)   Wt 229 lb (103.9 kg)   LMP 05/19/2012   BMI 38.11 kg/m    Physical Exam  Constitutional: She is oriented to person, place, and time. She appears well-developed and well-nourished.  HENT:  Head: Normocephalic and atraumatic.  Eyes: Conjunctivae and EOM are normal.  Neck: Normal range of motion.  Cardiovascular: Normal rate, regular rhythm, normal heart sounds and intact distal pulses.  Pulmonary/Chest: Effort normal and breath sounds normal.  Abdominal: Soft. Bowel sounds are normal.  Lymphadenopathy:    She has no cervical adenopathy.  Neurological: She is alert and oriented to person, place, and time.  Skin: Skin is warm and dry. Capillary refill takes less than 2 seconds.  Psychiatric: She has a normal mood and affect. Her behavior is normal.  Nursing note and vitals reviewed.    Musculoskeletal Exam: C-spine thoracic lumbar spine good range of motion.  She is tenderness over right SI joint and right trochanteric bursa.  Shoulder joints, elbow joints, wrist joints, MCPs PIPs and DIPs were in good range of motion  with no synovitis.  Hip joints knee joints ankles MTPs PIPs DIPs were in good range of motion with no synovitis.  CDAI Exam: No CDAI exam completed.    Investigation: No additional findings.TB Gold: 07/30/2016 Negative  CBC Latest Ref Rng & Units 05/04/2017 01/27/2017 11/06/2016  WBC 3.8 - 10.8 Thousand/uL 9.6 8.9 7.9  Hemoglobin 11.7 - 15.5 g/dL 12.7 12.0 12.3  Hematocrit 35.0 - 45.0 % 37.3 36.1 37.5  Platelets 140 - 400 Thousand/uL 322 294 296   CMP Latest Ref Rng & Units 05/04/2017 01/27/2017 11/06/2016  Glucose 65 - 99 mg/dL 116(H) 99 99  BUN 7 - 25 mg/dL 16 14 13   Creatinine 0.50 - 1.05 mg/dL 0.79 0.93 0.85  Sodium 135 - 146 mmol/L 138 140 139  Potassium 3.5 - 5.3 mmol/L 4.1 4.0 4.2  Chloride 98 - 110 mmol/L 101 104 104  CO2 20 - 32 mmol/L 27 28 28   Calcium 8.6 - 10.4 mg/dL 9.4 9.3 9.4  Total Protein 6.1 - 8.1 g/dL 7.5 6.8 7.0  Total Bilirubin 0.2 - 1.2 mg/dL 0.3 0.2 0.3  Alkaline Phos 33 - 130 U/L - - -  AST 10 - 35 U/L 23 24 24   ALT 6 - 29 U/L 26 31(H) 33(H)    Imaging: Dg Foot Complete Right  Result Date: 05/21/2017  Please see detailed radiograph report in office note.   Speciality Comments: No specialty comments available.    Procedures:  No procedures performed Allergies: Other; Tape; and Tree extract   Assessment / Plan:     Visit Diagnoses: Other ulcerative colitis without complication ([place]) - Patient is followed by Dr. Collene Mares.  According to patient on Humira her GI symptoms are very well controlled.  High risk medication use - Humira 40 mg sq q wk, MTX 5 tabs po q wk, folic acid1 mg po qd.  Her labs are stable and she will continue to get labs every 3 months.  TB gold will be done with her next labs.  Trochanteric bursitis of right hip-IT band exercises were demonstrated and discussed.  Handout was given.  If she has ongoing discomfort she will notify me.  I discussed possible option of cortisone injection in future if she has persistent symptoms.  Sacroiliitis  ([facility])-she is having mild flare of sacroiliitis that she ran out of methotrexate.  Primary osteoarthritis of both knees-weight loss diet and exercise was discussed.  Primary osteoarthritis of both feet  S/p bilateral carpal tunnel release  Medical problems are listed as follows:  History of cholecystectomy  History of asthma  History of peptic ulcer disease  History of attention deficit disorder    Orders: No orders of the defined types were placed in this encounter.  No orders of the defined types were placed in this encounter.   Face-to-face time spent with patient was 30 minutes. >50% of time was spent in counseling and coordination of care.  Follow-Up Instructions: No follow-ups on file.   Bo Merino, MD  Note - This record has been created using Editor, commissioning.  Chart creation errors have been sought, but may not always  have been located. Such creation errors do not reflect on  the standard of medical care.

## 2017-06-16 ENCOUNTER — Ambulatory Visit (INDEPENDENT_AMBULATORY_CARE_PROVIDER_SITE_OTHER): Payer: Managed Care, Other (non HMO) | Admitting: Rheumatology

## 2017-06-16 ENCOUNTER — Encounter: Payer: Self-pay | Admitting: Rheumatology

## 2017-06-16 VITALS — BP 108/71 | HR 81 | Resp 16 | Ht 65.0 in | Wt 229.0 lb

## 2017-06-16 DIAGNOSIS — M7061 Trochanteric bursitis, right hip: Secondary | ICD-10-CM

## 2017-06-16 DIAGNOSIS — Z8709 Personal history of other diseases of the respiratory system: Secondary | ICD-10-CM | POA: Diagnosis not present

## 2017-06-16 DIAGNOSIS — Z79899 Other long term (current) drug therapy: Secondary | ICD-10-CM

## 2017-06-16 DIAGNOSIS — K5180 Other ulcerative colitis without complications: Secondary | ICD-10-CM

## 2017-06-16 DIAGNOSIS — Z8711 Personal history of peptic ulcer disease: Secondary | ICD-10-CM

## 2017-06-16 DIAGNOSIS — M461 Sacroiliitis, not elsewhere classified: Secondary | ICD-10-CM

## 2017-06-16 DIAGNOSIS — M170 Bilateral primary osteoarthritis of knee: Secondary | ICD-10-CM | POA: Diagnosis not present

## 2017-06-16 DIAGNOSIS — M19072 Primary osteoarthritis, left ankle and foot: Secondary | ICD-10-CM

## 2017-06-16 DIAGNOSIS — Z9049 Acquired absence of other specified parts of digestive tract: Secondary | ICD-10-CM

## 2017-06-16 DIAGNOSIS — Z9889 Other specified postprocedural states: Secondary | ICD-10-CM

## 2017-06-16 DIAGNOSIS — M19071 Primary osteoarthritis, right ankle and foot: Secondary | ICD-10-CM | POA: Diagnosis not present

## 2017-06-16 DIAGNOSIS — Z8659 Personal history of other mental and behavioral disorders: Secondary | ICD-10-CM

## 2017-06-16 NOTE — Patient Instructions (Addendum)
Standing Labs We placed an order today for your standing lab work.    Please come back and get your standing labs in July and every 3 months TB gold with next lab  We have open lab Monday through Friday from 8:30-11:30 AM and 1:30-4:00 PM  at the office of Dr. Bo Merino.   You may experience shorter wait times on Monday and Friday afternoons. The office is located at [address] [place], [place], [place] [address] No appointment is necessary.   Labs are drawn by [facility].  You may receive a bill from [facility] for your lab work. If you have any questions regarding directions or hours of operation,  please call [phone].       Iliotibial Band Syndrome Rehab Ask your health care provider which exercises are safe for you. Do exercises exactly as told by your health care provider and adjust them as directed. It is normal to feel mild stretching, pulling, tightness, or discomfort as you do these exercises, but you should stop right away if you feel sudden pain or your pain gets worse.Do not begin these exercises until told by your health care provider. Stretching and range of motion exercises These exercises warm up your muscles and joints and improve the movement and flexibility of your hip and pelvis. Exercise A: Quadriceps, prone  1. Lie on your abdomen on a firm surface, such as a bed or padded floor. 2. Bend your left / right knee and hold your ankle. If you cannot reach your ankle or pant leg, loop a belt around your foot and grab the belt instead. 3. Gently pull your heel toward your buttocks. Your knee should not slide out to the side. You should feel a stretch in the front of your thigh and knee. 4. Hold this position for __________ seconds. Repeat __________ times. Complete this stretch __________ times a day. Exercise B: Iliotibial band  1. Lie on your side with your left / right leg in the top position. 2. Bend both of your knees and grab your left / right ankle.  Stretch out your bottom arm to help you balance. 3. Slowly bring your top knee back so your thigh goes behind your trunk. 4. Slowly lower your top leg toward the floor until you feel a gentle stretch on the outside of your left / right hip and thigh. If you do not feel a stretch and your knee will not fall farther, place the heel of your other foot on top of your knee and pull your knee down toward the floor with your foot. 5. Hold this position for __________ seconds. Repeat __________ times. Complete this stretch __________ times a day. Strengthening exercises These exercises build strength and endurance in your hip and pelvis. Endurance is the ability to use your muscles for a long time, even after they get tired. Exercise C: Straight leg raises ( hip abductors) 1. Lie on your side with your left / right leg in the top position. Lie so your head, shoulder, knee, and hip line up. You may bend your bottom knee to help you balance. 2. Roll your hips slightly forward so your hips are stacked directly over each other and your left / right knee is facing forward. 3. Tense the muscles in your outer thigh and lift your top leg 4-6 inches (10-15 cm). 4. Hold this position for __________ seconds. 5. Slowly return to the starting position. Let your muscles relax completely before doing another repetition. Repeat __________ times.  Complete this exercise __________ times a day. Exercise D: Straight leg raises ( hip extensors) 1. Lie on your abdomen on your bed or a firm surface. You can put a pillow under your hips if that is more comfortable. 2. Bend your left / right knee so your foot is straight up in the air. 3. Squeeze your buttock muscles and lift your left / right thigh off the bed. Do not let your back arch. 4. Tense this muscle as hard as you can without increasing any knee pain. 5. Hold this position for __________ seconds. 6. Slowly lower your leg to the starting position and allow it to relax  completely. Repeat __________ times. Complete this exercise __________ times a day. Exercise E: Hip hike 1. Stand sideways on a bottom step. Stand on your left / right leg with your other foot unsupported next to the step. You can hold onto the railing or wall if needed for balance. 2. Keep your knees straight and your torso square. Then, lift your left / right hip up toward the ceiling. 3. Slowly let your left / right hip lower toward the floor, past the starting position. Your foot should get closer to the floor. Do not lean or bend your knees. Repeat __________ times. Complete this exercise __________ times a day. This information is not intended to replace advice given to you by your health care provider. Make sure you discuss any questions you have with your health care provider. Document Released: 12/30/2004 Document Revised: 09/04/2015 Document Reviewed: 12/01/2014 Elsevier Interactive Patient Education  [employer].

## 2017-06-18 ENCOUNTER — Encounter: Payer: Self-pay | Admitting: Podiatry

## 2017-06-18 ENCOUNTER — Ambulatory Visit (INDEPENDENT_AMBULATORY_CARE_PROVIDER_SITE_OTHER): Payer: Managed Care, Other (non HMO) | Admitting: Podiatry

## 2017-06-18 DIAGNOSIS — M7661 Achilles tendinitis, right leg: Secondary | ICD-10-CM | POA: Diagnosis not present

## 2017-06-18 DIAGNOSIS — M779 Enthesopathy, unspecified: Secondary | ICD-10-CM

## 2017-06-18 DIAGNOSIS — M778 Other enthesopathies, not elsewhere classified: Secondary | ICD-10-CM

## 2017-06-18 NOTE — Progress Notes (Signed)
She presents today for follow-up of her Achilles tendinitis and capsulitis of the second metatarsal phalangeal joint.  She states the numbness is better beneath the second metatarsal phalangeal joint by approximately 80 to 85% but not even 50% well on the Achilles.  Objective: Vital signs are stable alert and oriented x3.  Pulses are palpable.  Hologic sensorium is intact.  Deep tendon reflexes are intact.  Mild tenderness on palpation of the Achilles right.  Assessment: Resolving capsulitis of the second metatarsophalangeal joint mild Achilles tendinitis but resolving some right.  Plan: At this point I reinjected the Achilles tendon subcutaneously 2 mg of dexamethasone and local anesthetic.  Tolerated procedure well make sure not to inject into the tendon itself.

## 2017-07-28 ENCOUNTER — Ambulatory Visit: Payer: Managed Care, Other (non HMO) | Admitting: Podiatry

## 2017-07-29 ENCOUNTER — Other Ambulatory Visit: Payer: Self-pay

## 2017-07-29 DIAGNOSIS — A6000 Herpesviral infection of urogenital system, unspecified: Secondary | ICD-10-CM | POA: Insufficient documentation

## 2017-07-29 DIAGNOSIS — Z79899 Other long term (current) drug therapy: Secondary | ICD-10-CM

## 2017-07-30 NOTE — Progress Notes (Signed)
Mild elevation of LFTs. She is on MTX and statins. May decrease MTX to 5 tabs po q wk.

## 2017-07-31 LAB — QUANTIFERON-TB GOLD PLUS
Mitogen-NIL: >10 IU/mL
NIL: 0.02 IU/mL
QuantiFERON-TB Gold Plus: NEGATIVE
TB1-NIL: 0 [IU]/mL
TB2-NIL: 0 [IU]/mL

## 2017-07-31 LAB — COMPLETE METABOLIC PANEL WITH GFR
AG Ratio: 1.5 (calc) (ref 1.0–2.5)
ALBUMIN MSPROF: 4.4 g/dL (ref 3.6–5.1)
ALKALINE PHOSPHATASE (APISO): 95 U/L (ref 33–130)
ALT: 37 U/L — ABNORMAL HIGH (ref 6–29)
AST: 33 U/L (ref 10–35)
BILIRUBIN TOTAL: 0.4 mg/dL (ref 0.2–1.2)
BUN: 15 mg/dL (ref 7–25)
CHLORIDE: 100 mmol/L (ref 98–110)
CO2: 27 mmol/L (ref 20–32)
Calcium: 9.8 mg/dL (ref 8.6–10.4)
Creat: 0.79 mg/dL (ref 0.50–1.05)
GFR, Est African American: 99 mL/min/{1.73_m2} (ref >=60)
GFR, Est Non African American: 85 mL/min/{1.73_m2} (ref >=60)
GLOBULIN: 3 g/dL (ref 1.9–3.7)
Glucose, Bld: 106 mg/dL — ABNORMAL HIGH (ref 65–99)
Potassium: 4.1 mmol/L (ref 3.5–5.3)
SODIUM: 138 mmol/L (ref 135–146)
Total Protein: 7.4 g/dL (ref 6.1–8.1)

## 2017-07-31 LAB — CBC WITH DIFFERENTIAL/PLATELET
BASOS ABS: 28 {cells}/uL (ref 0–200)
BASOS PCT: 0.3 %
EOS PCT: 1.7 %
Eosinophils Absolute: 160 cells/uL (ref 15–500)
HCT: 36.8 % (ref 35.0–45.0)
HEMOGLOBIN: 12.3 g/dL (ref 11.7–15.5)
LYMPHS ABS: 3234 {cells}/uL (ref 850–3900)
MCH: 29.2 pg (ref 27.0–33.0)
MCHC: 33.4 g/dL (ref 32.0–36.0)
MCV: 87.4 fL (ref 80.0–100.0)
MONOS PCT: 7.4 %
MPV: 10.1 fL (ref 7.5–12.5)
NEUTROS ABS: 5283 {cells}/uL (ref 1500–7800)
Neutrophils Relative %: 56.2 %
Platelets: 325 10*3/uL (ref 140–400)
RBC: 4.21 10*6/uL (ref 3.80–5.10)
RDW: 14 % (ref 11.0–15.0)
Total Lymphocyte: 34.4 %
WBC mixed population: 696 cells/uL (ref 200–950)
WBC: 9.4 10*3/uL (ref 3.8–10.8)

## 2017-08-12 ENCOUNTER — Other Ambulatory Visit: Payer: Self-pay | Admitting: Rheumatology

## 2017-08-12 NOTE — Telephone Encounter (Signed)
Last visit: 06/16/2017 Next visit: 11/19/2017 Labs: 07/29/2017 Mild elevation of LFTs. Reduce MTX to 4 tabs per week.   Okay to refill per Dr. Estanislado Pandy.

## 2017-08-21 ENCOUNTER — Other Ambulatory Visit: Payer: Self-pay | Admitting: Rheumatology

## 2017-08-21 NOTE — Telephone Encounter (Signed)
Last visit: 06/16/2017 Next visit: 11/19/2017 Labs: 07/29/2017 Mild elevation of LFTs. She is on MTX and statins. May decrease MTX to 5 tabs po q wk. Tb Gold: 07/29/2017 negative   Okay to refill per Dr. Estanislado Pandy.

## 2017-09-09 ENCOUNTER — Telehealth: Payer: Self-pay | Admitting: Pharmacy Technician

## 2017-09-09 ENCOUNTER — Other Ambulatory Visit: Payer: Self-pay | Admitting: Pharmacist

## 2017-09-09 DIAGNOSIS — K5180 Other ulcerative colitis without complications: Secondary | ICD-10-CM

## 2017-09-09 MED ORDER — ADALIMUMAB 40 MG/0.4ML ~~LOC~~ PSKT: 40.0000 mg | PREFILLED_SYRINGE | SUBCUTANEOUS | 2 refills | Status: DC

## 2017-09-09 NOTE — Telephone Encounter (Signed)
[place] plan, to get Humira Prior Authorization dates. Per rep Edwena Felty, Patient has an indefinite override on her account for Humira, no PA is required." Looks like patient's authourization was grandfathered in to [employer]'s formulary changes"  RepEdwena Felty Ref# [id number]  11:30 AM Beatriz Chancellor, CPhT

## 2017-09-09 NOTE — Telephone Encounter (Signed)
Received Prior Authorization request from [facility] for Humira 37m/0.8ml. Per Office notes in 12/2016, patient should have been switched to Humira 495m0.4ml CF. [employer]per Rep Marissa, rx was cancelled as a duplicated rx, which was a mistake. Patient has still been receiving Humira 4059m.8ml (Never started Citrate Free). Will follow up with Amber Rph, to see if patient will still switch to CF Humira, if so [facility] is not needed for either strength. PA was initiated due to a refill too soon rejection. Will call patient's insurance plan to confirm.  CigChristella Scheuermannecialty# [phone]:12 AM RacBeatriz ChancellorPhT

## 2017-09-09 NOTE — Progress Notes (Signed)
Sent new RX to pharmacy for citrate free Humira 18m/0.4ml.

## 2017-09-10 ENCOUNTER — Telehealth: Payer: Self-pay | Admitting: Pharmacy Technician

## 2017-09-10 NOTE — Telephone Encounter (Signed)
Received a Prior Authorization request from [facility] for Humira 51m CF. Authorization has been submitted to patient's insurance via Cover My Meds. Will update once we receive a response.  8:28 AM RBeatriz Chancellor CPhT

## 2017-09-15 NOTE — Telephone Encounter (Signed)
Received a fax from [facility] regarding a prior authorization for Humira 23m CF. Authorization has been APPROVED from 09/14/2017 to 09/15/2018.   Will send document to scan center.  Authorization # [id number] # [phone] 8:14 AM RBeatriz Chancellor CPhT

## 2017-09-16 NOTE — Telephone Encounter (Signed)
Received another PA request for Humira from [facility]. Spoke to [employer] at [facility], he stated that there was a clerical error to disregard fax. There is a new auth on file for Humira 13m/0.4ml CF. They will reach out to patient to schedule shipment.  Phone- [phone]- [id number] 8:54 AM RBeatriz Chancellor CPhT

## 2017-11-04 ENCOUNTER — Other Ambulatory Visit: Payer: Self-pay

## 2017-11-04 DIAGNOSIS — Z79899 Other long term (current) drug therapy: Secondary | ICD-10-CM

## 2017-11-05 LAB — COMPLETE METABOLIC PANEL WITH GFR
AG RATIO: 1.3 (calc) (ref 1.0–2.5)
ALKALINE PHOSPHATASE (APISO): 90 U/L (ref 33–130)
ALT: 31 U/L — AB (ref 6–29)
AST: 27 U/L (ref 10–35)
Albumin: 4.1 g/dL (ref 3.6–5.1)
BILIRUBIN TOTAL: 0.3 mg/dL (ref 0.2–1.2)
BUN: 15 mg/dL (ref 7–25)
CALCIUM: 9.3 mg/dL (ref 8.6–10.4)
CHLORIDE: 101 mmol/L (ref 98–110)
CO2: 27 mmol/L (ref 20–32)
Creat: 0.87 mg/dL (ref 0.50–1.05)
GFR, Est African American: 88 mL/min/{1.73_m2} (ref >=60)
GFR, Est Non African American: 76 mL/min/{1.73_m2} (ref >=60)
Globulin: 3.2 g/dL (calc) (ref 1.9–3.7)
Glucose, Bld: 99 mg/dL (ref 65–99)
POTASSIUM: 3.9 mmol/L (ref 3.5–5.3)
SODIUM: 138 mmol/L (ref 135–146)
Total Protein: 7.3 g/dL (ref 6.1–8.1)

## 2017-11-05 LAB — CBC WITH DIFFERENTIAL/PLATELET
BASOS PCT: 0.3 %
Basophils Absolute: 29 cells/uL (ref 0–200)
EOS ABS: 190 {cells}/uL (ref 15–500)
EOS PCT: 2 %
HCT: 36.4 % (ref 35.0–45.0)
HEMOGLOBIN: 12.3 g/dL (ref 11.7–15.5)
Lymphs Abs: 2898 cells/uL (ref 850–3900)
MCH: 29.5 pg (ref 27.0–33.0)
MCHC: 33.8 g/dL (ref 32.0–36.0)
MCV: 87.3 fL (ref 80.0–100.0)
MONOS PCT: 7.9 %
MPV: 10.1 fL (ref 7.5–12.5)
NEUTROS ABS: 5634 {cells}/uL (ref 1500–7800)
Neutrophils Relative %: 59.3 %
Platelets: 325 10*3/uL (ref 140–400)
RBC: 4.17 10*6/uL (ref 3.80–5.10)
RDW: 13 % (ref 11.0–15.0)
Total Lymphocyte: 30.5 %
WBC mixed population: 751 cells/uL (ref 200–950)
WBC: 9.5 10*3/uL (ref 3.8–10.8)

## 2017-11-06 ENCOUNTER — Telehealth: Payer: Self-pay | Admitting: Pharmacist

## 2017-11-06 NOTE — Progress Notes (Signed)
Office Visit Note  Patient: Mercedes [name]             Date of Birth: 11-08-1965           MRN: [id number]             PCP: Libby Maw, MD Referring: Libby Maw,* Visit Date: 11/19/2017 Occupation: @GUAROCC @  Subjective:  Lower back pain.   History of Present Illness: Mercedes [name] is a 52 y.o. female history of ulcerative colitis, sacroiliitis and osteoarthritis..  Patient states that she continues to have discomfort in her SI joints especially in the right side.  The trochanteric bursitis is improved.  She has intermittent discomfort in her hands but no joint swelling.  She has been taking Humira every other week and methotrexate had to be reduced to 4 tablets/week due to elevation in her LFTs.  Activities of Daily Living:  Patient reports morning stiffness for 1 hour.   Patient Reports nocturnal pain.  Difficulty dressing/grooming: Denies Difficulty climbing stairs: Reports Difficulty getting out of chair: Reports Difficulty using hands for taps, buttons, cutlery, and/or writing: Reports  Review of Systems  Constitutional: Positive for fatigue. Negative for night sweats, weight gain and weight loss.  HENT: Negative for mouth sores, trouble swallowing, trouble swallowing, mouth dryness and nose dryness.   Eyes: Negative for pain, redness, visual disturbance and dryness.  Respiratory: Negative for cough, hemoptysis, shortness of breath and difficulty breathing.   Cardiovascular: Positive for swelling in legs/feet. Negative for chest pain, palpitations, hypertension and irregular heartbeat.  Gastrointestinal: Positive for diarrhea. Negative for blood in stool and constipation.  Endocrine: Negative for increased urination.  Genitourinary: Negative for difficulty urinating, painful urination and vaginal dryness.  Musculoskeletal: Positive for arthralgias, joint pain, muscle weakness and morning stiffness. Negative for joint swelling, myalgias, muscle tenderness  and myalgias.  Skin: Negative for color change, pallor, rash, hair loss, nodules/bumps, skin tightness, ulcers and sensitivity to sunlight.  Allergic/Immunologic: Negative for susceptible to infections.  Neurological: Negative for dizziness, numbness, headaches, memory loss, night sweats and weakness.  Hematological: Negative for bruising/bleeding tendency and swollen glands.  Psychiatric/Behavioral: Positive for sleep disturbance. Negative for depressed mood. The patient is not nervous/anxious.     PMFS History:  Patient Active Problem List   Diagnosis Date Noted  . 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 History of cholecystectomy 07/30/2016  . Diabetes mellitus ([place]) 01/29/2016  . Impetigo 01/09/2016  . Anniversary reaction 12/20/2015  . Sacroiliitis ([place]) 12/19/2015  . Primary osteoarthritis of both knees 12/19/2015  . Primary osteoarthritis of both feet 12/19/2015  . Depression 12/19/2015  . Peptic ulcer disease 12/19/2015  . Hemorrhoids 12/19/2015  . S/p bilateral carpal tunnel release 12/19/2015  . Dyslipidemia 12/19/2015  . Intermittent asthma without complication 34/19/3790  . Environmental allergies 12/19/2015  . Attention deficit hyperactivity disorder (ADHD) 12/19/2015  . Ulcerative colitis ([place]) 12/18/2015  . High risk medication use 12/18/2015  . Need for hepatitis C screening test 07/12/2015  . Need for prophylactic vaccination against Streptococcus pneumoniae (pneumococcus) 02/07/2015  . Type 2 diabetes mellitus without complication, without long-term current use of insulin ([place]) 02/07/2015  . Anemia 06/13/2013  . Injury of hand, right 06/13/2013  . Prediabetes 06/13/2013  . Anxiety state 12/14/2012  . Hyperlipidemia 12/14/2012  . Hypertension, benign 12/14/2012  . Breast mass 07/09/2012  . Herpes zoster 06/19/2012  . Abnormal mammogram 06/09/2012    Past Medical History:  Diagnosis Date  . Arthritis   . Asthma   .  High cholesterol   . Hypertension     . Pre-diabetes   . Seasonal allergies     Family History  Problem Relation Age of Onset  . Diabetes Mother   . Hypertension Mother   . Hyperlipidemia Mother   . COPD Father   . Arthritis Father   . Thyroid disease Sister   . Asthma Sister   . ADD / ADHD Son   . Down syndrome Daughter    Past Surgical History:  Procedure Laterality Date  . CARPAL TUNNEL RELEASE  unsure   bilaterally  . CESAREAN SECTION  12/01  . CHOLECYSTECTOMY  12/01  . PARTIAL MASTECTOMY WITH NEEDLE LOCALIZATION Right 07/09/2012   Procedure: RIGHT PARTIAL MASTECTOMY WITH NEEDLE LOCALIZATION;  Surgeon: Adin Hector, MD;  Location: [facility];  Service: General;  Laterality: Right;  . SHOULDER OPEN ROTATOR CUFF REPAIR  2009   rt shoulder  . TOTAL SHOULDER ARTHROPLASTY     Social History   Social History Narrative  . Not on file   Immunization History  Administered Date(s) Administered  . Influenza, Seasonal, Injecte, Preservative Fre 09/14/2010, 10/09/2016  . Influenza,inj,Quad PF,6+ Mos 10/10/2015  . Influenza-Unspecified 11/09/2013, 10/14/2014  . Pneumococcal Conjugate-13 02/07/2015  . Pneumococcal Polysaccharide-23 09/06/2013    Objective: Vital Signs: BP 126/71 (BP Location: Left Arm, Patient Position: Sitting, Cuff Size: Normal)   Pulse 94   Resp 16   Ht 5' 5"  (1.651 m)   Wt 247 lb 3.2 oz (112.1 kg)   LMP 05/19/2012   BMI 41.14 kg/m    Physical Exam  Constitutional: She is oriented to person, place, and time. She appears well-developed and well-nourished.  HENT:  Head: Normocephalic and atraumatic.  Eyes: Conjunctivae and EOM are normal.  Neck: Normal range of motion.  Cardiovascular: Normal rate, regular rhythm, normal heart sounds and intact distal pulses.  Pulmonary/Chest: Effort normal and breath sounds normal.  Abdominal: Soft. Bowel sounds are normal.  Lymphadenopathy:    She has no cervical adenopathy.  Neurological: She is alert and oriented to person,  place, and time.  Skin: Skin is warm and dry. Capillary refill takes less than 2 seconds.  Psychiatric: She has a normal mood and affect. Her behavior is normal.  Nursing note and vitals reviewed.    Musculoskeletal Exam: C-spine thoracic lumbar spine good range of motion.  Shoulder joints elbow joints wrist joints good range of motion.  She has DIP and PIP thickening in her hands consistent with osteoarthritis.  She has some crepitus in her knee joints without any warmth swelling or effusion.  She has tenderness on palpation of her right SI joint.  CDAI Exam: CDAI Score: Not documented Patient Global Assessment: 6 (mm); Provider Global Assessment: Not documented Swollen: 0 ; Tender: 3  Joint Exam      Right  Left  Sacroiliac   Tender     Ankle   Tender   Tender     Investigation: No additional findings.  Imaging: No results found.  Recent Labs: Lab Results  Component Value Date   WBC 9.5 11/04/2017   HGB 12.3 11/04/2017   PLT 325 11/04/2017   NA 138 11/04/2017   K 3.9 11/04/2017   CL 101 11/04/2017   CO2 27 11/04/2017   GLUCOSE 99 11/04/2017   BUN 15 11/04/2017   CREATININE 0.87 11/04/2017   BILITOT 0.3 11/04/2017   ALKPHOS 97 07/30/2016   AST 27 11/04/2017   ALT 31 (H) 11/04/2017   PROT 7.3  11/04/2017   ALBUMIN 4.1 07/30/2016   CALCIUM 9.3 11/04/2017   GFRAA 88 11/04/2017   QFTBGOLDPLUS NEGATIVE 07/29/2017    Speciality Comments: No specialty comments available.  Procedures:  No procedures performed Allergies: Other; Tape; and Tree extract   Assessment / Plan:     Visit Diagnoses: Other ulcerative colitis without complication ([place]) - Patient is followed by Dr. Collene Mares.  She denies any recent flare.  High risk medication use - Current regimen includes Humira 59m/0.4 ml subq every 14 days, methotrexate 10 mg po weekly, and folic acid 2 mg po qd.  Most recent TB gold negative 07/29/17.  Last CBC/CMP within normal limits except for borderline elevated ALT at 31  on 11/04/17.  Next CBC/CMP due on end of January and standing orders in place. Recommend annual flu and Shingrix as indicated.   Sacroiliitis (HCC)-she has been experiencing some right SI joint discomfort.  She has some relief from cortisone injection lasting for only 2 months.  We decided not to inject today.  Stretching exercises were emphasized.  Weight loss diet and exercise was discussed.  Primary osteoarthritis of both knees-she has chronic pain in her knee joints.  Primary osteoarthritis of both feet-she has chronic pain in her feet.  S/p bilateral carpal tunnel release-doing well.  Class III severe obesity-weight loss diet and exercise was discussed at length.  Other medical problems are listed as follows:  History of attention deficit disorder  History of asthma  History of cholecystectomy  History of peptic ulcer disease   Orders: No orders of the defined types were placed in this encounter.  No orders of the defined types were placed in this encounter.   Face-to-face time spent with patient was 30 minutes. Greater than 50% of time was spent in counseling and coordination of care.  Follow-Up Instructions: Return for UC, Osteoarthritis.   SBo Merino MD  Note - This record has been created using DEditor, commissioning  Chart creation errors have been sought, but may not always  have been located. Such creation errors do not reflect on  the standard of medical care.

## 2017-11-06 NOTE — Telephone Encounter (Signed)
Received fax from [facility] stating that preferred pharmacy has now switched to [facility].  Current prescription for Humira has been transferred to [facility].  Adjusted preferred specialty pharmacy to [facility]. Will send document to scan center.

## 2017-11-19 ENCOUNTER — Telehealth: Payer: Self-pay | Admitting: Pharmacist

## 2017-11-19 ENCOUNTER — Ambulatory Visit (INDEPENDENT_AMBULATORY_CARE_PROVIDER_SITE_OTHER): Payer: Managed Care, Other (non HMO) | Admitting: Rheumatology

## 2017-11-19 ENCOUNTER — Encounter: Payer: Self-pay | Admitting: Rheumatology

## 2017-11-19 VITALS — BP 126/71 | HR 94 | Resp 16 | Ht 65.0 in | Wt 247.2 lb

## 2017-11-19 DIAGNOSIS — Z79899 Other long term (current) drug therapy: Secondary | ICD-10-CM

## 2017-11-19 DIAGNOSIS — K5180 Other ulcerative colitis without complications: Secondary | ICD-10-CM | POA: Diagnosis not present

## 2017-11-19 DIAGNOSIS — Z9049 Acquired absence of other specified parts of digestive tract: Secondary | ICD-10-CM

## 2017-11-19 DIAGNOSIS — M19071 Primary osteoarthritis, right ankle and foot: Secondary | ICD-10-CM

## 2017-11-19 DIAGNOSIS — Z9889 Other specified postprocedural states: Secondary | ICD-10-CM

## 2017-11-19 DIAGNOSIS — Z8711 Personal history of peptic ulcer disease: Secondary | ICD-10-CM

## 2017-11-19 DIAGNOSIS — Z8709 Personal history of other diseases of the respiratory system: Secondary | ICD-10-CM

## 2017-11-19 DIAGNOSIS — M461 Sacroiliitis, not elsewhere classified: Secondary | ICD-10-CM

## 2017-11-19 DIAGNOSIS — Z6841 Body Mass Index (BMI) 40.0 and over, adult: Secondary | ICD-10-CM

## 2017-11-19 DIAGNOSIS — M170 Bilateral primary osteoarthritis of knee: Secondary | ICD-10-CM | POA: Diagnosis not present

## 2017-11-19 DIAGNOSIS — Z8659 Personal history of other mental and behavioral disorders: Secondary | ICD-10-CM

## 2017-11-19 DIAGNOSIS — M19072 Primary osteoarthritis, left ankle and foot: Secondary | ICD-10-CM

## 2017-11-19 NOTE — Patient Instructions (Signed)
Recommend annual flu vaccine and Shingrix vaccine as indicated.   Standing Labs We placed an order today for your standing lab work.    Please come back and get your standing labs in January and every 3 months  We have open lab Monday through Friday from 8:30-11:30 AM and 1:30-4:00 PM  at the office of Dr. Bo Merino.   You may experience shorter wait times on Monday and Friday afternoons. The office is located at [address], [place], [place], [place] [address] No appointment is necessary.   Labs are drawn by [facility].  You may receive a bill from [facility] for your lab work. If you have any questions regarding directions or hours of operation,  please call [phone].   Just as a reminder please drink plenty of water prior to coming for your lab work. Thanks!

## 2017-11-19 NOTE — Telephone Encounter (Signed)
Patient brought document she received from [place] her insurance plan.  Document states that starting January 13, 2018 they have a quantity limit for Humira pens which is 2 pens per 28 days.  Patient is currently taking Humira every 14 days so should not be an issue.  If frequency is increased we will have to do a prior authorization.  Will send document to scan center.

## 2017-12-24 ENCOUNTER — Telehealth: Payer: Self-pay | Admitting: Rheumatology

## 2017-12-24 MED ORDER — METHOTREXATE 2.5 MG PO TABS: ORAL_TABLET | ORAL | 0 refills | Status: DC

## 2017-12-24 NOTE — Telephone Encounter (Signed)
Last Visit: 11/19/17 Next Visit:04/21/18 Labs: 11/04/17 ALT borderline elevated-31. All other labs are WNL.  Okay to refill per Dr. Estanislado Pandy

## 2017-12-24 NOTE — Telephone Encounter (Signed)
Patient called requesting prescription refill of Methotrexate to be sent to [facility].  Patient is due to take her medication on Saturday 12/24/17.

## 2018-01-04 ENCOUNTER — Other Ambulatory Visit: Payer: Self-pay | Admitting: Rheumatology

## 2018-01-04 NOTE — Telephone Encounter (Signed)
Last visit: 11/19/17 Next Visit: 04/21/18 Labs: 11/04/17 ALT borderline elevated-31. All other labs are WNL. TB Gold: 07/29/17 Neg   Okay to refill per Dr. Estanislado Pandy

## 2018-01-04 NOTE — Telephone Encounter (Signed)
Last visit: 11/19/17 Next Visit: 04/21/18  Okay to refill per Dr. Estanislado Pandy

## 2018-03-10 ENCOUNTER — Other Ambulatory Visit: Payer: Self-pay | Admitting: Rheumatology

## 2018-03-10 NOTE — Telephone Encounter (Signed)
Last visit: 11/19/17 Next Visit: 04/21/18 Labs: 11/04/17 ALT borderline elevated-31. All other labs are WNL. TB Gold: 07/29/17 Neg   Patient advised she is due to update labs. Patient will update labs today.

## 2018-03-11 ENCOUNTER — Other Ambulatory Visit: Payer: Self-pay

## 2018-03-11 DIAGNOSIS — Z79899 Other long term (current) drug therapy: Secondary | ICD-10-CM

## 2018-03-11 LAB — CBC WITH DIFFERENTIAL/PLATELET
Absolute Monocytes: 609 cells/uL (ref 200–950)
BASOS ABS: 52 {cells}/uL (ref 0–200)
Basophils Relative: 0.6 %
EOS PCT: 1.8 %
Eosinophils Absolute: 157 cells/uL (ref 15–500)
HEMATOCRIT: 38.8 % (ref 35.0–45.0)
Hemoglobin: 12.9 g/dL (ref 11.7–15.5)
LYMPHS ABS: 3567 {cells}/uL (ref 850–3900)
MCH: 29.3 pg (ref 27.0–33.0)
MCHC: 33.2 g/dL (ref 32.0–36.0)
MCV: 88 fL (ref 80.0–100.0)
MPV: 10.1 fL (ref 7.5–12.5)
Monocytes Relative: 7 %
NEUTROS PCT: 49.6 %
Neutro Abs: 4315 cells/uL (ref 1500–7800)
Platelets: 358 10*3/uL (ref 140–400)
RBC: 4.41 10*6/uL (ref 3.80–5.10)
RDW: 13.8 % (ref 11.0–15.0)
Total Lymphocyte: 41 %
WBC: 8.7 10*3/uL (ref 3.8–10.8)

## 2018-03-11 LAB — COMPLETE METABOLIC PANEL WITH GFR
AG RATIO: 1.2 (calc) (ref 1.0–2.5)
ALBUMIN MSPROF: 4.2 g/dL (ref 3.6–5.1)
ALT: 22 U/L (ref 6–29)
AST: 20 U/L (ref 10–35)
Alkaline phosphatase (APISO): 106 U/L (ref 37–153)
BILIRUBIN TOTAL: 0.4 mg/dL (ref 0.2–1.2)
BUN: 14 mg/dL (ref 7–25)
CHLORIDE: 101 mmol/L (ref 98–110)
CO2: 28 mmol/L (ref 20–32)
Calcium: 10.1 mg/dL (ref 8.6–10.4)
Creat: 0.93 mg/dL (ref 0.50–1.05)
GFR, EST AFRICAN AMERICAN: 81 mL/min/{1.73_m2} (ref >=60)
GFR, Est Non African American: 70 mL/min/{1.73_m2} (ref >=60)
GLOBULIN: 3.4 g/dL (ref 1.9–3.7)
GLUCOSE: 80 mg/dL (ref 65–99)
Potassium: 4.3 mmol/L (ref 3.5–5.3)
Sodium: 138 mmol/L (ref 135–146)
TOTAL PROTEIN: 7.6 g/dL (ref 6.1–8.1)

## 2018-03-11 NOTE — Telephone Encounter (Signed)
Patient updated labs 03/11/18  Okay to refill per Dr. Estanislado Pandy

## 2018-04-14 NOTE — Progress Notes (Signed)
Virtual Visit via Telephone Note  I connected with Mercedes [name] on 04/21/18 at  1:00 PM EDT by telephone and verified that I am speaking with the correct person using two identifiers.   I discussed the limitations, risks, security and privacy concerns of performing an evaluation and management service by telephone and the availability of in person appointments. I also discussed with the patient that there may be a patient responsible charge related to this service. The patient expressed understanding and agreed to proceed.  CC: Medication monitoring   History of Present Illness: Patient is a 53 year old female with a past medical history of ulcerative colitis, sacroiliitis, and osteoarthritis.    She is on Humira 40 mg sq once every 14 days, MTX 10 mg po (4 tablets) weekly, and folic acid 2 mg po daily. She denies any SI joint pain at this time.  She denies any knee joint or feet pain. She denies any joint swelling. She has been feeling good overall.  She denies any UC flares.  She denies any abdominal pain or blood in stool.  She continues to follow up with Dr. Collene Mares.     Review of Systems  Constitutional: Negative for fever and malaise/fatigue.  Eyes: Negative for photophobia, pain, discharge and redness.       +eye dryness  Respiratory: Negative for cough, shortness of breath and wheezing.   Cardiovascular: Negative for chest pain and palpitations.  Gastrointestinal: Negative for blood in stool, constipation and diarrhea.  Genitourinary: Negative for dysuria.  Musculoskeletal: Negative for back pain, joint pain, myalgias and neck pain.  Skin: Negative for rash.  Neurological: Negative for dizziness and headaches.  Psychiatric/Behavioral: Negative for depression. The patient is not nervous/anxious and does not have insomnia.    Observations/Objective: Physical Exam  Constitutional: She is oriented to person, place, and time.  Neurological: She is alert and oriented to person, place, and  time.  Psychiatric: Mood, memory, affect and judgment normal.    Patient reports morning stiffness for5 minutes.   Patient denies nocturnal pain.  Difficulty dressing/grooming: Denies Difficulty climbing stairs: Denies Difficulty getting out of chair: Reports Difficulty using hands for taps, buttons, cutlery, and/or writing: Reports  Assessment and Plan: Other ulcerative colitis without complication ([place]) - Patient is followed by Dr. Collene Mares.  She has not had any recent flares.  She has no abdominal pain, diarrhea, or constipation at this time.  No blood in her stool. She is clinically doing well on Humira 40 mg sq injections every 2 weeks, MTX 4 tablets po once weekly, and folic acid 1 mg po daily.  She has not missed any doses recently.  She will continue on this current treatment regimen.  She will follow up in our office in 4 months.  High risk medication use - Current regimen includes Humira 32m/0.4 ml subq every 14 days, methotrexate 10 mg po weekly, and folic acid 2 mg po qd.  Most recent TB gold negative 07/29/17. Future order for TB gold placed today.  CBC and CMP are WNL on 03/11/18.    Sacroiliitis (HCC)-She has no SI joint pain at this time.   Primary osteoarthritis of both knees-She has no knee joint pain or joint swelling at this time.  She has some difficulty getting up from a chair due to LE deconditioning.  She is trying to walk more for exercise.   Primary osteoarthritis of both feet-She has no discomfort in her feet at this time.   S/p bilateral carpal  tunnel release-Doing well.  Asymptomatic at this time.    Follow Up Instructions: She will follow up in our office in 4 months.  She will be due for lab work in May.  Standing orders for CBC and CMP are in place.  A future order for TB gold will be placed today.   I discussed the assessment and treatment plan with the patient. The patient was provided an opportunity to ask questions and all were answered. The patient  agreed with the plan and demonstrated an understanding of the instructions.   The patient was advised to call back or seek an in-person evaluation if the symptoms worsen or if the condition fails to improve as anticipated.  I provided 15 minutes of non-face-to-face time during this encounter. Bo Merino, MD   Scribed by-  Ofilia Neas, PA-C

## 2018-04-21 ENCOUNTER — Telehealth (INDEPENDENT_AMBULATORY_CARE_PROVIDER_SITE_OTHER): Payer: Managed Care, Other (non HMO) | Admitting: Rheumatology

## 2018-04-21 ENCOUNTER — Encounter: Payer: Self-pay | Admitting: Rheumatology

## 2018-04-21 ENCOUNTER — Ambulatory Visit: Payer: Managed Care, Other (non HMO) | Admitting: Physician Assistant

## 2018-04-21 DIAGNOSIS — K5180 Other ulcerative colitis without complications: Secondary | ICD-10-CM | POA: Diagnosis not present

## 2018-04-21 DIAGNOSIS — M461 Sacroiliitis, not elsewhere classified: Secondary | ICD-10-CM

## 2018-04-21 DIAGNOSIS — M19072 Primary osteoarthritis, left ankle and foot: Secondary | ICD-10-CM

## 2018-04-21 DIAGNOSIS — Z8709 Personal history of other diseases of the respiratory system: Secondary | ICD-10-CM

## 2018-04-21 DIAGNOSIS — Z8711 Personal history of peptic ulcer disease: Secondary | ICD-10-CM

## 2018-04-21 DIAGNOSIS — Z9889 Other specified postprocedural states: Secondary | ICD-10-CM

## 2018-04-21 DIAGNOSIS — M170 Bilateral primary osteoarthritis of knee: Secondary | ICD-10-CM

## 2018-04-21 DIAGNOSIS — Z79899 Other long term (current) drug therapy: Secondary | ICD-10-CM

## 2018-04-21 DIAGNOSIS — Z8659 Personal history of other mental and behavioral disorders: Secondary | ICD-10-CM

## 2018-04-21 DIAGNOSIS — M19071 Primary osteoarthritis, right ankle and foot: Secondary | ICD-10-CM

## 2018-04-22 ENCOUNTER — Telehealth: Payer: Self-pay | Admitting: Rheumatology

## 2018-04-22 NOTE — Telephone Encounter (Signed)
I LMOM for patient to call ,and schedule next 4 month follow up appt with Hazel Sams, PAC.

## 2018-04-22 NOTE — Telephone Encounter (Signed)
-----   Message from [place] sent at 04/21/2018 12:31 PM EDT ----- Patient had virtual visit today with Dr. Estanislado Pandy. Please call to schedule 4 month follow up. Thanks!

## 2018-05-31 IMAGING — MG 2D DIGITAL DIAGNOSTIC UNILATERAL RIGHT MAMMOGRAM WITH CAD AND AD
6 series · 6 of 14 positions shown · non-contrast
Comparison: Previous exam(s).

CLINICAL DATA: Screening recall for a possible asymmetry seen in
the right breast laterally on the CC view only.Patient has no
current complaints.

EXAM:
2D DIGITAL DIAGNOSTIC UNILATERAL RIGHT MAMMOGRAM WITH CAD AND
ADJUNCT TOMO

[R ML synth-2D]
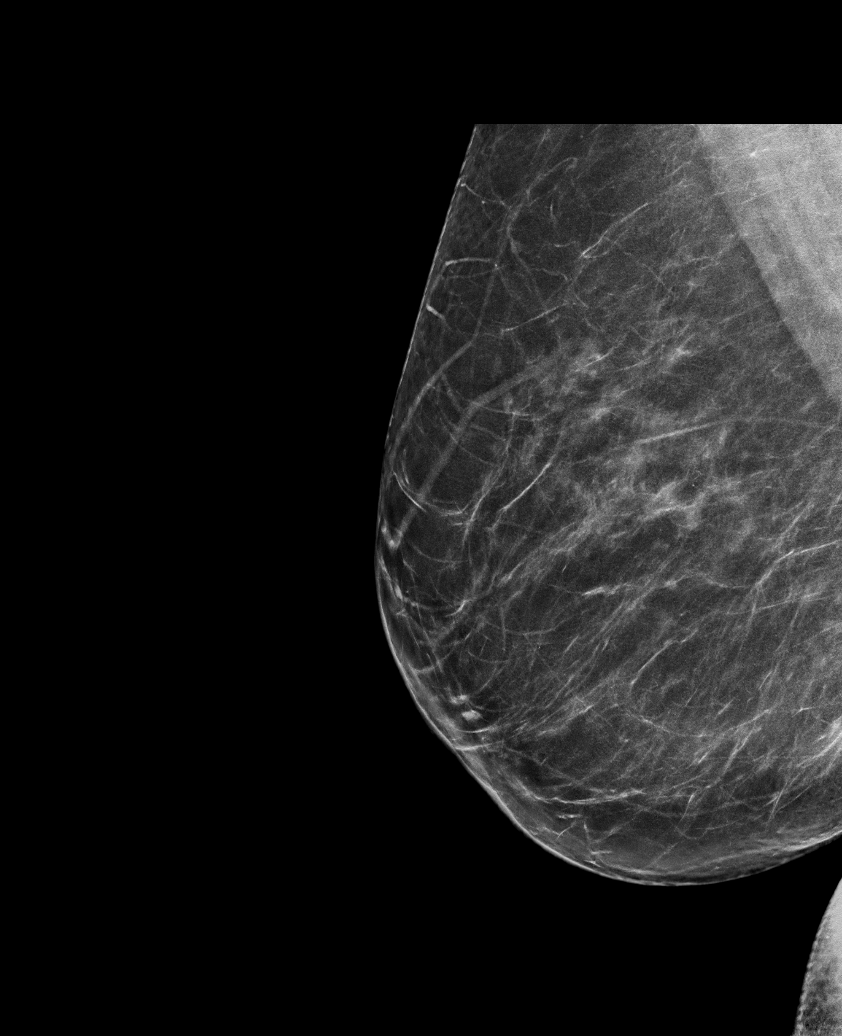

[R CC synth-2D]
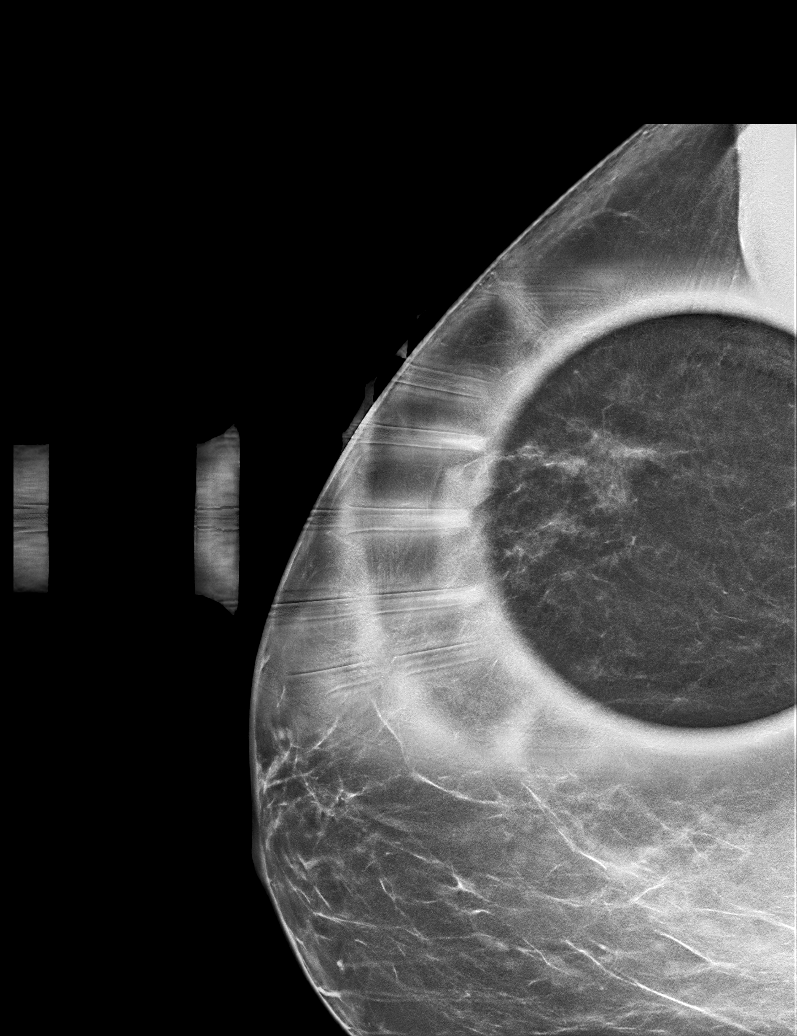

[R CC]
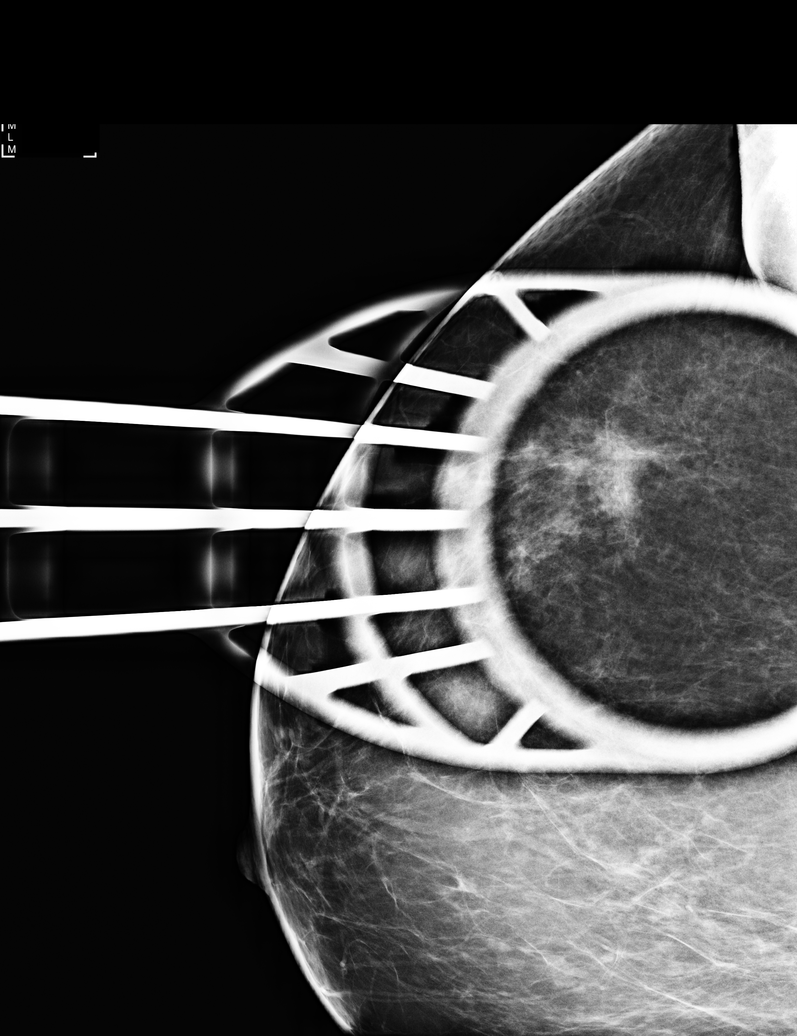

[R ML]
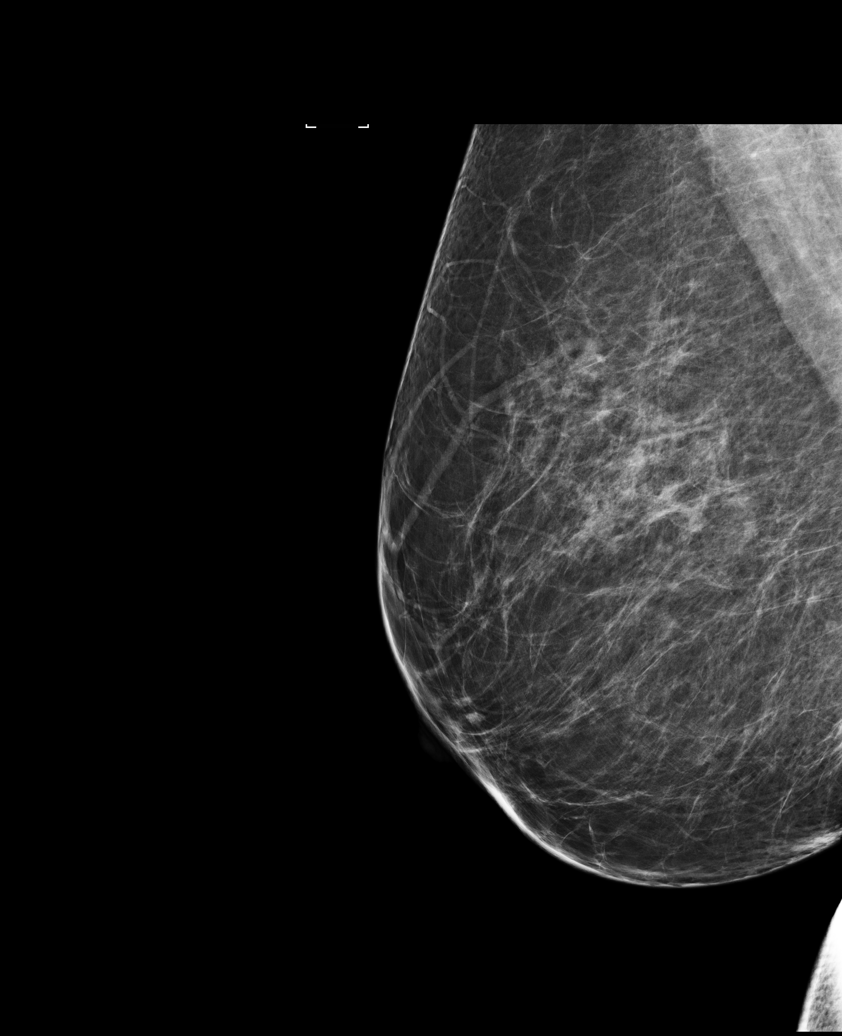

[R CC tomo · tomo slice 35/70.0]
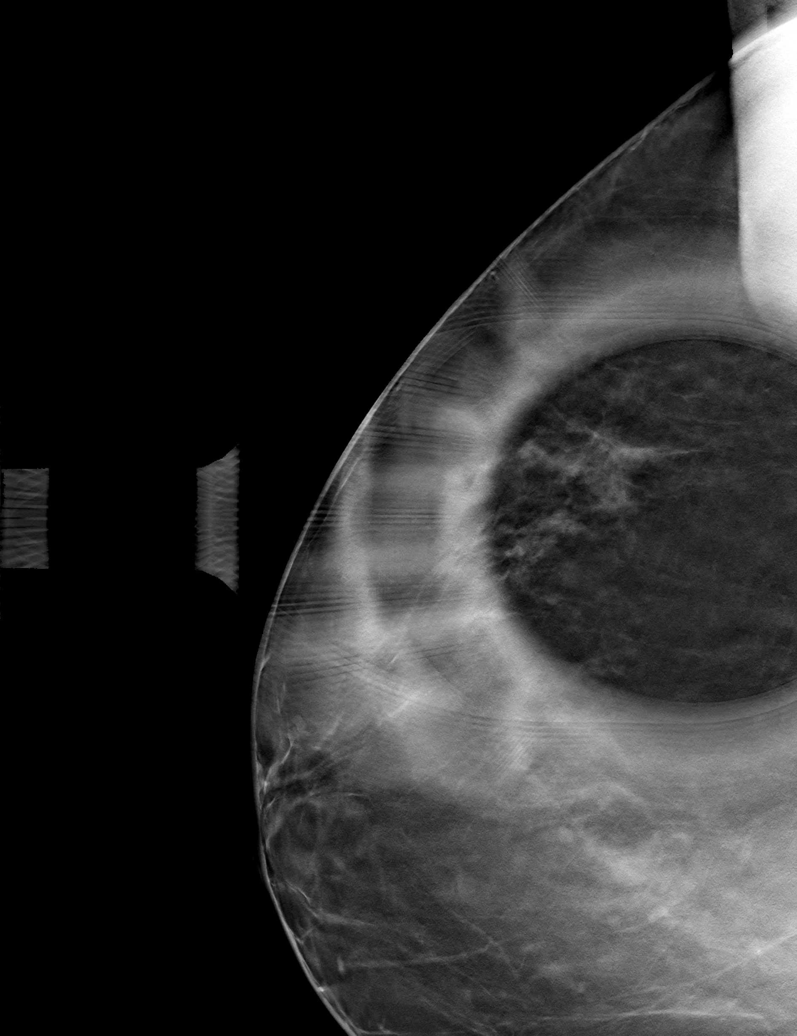

[R ML tomo · tomo slice 41/81.0]
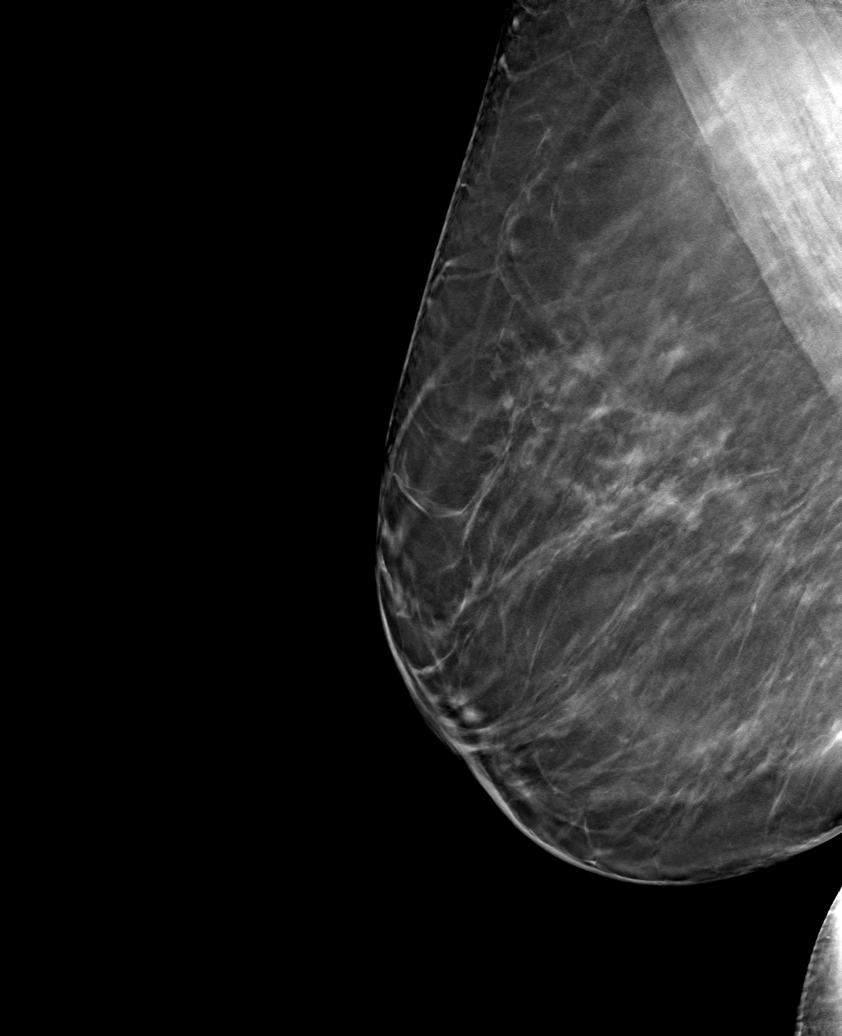

[6 of 14 positions shown; findings below may reference images not displayed]

ACR Breast Density Category b: There are scattered areas of
fibroglandular density.
FINDINGS: On the diagnostic spot-compression 3D images, the focal opacity
noted on the current screening study appears as closely approximated
normal fibroglandular tissue with no discrete mass and no
architectural distortion. The diagnostic 2D and 3D MLO views are
unremarkable.

Mammographic images were processed with CAD.
IMPRESSION: No evidence of malignancy. Questionable asymmetry noted on the
screening study was due to closely approximated fibroglandular
tissue in the lateral right breast.

RECOMMENDATION:
Screening mammogram in one year.(Code:[id number])

I have discussed the findings and recommendations with the patient.
Results were also provided in writing at the conclusion of the
visit. If applicable, a reminder letter will be sent to the patient
regarding the next appointment.

BI-RADS CATEGORY  1: Negative.

## 2018-08-19 ENCOUNTER — Other Ambulatory Visit: Payer: Self-pay | Admitting: Rheumatology

## 2018-08-19 NOTE — Telephone Encounter (Addendum)
Last visit: 04/21/18 Next visit: 09/21/18 Labs: 03/11/18 WNL  Left message to advise patient she is due for labs  Okay to refill 30 day supply MTX?

## 2018-08-19 NOTE — Telephone Encounter (Signed)
Patient needs to update lab work prior to refilling MTX

## 2018-09-06 ENCOUNTER — Other Ambulatory Visit: Payer: Self-pay

## 2018-09-06 DIAGNOSIS — Z79899 Other long term (current) drug therapy: Secondary | ICD-10-CM

## 2018-09-07 NOTE — Progress Notes (Signed)
Office Visit Note  Patient: Mercedes [name]             Date of Birth: 1965/03/04           MRN: [id number]             PCP: Libby Maw, MD Referring: Libby Maw,* Visit Date: 09/21/2018 Occupation: @GUAROCC @  Subjective:  Right SI joint pain    History of Present Illness: Mercedes [name] is a 53 y.o. female with history of ulcerative colitis and osteoarthritis.  She is on Humira 40 mg sq injections every 14 days, MTX 4 tablets po once weekly, and folic acid 1 mg po daily.  Patient denies any flares of her ulcerative colitis.  She states she has been having off-and-on discomfort in her knee joints especially with the weather change.  She has had no flares of sacroiliitis but since yesterday she has been having increased pain in her right SI joint.  She complains of some TMJ stiffness.  She states sometimes her fingers lock up in her left hand and she has difficulty writing.  She denies any joint swelling.  Activities of Daily Living:  Patient reports morning stiffness for 30 minutes.   Patient Reports nocturnal pain.  Difficulty dressing/grooming: Denies Difficulty climbing stairs: Reports Difficulty getting out of chair: Reports Difficulty using hands for taps, buttons, cutlery, and/or writing: Reports  Review of Systems  Constitutional: Negative for fatigue, night sweats, weight gain and weight loss.  HENT: Negative for mouth sores, trouble swallowing, trouble swallowing, mouth dryness and nose dryness.   Eyes: Negative for pain, redness, visual disturbance and dryness.  Respiratory: Negative for cough, hemoptysis, shortness of breath and difficulty breathing.   Cardiovascular: Negative for chest pain, palpitations, hypertension, irregular heartbeat and swelling in legs/feet.  Gastrointestinal: Positive for diarrhea. Negative for blood in stool and constipation.  Endocrine: Negative for increased urination.  Genitourinary: Negative for painful urination and  vaginal dryness.  Musculoskeletal: Positive for arthralgias, joint pain, muscle weakness and morning stiffness. Negative for myalgias, muscle tenderness and myalgias.  Skin: Negative for color change, pallor, rash, hair loss, nodules/bumps, skin tightness, ulcers and sensitivity to sunlight.  Allergic/Immunologic: Negative for susceptible to infections.  Neurological: Negative for dizziness, numbness, headaches, memory loss, night sweats and weakness.  Hematological: Negative for swollen glands.  Psychiatric/Behavioral: Positive for sleep disturbance. Negative for depressed mood. The patient is not nervous/anxious.     PMFS History:  Patient Active Problem List   Diagnosis Date Noted  . 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 History of cholecystectomy 07/30/2016  . Diabetes mellitus ([place]) 01/29/2016  . Impetigo 01/09/2016  . Anniversary reaction 12/20/2015  . Sacroiliitis ([place]) 12/19/2015  . Primary osteoarthritis of both knees 12/19/2015  . Primary osteoarthritis of both feet 12/19/2015  . Depression 12/19/2015  . Peptic ulcer disease 12/19/2015  . Hemorrhoids 12/19/2015  . S/p bilateral carpal tunnel release 12/19/2015  . Dyslipidemia 12/19/2015  . Intermittent asthma without complication 75/10/2583  . Environmental allergies 12/19/2015  . Attention deficit hyperactivity disorder (ADHD) 12/19/2015  . Ulcerative colitis ([place]) 12/18/2015  . High risk medication use 12/18/2015  . Need for hepatitis C screening test 07/12/2015  . Need for prophylactic vaccination against Streptococcus pneumoniae (pneumococcus) 02/07/2015  . Type 2 diabetes mellitus without complication, without long-term current use of insulin ([place]) 02/07/2015  . Anemia 06/13/2013  . Injury of hand, right 06/13/2013  . Prediabetes 06/13/2013  . Anxiety state 12/14/2012  . Hyperlipidemia 12/14/2012  .  Hypertension, benign 12/14/2012  . Breast mass 07/09/2012  . Herpes zoster 06/19/2012  . Abnormal mammogram  06/09/2012    Past Medical History:  Diagnosis Date  . Arthritis   . Asthma   . High cholesterol   . Hypertension   . Pre-diabetes   . Seasonal allergies     Family History  Problem Relation Age of Onset  . Diabetes Mother   . Hypertension Mother   . Hyperlipidemia Mother   . COPD Father   . Arthritis Father   . Thyroid disease Sister   . Asthma Sister   . ADD / ADHD Son   . Down syndrome Daughter    Past Surgical History:  Procedure Laterality Date  . CARPAL TUNNEL RELEASE  unsure   bilaterally  . CESAREAN SECTION  12/01  . CHOLECYSTECTOMY  12/01  . PARTIAL MASTECTOMY WITH NEEDLE LOCALIZATION Right 07/09/2012   Procedure: RIGHT PARTIAL MASTECTOMY WITH NEEDLE LOCALIZATION;  Surgeon: Adin Hector, MD;  Location: [facility];  Service: General;  Laterality: Right;  . SHOULDER OPEN ROTATOR CUFF REPAIR  2009   rt shoulder  . TOTAL SHOULDER ARTHROPLASTY     Social History   Social History Narrative  . Not on file   Immunization History  Administered Date(s) Administered  . Influenza, Seasonal, Injecte, Preservative Fre 09/14/2010, 10/09/2016  . Influenza,inj,Quad PF,6+ Mos 10/10/2015, 11/10/2017  . Influenza-Unspecified 11/09/2013, 10/14/2014  . Pneumococcal Conjugate-13 02/07/2015  . Pneumococcal Polysaccharide-23 09/06/2013     Objective: Vital Signs: BP 101/60 (BP Location: Left Arm, Patient Position: Sitting, Cuff Size: Large)   Pulse 82   Resp 15   Ht 5' 5"  (1.651 m)   Wt 241 lb 9.6 oz (109.6 kg)   LMP 05/19/2012   BMI 40.20 kg/m    Physical Exam Vitals signs and nursing note reviewed.  Constitutional:      Appearance: She is well-developed.  HENT:     Head: Normocephalic and atraumatic.  Eyes:     Conjunctiva/sclera: Conjunctivae normal.  Neck:     Musculoskeletal: Normal range of motion.  Cardiovascular:     Rate and Rhythm: Normal rate and regular rhythm.     Heart sounds: Normal heart sounds.  Pulmonary:     Effort:  Pulmonary effort is normal.     Breath sounds: Normal breath sounds.  Abdominal:     General: Bowel sounds are normal.     Palpations: Abdomen is soft.  Lymphadenopathy:     Cervical: No cervical adenopathy.  Skin:    General: Skin is warm and dry.     Capillary Refill: Capillary refill takes less than 2 seconds.  Neurological:     Mental Status: She is alert and oriented to person, place, and time.  Psychiatric:        Behavior: Behavior normal.      Musculoskeletal Exam: C-spine, thoracic and lumbar spine with good range of motion.  She has some tenderness over right SI joint.  Shoulder joints, elbow joints and wrist joints with good range of motion.  She has DIP and PIP thickening with no synovitis.  Hip joints, knee joints, ankles, MTPs with good range of motion with no synovitis.  CDAI Exam: CDAI Score: - Patient Global: -; Provider Global: - Swollen: -; Tender: - Joint Exam   No joint exam has been documented for this visit   There is currently no information documented on the homunculus. Go to the Rheumatology activity and complete the homunculus joint exam.  Investigation: No additional findings.  Imaging: No results found.  Recent Labs: Lab Results  Component Value Date   WBC 8.1 09/06/2018   HGB 12.2 09/06/2018   PLT 317 09/06/2018   NA 139 09/06/2018   K 4.4 09/06/2018   CL 104 09/06/2018   CO2 27 09/06/2018   GLUCOSE 83 09/06/2018   BUN 12 09/06/2018   CREATININE 0.79 09/06/2018   BILITOT 0.3 09/06/2018   ALKPHOS 97 07/30/2016   AST 25 09/06/2018   ALT 28 09/06/2018   PROT 7.3 09/06/2018   ALBUMIN 4.1 07/30/2016   CALCIUM 9.3 09/06/2018   GFRAA 98 09/06/2018   QFTBGOLDPLUS NEGATIVE 09/06/2018    Speciality Comments: No specialty comments available.  Procedures:  No procedures performed Allergies: Other, Tape, and Tree extract   Assessment / Plan:     Visit Diagnoses: Other ulcerative colitis without complication ([place]) - Patient is followed  by Dr. Collene Mares.  Patient denies having a flare of ulcerative colitis.  She states she still have some loose stools every day which is baseline for her.  She mentioned that she is due for a colonoscopy.  High risk medication use -Humira 40 mg every 14 days, methotrexate 4 tablets every 7 days, and folic acid 1 mg daily.  Last TB gold negative 09/06/2018 and will monitor yearly.  Most recent CBC/CMP within normal limits on 09/06/2018 and will monitor every 3 months.   Sacroiliitis (H[facility])-she is having some discomfort in her right SI joint.  I offered cortisone injection which she declined.  She believes some of those discomfort are related to weather change.  Primary osteoarthritis of both knees-she continues to have some discomfort in her knees.  Primary osteoarthritis of both feet-currently not having much discomfort.  Other medical problems are listed as follows:  S/p bilateral carpal tunnel release  History of peptic ulcer disease  History of attention deficit disorder  History of asthma  History of cholecystectomy  Orders: No orders of the defined types were placed in this encounter.  Meds ordered this encounter  Medications  . methotrexate (RHEUMATREX) 2.5 MG tablet    Sig: TAKE 4 TABLETS ONE TIME WEEKLY. CAUTION, CHEMOTHERAPY, PROTECT FROM LIGHT    Dispense:  48 tablet    Refill:  0      Follow-Up Instructions: Return in 5 months (on 02/21/2019) for UC, Osteoarthritis.   Bo Merino, MD  Note - This record has been created using Editor, commissioning.  Chart creation errors have been sought, but may not always  have been located. Such creation errors do not reflect on  the standard of medical care.

## 2018-09-08 ENCOUNTER — Other Ambulatory Visit: Payer: Self-pay | Admitting: Rheumatology

## 2018-09-08 LAB — COMPLETE METABOLIC PANEL WITH GFR
AG Ratio: 1.3 (calc) (ref 1.0–2.5)
ALT: 28 U/L (ref 6–29)
AST: 25 U/L (ref 10–35)
Albumin: 4.1 g/dL (ref 3.6–5.1)
Alkaline phosphatase (APISO): 97 U/L (ref 37–153)
BUN: 12 mg/dL (ref 7–25)
CO2: 27 mmol/L (ref 20–32)
Calcium: 9.3 mg/dL (ref 8.6–10.4)
Chloride: 104 mmol/L (ref 98–110)
Creat: 0.79 mg/dL (ref 0.50–1.05)
GFR, Est African American: 98 mL/min/{1.73_m2} (ref >=60)
GFR, Est Non African American: 85 mL/min/{1.73_m2} (ref >=60)
Globulin: 3.2 g/dL (calc) (ref 1.9–3.7)
Glucose, Bld: 83 mg/dL (ref 65–99)
Potassium: 4.4 mmol/L (ref 3.5–5.3)
Sodium: 139 mmol/L (ref 135–146)
Total Bilirubin: 0.3 mg/dL (ref 0.2–1.2)
Total Protein: 7.3 g/dL (ref 6.1–8.1)

## 2018-09-08 LAB — QUANTIFERON-TB GOLD PLUS
Mitogen-NIL: >10 IU/mL
NIL: 0.03 IU/mL
QuantiFERON-TB Gold Plus: NEGATIVE
TB1-NIL: 0.01 IU/mL
TB2-NIL: 0.01 IU/mL

## 2018-09-08 LAB — CBC WITH DIFFERENTIAL/PLATELET
Absolute Monocytes: 616 cells/uL (ref 200–950)
Basophils Absolute: 49 cells/uL (ref 0–200)
Basophils Relative: 0.6 %
Eosinophils Absolute: 162 cells/uL (ref 15–500)
Eosinophils Relative: 2 %
HCT: 37.1 % (ref 35.0–45.0)
Hemoglobin: 12.2 g/dL (ref 11.7–15.5)
Lymphs Abs: 2981 cells/uL (ref 850–3900)
MCH: 29.5 pg (ref 27.0–33.0)
MCHC: 32.9 g/dL (ref 32.0–36.0)
MCV: 89.6 fL (ref 80.0–100.0)
MPV: 9.9 fL (ref 7.5–12.5)
Monocytes Relative: 7.6 %
Neutro Abs: 4293 cells/uL (ref 1500–7800)
Neutrophils Relative %: 53 %
Platelets: 317 10*3/uL (ref 140–400)
RBC: 4.14 10*6/uL (ref 3.80–5.10)
RDW: 13.8 % (ref 11.0–15.0)
Total Lymphocyte: 36.8 %
WBC: 8.1 10*3/uL (ref 3.8–10.8)

## 2018-09-08 NOTE — Telephone Encounter (Signed)
Last visit: 04/21/18 Next visit: 09/21/18 Labs: 09/06/18 WNL TB Gold: 07/29/17 Neg (update 09/06/18 Results pending)  Okay to refill per Dr. Estanislado Pandy

## 2018-09-09 NOTE — Progress Notes (Signed)
TB gold negative

## 2018-09-21 ENCOUNTER — Ambulatory Visit (INDEPENDENT_AMBULATORY_CARE_PROVIDER_SITE_OTHER): Payer: Managed Care, Other (non HMO) | Admitting: Rheumatology

## 2018-09-21 ENCOUNTER — Encounter: Payer: Self-pay | Admitting: Rheumatology

## 2018-09-21 ENCOUNTER — Other Ambulatory Visit: Payer: Self-pay

## 2018-09-21 VITALS — BP 101/60 | HR 82 | Resp 15 | Ht 65.0 in | Wt 241.6 lb

## 2018-09-21 DIAGNOSIS — Z79899 Other long term (current) drug therapy: Secondary | ICD-10-CM | POA: Diagnosis not present

## 2018-09-21 DIAGNOSIS — Z8709 Personal history of other diseases of the respiratory system: Secondary | ICD-10-CM

## 2018-09-21 DIAGNOSIS — Z9889 Other specified postprocedural states: Secondary | ICD-10-CM

## 2018-09-21 DIAGNOSIS — M19072 Primary osteoarthritis, left ankle and foot: Secondary | ICD-10-CM

## 2018-09-21 DIAGNOSIS — M19071 Primary osteoarthritis, right ankle and foot: Secondary | ICD-10-CM

## 2018-09-21 DIAGNOSIS — M170 Bilateral primary osteoarthritis of knee: Secondary | ICD-10-CM | POA: Diagnosis not present

## 2018-09-21 DIAGNOSIS — Z8711 Personal history of peptic ulcer disease: Secondary | ICD-10-CM

## 2018-09-21 DIAGNOSIS — Z8659 Personal history of other mental and behavioral disorders: Secondary | ICD-10-CM

## 2018-09-21 DIAGNOSIS — K5180 Other ulcerative colitis without complications: Secondary | ICD-10-CM | POA: Diagnosis not present

## 2018-09-21 DIAGNOSIS — M461 Sacroiliitis, not elsewhere classified: Secondary | ICD-10-CM

## 2018-09-21 DIAGNOSIS — Z9049 Acquired absence of other specified parts of digestive tract: Secondary | ICD-10-CM

## 2018-09-21 MED ORDER — METHOTREXATE 2.5 MG PO TABS: ORAL_TABLET | ORAL | 0 refills | Status: DC

## 2018-09-21 NOTE — Patient Instructions (Signed)
Standing Labs We placed an order today for your standing lab work.    Please come back and get your standing labs in November and every 3 months   We have open lab daily Monday through Thursday from 8:30-12:30 PM and 1:30-4:30 PM and Friday from 8:30-12:30 PM and 1:30 -4:00 PM at the office of Dr. Bo Merino.   You may experience shorter wait times on Monday and Friday afternoons. The office is located at [address] [place], [place], [place] [address] No appointment is necessary.   Labs are drawn by [facility].  You may receive a bill from [facility] for your lab work.  If you wish to have your labs drawn at another location, please call the office 24 hours in advance to send orders.  If you have any questions regarding directions or hours of operation,  please call [phone].   Just as a reminder please drink plenty of water prior to coming for your lab work. Thanks

## 2018-10-04 ENCOUNTER — Other Ambulatory Visit: Payer: Self-pay | Admitting: Rheumatology

## 2018-10-05 NOTE — Telephone Encounter (Signed)
Last Visit: 09/21/18 Next visit: 02/22/19  Okay to refill per Dr. Estanislado Pandy

## 2018-11-17 ENCOUNTER — Telehealth: Payer: Self-pay | Admitting: Rheumatology

## 2018-11-17 NOTE — Telephone Encounter (Signed)
Patient left a voicemail requesting to speak with you directly.

## 2018-11-18 MED ORDER — FOLIC ACID 1 MG PO TABS: 1.0000 mg | ORAL_TABLET | Freq: Every day | ORAL | 3 refills | Status: AC

## 2018-11-18 NOTE — Telephone Encounter (Signed)
Patient states she needs her prescription for Folic Acid sent to [facility].  Last Visit: 09/21/18 Next visit: 02/22/19  Okay to refill per Dr. Estanislado Pandy

## 2018-11-25 ENCOUNTER — Other Ambulatory Visit: Payer: Self-pay | Admitting: Rheumatology

## 2018-12-15 ENCOUNTER — Telehealth: Payer: Self-pay | Admitting: Rheumatology

## 2018-12-15 ENCOUNTER — Other Ambulatory Visit: Payer: Self-pay

## 2018-12-15 DIAGNOSIS — Z79899 Other long term (current) drug therapy: Secondary | ICD-10-CM

## 2018-12-15 NOTE — Telephone Encounter (Addendum)
Attempted to contact patient and left message on machine to advise patient to contact the office about a 30 day supply to the local pharmacy to ensure patient has medication for Saturday.   Patient declined the 30 day supply to the local pharmacy. She requested the entire prescription be sent to [facility].   Last Visit: 09/21/2018 Next Visit: 02/22/2019 Labs: 12/15/2018 WNL  Okay to refill per Dr. Estanislado Pandy.

## 2018-12-15 NOTE — Telephone Encounter (Signed)
Labs have been released, will call patient about MTX refill.

## 2018-12-15 NOTE — Telephone Encounter (Addendum)
Patient request a refill on MTX sent to [facility]. Patient going to [facility] today for labs. Please release orders. Patient due for dose on Saturday, and completely out. Please call patient to advise.

## 2018-12-16 LAB — CBC WITH DIFFERENTIAL/PLATELET
Absolute Monocytes: 564 cells/uL (ref 200–950)
Basophils Absolute: 50 cells/uL (ref 0–200)
Basophils Relative: 0.5 %
Eosinophils Absolute: 178 cells/uL (ref 15–500)
Eosinophils Relative: 1.8 %
HCT: 37.1 % (ref 35.0–45.0)
Hemoglobin: 12.5 g/dL (ref 11.7–15.5)
Lymphs Abs: 3317 cells/uL (ref 850–3900)
MCH: 30.3 pg (ref 27.0–33.0)
MCHC: 33.7 g/dL (ref 32.0–36.0)
MCV: 89.8 fL (ref 80.0–100.0)
MPV: 10.2 fL (ref 7.5–12.5)
Monocytes Relative: 5.7 %
Neutro Abs: 5792 cells/uL (ref 1500–7800)
Neutrophils Relative %: 58.5 %
Platelets: 344 10*3/uL (ref 140–400)
RBC: 4.13 10*6/uL (ref 3.80–5.10)
RDW: 13.8 % (ref 11.0–15.0)
Total Lymphocyte: 33.5 %
WBC: 9.9 10*3/uL (ref 3.8–10.8)

## 2018-12-16 LAB — COMPLETE METABOLIC PANEL WITH GFR
AG Ratio: 1.2 (calc) (ref 1.0–2.5)
ALT: 28 U/L (ref 6–29)
AST: 22 U/L (ref 10–35)
Albumin: 4.1 g/dL (ref 3.6–5.1)
Alkaline phosphatase (APISO): 105 U/L (ref 37–153)
BUN: 13 mg/dL (ref 7–25)
CO2: 27 mmol/L (ref 20–32)
Calcium: 10 mg/dL (ref 8.6–10.4)
Chloride: 101 mmol/L (ref 98–110)
Creat: 0.74 mg/dL (ref 0.50–1.05)
GFR, Est African American: 106 mL/min/{1.73_m2} (ref >=60)
GFR, Est Non African American: 91 mL/min/{1.73_m2} (ref >=60)
Globulin: 3.5 g/dL (calc) (ref 1.9–3.7)
Glucose, Bld: 93 mg/dL (ref 65–139)
Potassium: 4.1 mmol/L (ref 3.5–5.3)
Sodium: 138 mmol/L (ref 135–146)
Total Bilirubin: 0.3 mg/dL (ref 0.2–1.2)
Total Protein: 7.6 g/dL (ref 6.1–8.1)

## 2018-12-16 MED ORDER — METHOTREXATE 2.5 MG PO TABS: ORAL_TABLET | ORAL | 0 refills | Status: DC

## 2018-12-16 NOTE — Addendum Note (Signed)
Addended by: Earnestine Mealing on: 12/16/2018 08:43 AM   Modules accepted: Orders

## 2019-02-15 NOTE — Progress Notes (Deleted)
Office Visit Note  Patient: Mercedes [name]             Date of Birth: [birth year]           MRN: [id number]             PCP: Libby Maw, MD Referring: Libby Maw,* Visit Date: 02/22/2019 Occupation: @GUAROCC @  Subjective:  No chief complaint on file.   History of Present Illness: Mercedes [name] is a 54 y.o. female ***   Activities of Daily Living:  Patient reports morning stiffness for *** {minute/hour:19697}.   Patient {ACTIONS;DENIES/REPORTS:21021675::"Denies"} nocturnal pain.  Difficulty dressing/grooming: {ACTIONS;DENIES/REPORTS:21021675::"Denies"} Difficulty climbing stairs: {ACTIONS;DENIES/REPORTS:21021675::"Denies"} Difficulty getting out of chair: {ACTIONS;DENIES/REPORTS:21021675::"Denies"} Difficulty using hands for taps, buttons, cutlery, and/or writing: {ACTIONS;DENIES/REPORTS:21021675::"Denies"}  No Rheumatology ROS completed.   PMFS History:  Patient Active Problem List   Diagnosis Date Noted  . 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 History of cholecystectomy 07/30/2016  . Diabetes mellitus ([place]) 01/29/2016  . Impetigo 01/09/2016  . Anniversary reaction 12/20/2015  . Sacroiliitis ([place]) 12/19/2015  . Primary osteoarthritis of both knees 12/19/2015  . Primary osteoarthritis of both feet 12/19/2015  . Depression 12/19/2015  . Peptic ulcer disease 12/19/2015  . Hemorrhoids 12/19/2015  . S/p bilateral carpal tunnel release 12/19/2015  . Dyslipidemia 12/19/2015  . Intermittent asthma without complication 32/54/9826  . Environmental allergies 12/19/2015  . Attention deficit hyperactivity disorder (ADHD) 12/19/2015  . Ulcerative colitis ([place]) 12/18/2015  . High risk medication use 12/18/2015  . Need for hepatitis C screening test 07/12/2015  . Need for prophylactic vaccination against Streptococcus pneumoniae (pneumococcus) 02/07/2015  . Type 2 diabetes mellitus without complication, without long-term current use of insulin ([place])  02/07/2015  . Anemia 06/13/2013  . Injury of hand, right 06/13/2013  . Prediabetes 06/13/2013  . Anxiety state 12/14/2012  . Hyperlipidemia 12/14/2012  . Hypertension, benign 12/14/2012  . Breast mass 07/09/2012  . Herpes zoster 06/19/2012  . Abnormal mammogram 06/09/2012    Past Medical History:  Diagnosis Date  . Arthritis   . Asthma   . High cholesterol   . Hypertension   . Pre-diabetes   . Seasonal allergies     Family History  Problem Relation Age of Onset  . Diabetes Mother   . Hypertension Mother   . Hyperlipidemia Mother   . COPD Father   . Arthritis Father   . Thyroid disease Sister   . Asthma Sister   . ADD / ADHD Son   . Down syndrome Daughter    Past Surgical History:  Procedure Laterality Date  . CARPAL TUNNEL RELEASE  unsure   bilaterally  . CESAREAN SECTION  12/01  . CHOLECYSTECTOMY  12/01  . PARTIAL MASTECTOMY WITH NEEDLE LOCALIZATION Right 07/09/2012   Procedure: RIGHT PARTIAL MASTECTOMY WITH NEEDLE LOCALIZATION;  Surgeon: Adin Hector, MD;  Location: [facility];  Service: General;  Laterality: Right;  . SHOULDER OPEN ROTATOR CUFF REPAIR  2009   rt shoulder  . TOTAL SHOULDER ARTHROPLASTY     Social History   Social History Narrative  . Not on file   Immunization History  Administered Date(s) Administered  . Influenza, Seasonal, Injecte, Preservative Fre 09/14/2010, 10/09/2016  . Influenza,inj,Quad PF,6+ Mos 10/10/2015, 11/10/2017  . Influenza-Unspecified 11/09/2013, 10/14/2014  . Pneumococcal Conjugate-13 02/07/2015  . Pneumococcal Polysaccharide-23 09/06/2013     Objective: Vital Signs: LMP 05/19/2012    Physical Exam   Musculoskeletal Exam: ***  CDAI Exam: CDAI Score: -- Patient Global: --;  Provider Global: -- Swollen: --; Tender: -- Joint Exam 02/22/2019   No joint exam has been documented for this visit   There is currently no information documented on the homunculus. Go to the Rheumatology activity and  complete the homunculus joint exam.  Investigation: No additional findings.  Imaging: No results found.  Recent Labs: Lab Results  Component Value Date   WBC 9.9 12/15/2018   HGB 12.5 12/15/2018   PLT 344 12/15/2018   NA 138 12/15/2018   K 4.1 12/15/2018   CL 101 12/15/2018   CO2 27 12/15/2018   GLUCOSE 93 12/15/2018   BUN 13 12/15/2018   CREATININE 0.74 12/15/2018   BILITOT 0.3 12/15/2018   ALKPHOS 97 07/30/2016   AST 22 12/15/2018   ALT 28 12/15/2018   PROT 7.6 12/15/2018   ALBUMIN 4.1 07/30/2016   CALCIUM 10.0 12/15/2018   GFRAA 106 12/15/2018   QFTBGOLDPLUS NEGATIVE 09/06/2018    Speciality Comments: No specialty comments available.  Procedures:  No procedures performed Allergies: Other, Tape, and Tree extract   Assessment / Plan:     Visit Diagnoses: No diagnosis found.  Orders: No orders of the defined types were placed in this encounter.  No orders of the defined types were placed in this encounter.   Face-to-face time spent with patient was *** minutes. Greater than 50% of time was spent in counseling and coordination of care.  Follow-Up Instructions: No follow-ups on file.   Ofilia Neas, PA-C  Note - This record has been created using Dragon software.  Chart creation errors have been sought, but may not always  have been located. Such creation errors do not reflect on  the standard of medical care.

## 2019-02-22 ENCOUNTER — Ambulatory Visit: Payer: Managed Care, Other (non HMO) | Admitting: Rheumatology

## 2019-02-23 ENCOUNTER — Other Ambulatory Visit: Payer: Self-pay | Admitting: Rheumatology

## 2019-02-23 NOTE — Telephone Encounter (Signed)
Last Visit: 09/21/18 Next visit: 03/09/19  Labs: 12/15/18  TB Gold: 09/06/18 Neg   Okay to refill per Dr. Estanislado Pandy

## 2019-02-25 ENCOUNTER — Telehealth: Payer: Self-pay | Admitting: Rheumatology

## 2019-02-25 MED ORDER — METHOTREXATE 2.5 MG PO TABS: ORAL_TABLET | ORAL | 0 refills | Status: DC

## 2019-02-25 NOTE — Telephone Encounter (Signed)
Advised patient we will refill methotrexate. Patient requested the refill be sent to [facility] delivery pharmacy.

## 2019-02-25 NOTE — Telephone Encounter (Signed)
Last Visit: 09/21/2018 Next Visit: 03/09/2019 Labs: 12/15/2018 CBC and CMP WNL  Okay to refill methotrexate early? Patient is due for refill on 03/16/2019 and her insurance is changing on 03/14/2019.

## 2019-02-25 NOTE — Telephone Encounter (Signed)
Ok to refill MTX

## 2019-02-25 NOTE — Telephone Encounter (Signed)
Patient called stating her insurance is changing on March 14, 2019 from [place] to [place].  Patient is asking if Dr. Estanislado Pandy could authorize another refill for her Methotrexate before her insurance changes.  Patient is requesting a return call to let her know if Dr. Estanislado Pandy approves the request.

## 2019-02-28 DIAGNOSIS — G250 Essential tremor: Secondary | ICD-10-CM | POA: Insufficient documentation

## 2019-03-02 ENCOUNTER — Telehealth: Payer: Self-pay | Admitting: Rheumatology

## 2019-03-02 NOTE — Telephone Encounter (Signed)
Spoke with pharmacist and advised that the two we have documented are the tree extract and the tape allergy.

## 2019-03-02 NOTE — Telephone Encounter (Signed)
[facility] pharmacist at [facility] called stating they received prescription of Methotrexate for the patient.  Lyn[facility] states the following allergies are listed for the patient:  Tree extract, tape and other.  Please call back with detailed information on "other" allergy.  Phone [phone]

## 2019-03-04 NOTE — Progress Notes (Signed)
Office Visit Note  Patient: Mercedes [name]             Date of Birth: 1965-07-10           MRN: [id number]             PCP: Libby Maw, MD Referring: Libby Maw,* Visit Date: 03/09/2019 Occupation: @GUAROCC @  Subjective:  Lower back pain and knee pain.   History of Present Illness: Mercedes [name] is a 54 y.o. female with a past medical history of Ulcerative Colitis and osteoarthritis. She is currently on Humira 40 mg subq injections every 14 days, Methotrexate 4 tablets PO one weekly, and folic acid 1 mg PO daily. She denies any recent flares of Ulcerative Colitis. She states that she is due for a colonoscopy soon. She endorses lower back pain and bilateral hip pain (SI joint). She states that her hip pain will increase with weather changes. She also endorses occasional bilateral hand and knee pain.  There is no history of joint swelling. She states that her knees will "ache" at night and that Tylenol will alleviate the symptoms. She also states that her jaw has started to "lock." She has experienced this in the past and it had subsided, but states that her jaw has recently started to do this again. She denies any other joint pain or joint swelling.   Activities of Daily Living:  Patient reports morning stiffness for 15-20 minutes.   Patient Reports nocturnal pain.  Difficulty dressing/grooming: Denies Difficulty climbing stairs: Reports Difficulty getting out of chair: Denies Difficulty using hands for taps, buttons, cutlery, and/or writing: Reports  Review of Systems  Constitutional: Negative for fatigue.  HENT: Negative for mouth sores, mouth dryness and nose dryness.   Eyes: Negative for itching and dryness.  Respiratory: Negative for shortness of breath and difficulty breathing.   Cardiovascular: Negative for chest pain and palpitations.  Gastrointestinal: Negative for blood in stool, constipation and diarrhea.  Endocrine: Negative for increased urination.    Genitourinary: Negative for difficulty urinating and painful urination.  Musculoskeletal: Positive for arthralgias, joint pain, joint swelling and morning stiffness.  Skin: Negative for rash.  Allergic/Immunologic: Negative for susceptible to infections.  Neurological: Positive for tremors and weakness. Negative for dizziness, headaches and memory loss.  Hematological: Negative for bruising/bleeding tendency.  Psychiatric/Behavioral: Negative for confusion.    PMFS History:  Patient Active Problem List   Diagnosis Date Noted  . 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 History of cholecystectomy 07/30/2016  . Diabetes mellitus ([place]) 01/29/2016  . Impetigo 01/09/2016  . Anniversary reaction 12/20/2015  . Sacroiliitis ([place]) 12/19/2015  . Primary osteoarthritis of both knees 12/19/2015  . Primary osteoarthritis of both feet 12/19/2015  . Depression 12/19/2015  . Peptic ulcer disease 12/19/2015  . Hemorrhoids 12/19/2015  . S/p bilateral carpal tunnel release 12/19/2015  . Dyslipidemia 12/19/2015  . Intermittent asthma without complication 15/17/6160  . Environmental allergies 12/19/2015  . Attention deficit hyperactivity disorder (ADHD) 12/19/2015  . Ulcerative colitis ([place]) 12/18/2015  . High risk medication use 12/18/2015  . Need for hepatitis C screening test 07/12/2015  . Need for prophylactic vaccination against Streptococcus pneumoniae (pneumococcus) 02/07/2015  . Type 2 diabetes mellitus without complication, without long-term current use of insulin ([place]) 02/07/2015  . Anemia 06/13/2013  . Injury of hand, right 06/13/2013  . Prediabetes 06/13/2013  . Anxiety state 12/14/2012  . Hyperlipidemia 12/14/2012  . Hypertension, benign 12/14/2012  . Breast mass 07/09/2012  . Herpes zoster  06/19/2012  . Abnormal mammogram 06/09/2012    Past Medical History:  Diagnosis Date  . Arthritis   . Asthma   . High cholesterol   . Hypertension   . Pre-diabetes   . Seasonal  allergies     Family History  Problem Relation Age of Onset  . Diabetes Mother   . Hypertension Mother   . Hyperlipidemia Mother   . Dementia Mother   . COPD Father   . Arthritis Father   . Thyroid disease Sister   . Thyroid disease Sister   . Cancer Sister        blood cancer  . Asthma Sister   . ADD / ADHD Son   . Down syndrome Daughter    Past Surgical History:  Procedure Laterality Date  . CARPAL TUNNEL RELEASE  unsure   bilaterally  . CESAREAN SECTION  12/01  . CHOLECYSTECTOMY  12/01  . PARTIAL MASTECTOMY WITH NEEDLE LOCALIZATION Right 07/09/2012   Procedure: RIGHT PARTIAL MASTECTOMY WITH NEEDLE LOCALIZATION;  Surgeon: Adin Hector, MD;  Location: [facility];  Service: General;  Laterality: Right;  . SHOULDER OPEN ROTATOR CUFF REPAIR  2009   rt shoulder  . TOTAL SHOULDER ARTHROPLASTY     Social History   Social History Narrative  . Not on file   Immunization History  Administered Date(s) Administered  . Influenza, Seasonal, Injecte, Preservative Fre 09/14/2010, 10/09/2016  . Influenza,inj,Quad PF,6+ Mos 10/10/2015, 11/10/2017  . Influenza-Unspecified 11/09/2013, 10/14/2014  . Pneumococcal Conjugate-13 02/07/2015  . Pneumococcal Polysaccharide-23 09/06/2013     Objective: Vital Signs: BP 114/75 (BP Location: Left Arm, Patient Position: Sitting, Cuff Size: Normal)   Pulse 78   Resp 16   Ht 5' 5"  (1.651 m)   Wt 243 lb (110.2 kg)   LMP 05/19/2012   BMI 40.44 kg/m    Physical Exam Vitals and nursing note reviewed.  Constitutional:      Appearance: She is well-developed.  HENT:     Head: Normocephalic and atraumatic.  Eyes:     Conjunctiva/sclera: Conjunctivae normal.  Cardiovascular:     Rate and Rhythm: Normal rate and regular rhythm.     Heart sounds: Normal heart sounds.  Pulmonary:     Effort: Pulmonary effort is normal.     Breath sounds: Normal breath sounds.  Abdominal:     General: Bowel sounds are normal.     Palpations:  Abdomen is soft.  Musculoskeletal:     Cervical back: Normal range of motion.  Lymphadenopathy:     Cervical: No cervical adenopathy.  Skin:    General: Skin is warm and dry.     Capillary Refill: Capillary refill takes less than 2 seconds.  Neurological:     Mental Status: She is alert and oriented to person, place, and time.  Psychiatric:        Behavior: Behavior normal.      Musculoskeletal Exam: C-spine thoracic and lumbar spine were in good range of motion.  She has dextroscoliosis of lumbar region.  She had mild tenderness over right SI joint.  Shoulder joints, elbow joints, wrist joints with good range of motion.  She has DIP and PIP thickening with no synovitis.  Hip joints, knee joints, ankles, MTPs and PIPs with good range of motion with no synovitis.  She has crepitus with range of motion of bilateral knee joints.  CDAI Exam: CDAI Score: -- Patient Global: --; Provider Global: -- Swollen: --; Tender: -- Joint Exam 03/09/2019  No joint exam has been documented for this visit   There is currently no information documented on the homunculus. Go to the Rheumatology activity and complete the homunculus joint exam.  Investigation: No additional findings.  Imaging: No results found.  Recent Labs: Lab Results  Component Value Date   WBC 9.9 12/15/2018   HGB 12.5 12/15/2018   PLT 344 12/15/2018   NA 138 12/15/2018   K 4.1 12/15/2018   CL 101 12/15/2018   CO2 27 12/15/2018   GLUCOSE 93 12/15/2018   BUN 13 12/15/2018   CREATININE 0.74 12/15/2018   BILITOT 0.3 12/15/2018   ALKPHOS 97 07/30/2016   AST 22 12/15/2018   ALT 28 12/15/2018   PROT 7.6 12/15/2018   ALBUMIN 4.1 07/30/2016   CALCIUM 10.0 12/15/2018   GFRAA 106 12/15/2018   QFTBGOLDPLUS NEGATIVE 09/06/2018    Speciality Comments: No specialty comments available.  Procedures:  No procedures performed Allergies: Other, Tape, and Tree extract   Assessment / Plan:     Visit Diagnoses: Other  ulcerative colitis without complication ([place]) - Patient is followed by Dr. Collene Mares.  Denies any recent flare of UC.  Patient states her colonoscopy is coming up soon.  High risk medication use - Humira 40 mg every 14 days, methotrexate 4 tablets every 7 days, and folic acid 1 mg daily.  We will obtain labs today and then every 3 months to monitor for drug toxicity.  Sacroiliitis ([facility])-she has chronic SI joint discomfort.  She states the SI joint pain flares off and on.  She has had SI joint injections in the past.  Stretching exercises were emphasized.  Primary osteoarthritis of both knees-she complains of some knee joint discomfort.  There is no warmth swelling or effusion in her knee joints.  Primary osteoarthritis of both feet-currently not symptomatic.  S/p bilateral carpal tunnel release-doing well.  History of peptic ulcer disease  History of asthma  History of cholecystectomy  History of attention deficit disorder  Tremors-patient states she was recently seen by Dr. Oren Bracket for tremors.  She was told that she has benign essential tremors.  There is history of tremors in her father.  BMI 40.44-weight loss diet and exercise was discussed at length.  Orders: No orders of the defined types were placed in this encounter.  No orders of the defined types were placed in this encounter.     Follow-Up Instructions: Return in about 5 months (around 08/06/2019) for UC, OA.   Bo Merino, MD  Note - This record has been created using Editor, commissioning.  Chart creation errors have been sought, but may not always  have been located. Such creation errors do not reflect on  the standard of medical care.

## 2019-03-09 ENCOUNTER — Other Ambulatory Visit: Payer: Self-pay

## 2019-03-09 ENCOUNTER — Ambulatory Visit (INDEPENDENT_AMBULATORY_CARE_PROVIDER_SITE_OTHER): Payer: Managed Care, Other (non HMO) | Admitting: Rheumatology

## 2019-03-09 ENCOUNTER — Encounter: Payer: Self-pay | Admitting: Rheumatology

## 2019-03-09 VITALS — BP 114/75 | HR 78 | Resp 16 | Ht 65.0 in | Wt 243.0 lb

## 2019-03-09 DIAGNOSIS — Z8711 Personal history of peptic ulcer disease: Secondary | ICD-10-CM

## 2019-03-09 DIAGNOSIS — Z9889 Other specified postprocedural states: Secondary | ICD-10-CM

## 2019-03-09 DIAGNOSIS — M170 Bilateral primary osteoarthritis of knee: Secondary | ICD-10-CM | POA: Diagnosis not present

## 2019-03-09 DIAGNOSIS — M19072 Primary osteoarthritis, left ankle and foot: Secondary | ICD-10-CM

## 2019-03-09 DIAGNOSIS — K5180 Other ulcerative colitis without complications: Secondary | ICD-10-CM | POA: Diagnosis not present

## 2019-03-09 DIAGNOSIS — G252 Other specified forms of tremor: Secondary | ICD-10-CM

## 2019-03-09 DIAGNOSIS — Z79899 Other long term (current) drug therapy: Secondary | ICD-10-CM | POA: Diagnosis not present

## 2019-03-09 DIAGNOSIS — Z9049 Acquired absence of other specified parts of digestive tract: Secondary | ICD-10-CM

## 2019-03-09 DIAGNOSIS — Z8709 Personal history of other diseases of the respiratory system: Secondary | ICD-10-CM

## 2019-03-09 DIAGNOSIS — Z8659 Personal history of other mental and behavioral disorders: Secondary | ICD-10-CM

## 2019-03-09 DIAGNOSIS — M461 Sacroiliitis, not elsewhere classified: Secondary | ICD-10-CM

## 2019-03-09 DIAGNOSIS — M19071 Primary osteoarthritis, right ankle and foot: Secondary | ICD-10-CM

## 2019-03-09 DIAGNOSIS — Z6841 Body Mass Index (BMI) 40.0 and over, adult: Secondary | ICD-10-CM

## 2019-03-09 LAB — COMPLETE METABOLIC PANEL WITH GFR
AG Ratio: 1.4 (calc) (ref 1.0–2.5)
ALT: 39 U/L — ABNORMAL HIGH (ref 6–29)
AST: 28 U/L (ref 10–35)
Albumin: 4.2 g/dL (ref 3.6–5.1)
Alkaline phosphatase (APISO): 101 U/L (ref 37–153)
BUN: 16 mg/dL (ref 7–25)
CO2: 27 mmol/L (ref 20–32)
Calcium: 9.4 mg/dL (ref 8.6–10.4)
Chloride: 104 mmol/L (ref 98–110)
Creat: 0.93 mg/dL (ref 0.50–1.05)
GFR, Est African American: 80 mL/min/{1.73_m2} (ref >=60)
GFR, Est Non African American: 69 mL/min/{1.73_m2} (ref >=60)
Globulin: 3.1 g/dL (calc) (ref 1.9–3.7)
Glucose, Bld: 100 mg/dL — ABNORMAL HIGH (ref 65–99)
Potassium: 4.4 mmol/L (ref 3.5–5.3)
Sodium: 141 mmol/L (ref 135–146)
Total Bilirubin: 0.3 mg/dL (ref 0.2–1.2)
Total Protein: 7.3 g/dL (ref 6.1–8.1)

## 2019-03-09 LAB — CBC WITH DIFFERENTIAL/PLATELET
Absolute Monocytes: 696 cells/uL (ref 200–950)
Basophils Absolute: 47 cells/uL (ref 0–200)
Basophils Relative: 0.5 %
Eosinophils Absolute: 207 cells/uL (ref 15–500)
Eosinophils Relative: 2.2 %
HCT: 36.6 % (ref 35.0–45.0)
Hemoglobin: 12.2 g/dL (ref 11.7–15.5)
Lymphs Abs: 3177 cells/uL (ref 850–3900)
MCH: 29.7 pg (ref 27.0–33.0)
MCHC: 33.3 g/dL (ref 32.0–36.0)
MCV: 89.1 fL (ref 80.0–100.0)
MPV: 10 fL (ref 7.5–12.5)
Monocytes Relative: 7.4 %
Neutro Abs: 5273 cells/uL (ref 1500–7800)
Neutrophils Relative %: 56.1 %
Platelets: 315 10*3/uL (ref 140–400)
RBC: 4.11 10*6/uL (ref 3.80–5.10)
RDW: 13.6 % (ref 11.0–15.0)
Total Lymphocyte: 33.8 %
WBC: 9.4 10*3/uL (ref 3.8–10.8)

## 2019-03-09 NOTE — Addendum Note (Signed)
Addended by: Carole Binning on: 03/09/2019 03:55 PM   Modules accepted: Orders

## 2019-03-09 NOTE — Patient Instructions (Signed)
Standing Labs We placed an order today for your standing lab work.    Please come back and get your standing labs in May and every 3 months   We have open lab daily Monday through Thursday from 8:30-12:30 PM and 1:30-4:30 PM and Friday from 8:30-12:30 PM and 1:30-4:00 PM at the office of Dr. Melrose Kearse.   You may experience shorter wait times on Monday and Friday afternoons. The office is located at [address], [address], [place], [place] [address] No appointment is necessary.   Labs are drawn by [facility].  You may receive a bill from [facility] for your lab work.  If you wish to have your labs drawn at another location, please call the office 24 hours in advance to send orders.  If you have any questions regarding directions or hours of operation,  please call [phone].   Just as a reminder please drink plenty of water prior to coming for your lab work. Thanks!   

## 2019-03-10 ENCOUNTER — Other Ambulatory Visit: Payer: Self-pay | Admitting: *Deleted

## 2019-03-10 DIAGNOSIS — M2550 Pain in unspecified joint: Secondary | ICD-10-CM

## 2019-03-10 DIAGNOSIS — Z79899 Other long term (current) drug therapy: Secondary | ICD-10-CM

## 2019-03-10 NOTE — Progress Notes (Signed)
LFTs are mildly elevated.  Patient is on methotrexate 4 tablets/week and also statins.  We will continue to monitor labs.  Repeat labs are due in May.

## 2019-03-17 ENCOUNTER — Ambulatory Visit: Payer: Self-pay | Attending: Internal Medicine

## 2019-03-17 DIAGNOSIS — Z23 Encounter for immunization: Secondary | ICD-10-CM | POA: Insufficient documentation

## 2019-03-17 NOTE — Progress Notes (Signed)
   Covid-19 Vaccination Clinic  Name:  Mercedes [name]    MRN: [id number] DOB: 31-Mar-1965  03/17/2019  [name] was observed post Covid-19 immunization for 15 minutes without incident. She was provided with Vaccine Information Sheet and instruction to access the V-Safe system.   [name] was instructed to call 911 with any severe reactions post vaccine: Marland Kitchen Difficulty breathing  . Swelling of face and throat  . A fast heartbeat  . A bad rash all over body  . Dizziness and weakness   Immunizations Administered    Name Date Dose VIS Date Route   Pfizer COVID-19 Vaccine 03/17/2019  2:59 PM 0.3 mL 12/24/2018 Intramuscular   Manufacturer: [place]   Lot: [id number]   [place]: [address]-7

## 2019-04-05 ENCOUNTER — Telehealth: Payer: Self-pay | Admitting: Rheumatology

## 2019-04-05 NOTE — Telephone Encounter (Signed)
Patient called stating her insurance has changed from [place] to [place] and she will download a copy of her card through her Mychart.

## 2019-04-07 ENCOUNTER — Telehealth: Payer: Self-pay | Admitting: Rheumatology

## 2019-04-07 DIAGNOSIS — K5180 Other ulcerative colitis without complications: Secondary | ICD-10-CM

## 2019-04-07 MED ORDER — HUMIRA (2 SYRINGE) 40 MG/0.4ML ~~LOC~~ PSKT: 40.0000 mg | PREFILLED_SYRINGE | SUBCUTANEOUS | 0 refills | Status: DC

## 2019-04-07 NOTE — Telephone Encounter (Signed)
Patient states she received a 90 day supply of MTX. Patient states she only received a 30 day supply of Humira. Patient will call at the beginning of May for refill on MTX. Patient advised will send refill for Humira.   Last Visit: 03/09/19 Next Visit: 08/04/19 Labs: 03/09/19 LFTs are mildly elevated. Patient is on methotrexate 4 tablets/week and also statins. We will continue to monitor labs.  TB Gold: 09/06/18 Neg   Okay to refill per Dr. Estanislado Pandy

## 2019-04-07 NOTE — Telephone Encounter (Signed)
Attempted to contact the patient and left message for patient to call the office. Refills for Humira and MTX were sent 02/23/19 for 90 day supply. Need to verify if patient received 90 day supply pf these medications. Also need to verify mail order pharmacy for refill.

## 2019-04-07 NOTE — Telephone Encounter (Signed)
Patient request refill on MTX, Folic Acid, and Humira sent to new mail order Pharmacy. Pharmacy info under insurance tab on chart along with new insurance information. Patient no linger has [place].

## 2019-04-13 ENCOUNTER — Telehealth: Payer: Self-pay | Admitting: Rheumatology

## 2019-04-13 ENCOUNTER — Ambulatory Visit: Payer: Self-pay | Attending: Internal Medicine

## 2019-04-13 DIAGNOSIS — Z23 Encounter for immunization: Secondary | ICD-10-CM

## 2019-04-13 NOTE — Progress Notes (Signed)
   Covid-19 Vaccination Clinic  Name:  Mercedes [name]    MRN: [id number] DOB: [birth year]  04/13/2019  Ms. Horn was observed post Covid-19 immunization for 15 minutes without incident. She was provided with Vaccine Information Sheet and instruction to access the V-Safe system.   [name] was instructed to call 911 with any severe reactions post vaccine: Marland Kitchen Difficulty breathing  . Swelling of face and throat  . A fast heartbeat  . A bad rash all over body  . Dizziness and weakness   Immunizations Administered    Name Date Dose VIS Date Route   Pfizer COVID-19 Vaccine 04/13/2019  2:54 PM 0.3 mL 12/24/2018 Intramuscular   Manufacturer: [place]   Lot: [id number]   [place]: [address]-0

## 2019-04-13 NOTE — Telephone Encounter (Signed)
Spoke with Colletta Maryland at [facility] and provided patient's date of birth. They have updated patient's chart to the correct date of birth.

## 2019-04-13 NOTE — Telephone Encounter (Signed)
Salomon Fick, pharmacy technician from [facility] left a voicemail requesting a return call to verify patient's date of birth.  Salomon Fick states they have a different date of birth in their system compared to the prescription.  Please call back at [phone]

## 2019-04-19 ENCOUNTER — Telehealth: Payer: Self-pay | Admitting: Rheumatology

## 2019-04-19 DIAGNOSIS — K5180 Other ulcerative colitis without complications: Secondary | ICD-10-CM

## 2019-04-19 MED ORDER — HUMIRA (2 SYRINGE) 40 MG/0.4ML ~~LOC~~ PSKT: 40.0000 mg | PREFILLED_SYRINGE | SUBCUTANEOUS | 0 refills | Status: DC

## 2019-04-19 NOTE — Telephone Encounter (Signed)
Patient provided information for correct specialty pharmacy- [facility] in [place], [place]. Patient requested we send prescription to that location.   Pharmacy phone # [phone]  Pharmacy fax # [phone]   Last Visit: 03/09/2019 Next Visit: 08/04/2019 Labs: 03/09/2019 LFTs are mildly elevated. Patient is on methotrexate 4 tablets/week and also statins. We will continue to monitor labs.  TB Gold: 09/06/2018 negative   Okay to refill per Dr. Estanislado Pandy.

## 2019-04-19 NOTE — Telephone Encounter (Signed)
Patient left a voicemail requesting a return call from the nurse.

## 2019-04-28 ENCOUNTER — Telehealth: Payer: Self-pay | Admitting: Rheumatology

## 2019-04-28 ENCOUNTER — Other Ambulatory Visit: Payer: Self-pay | Admitting: Pharmacist

## 2019-04-28 DIAGNOSIS — K5180 Other ulcerative colitis without complications: Secondary | ICD-10-CM

## 2019-04-28 MED ORDER — HUMIRA (2 PEN) 40 MG/0.4ML ~~LOC~~ AJKT: 40.0000 mg | AUTO-INJECTOR | SUBCUTANEOUS | 0 refills | Status: DC

## 2019-04-28 MED ORDER — HUMIRA (2 SYRINGE) 40 MG/0.4ML ~~LOC~~ PSKT: 40.0000 mg | PREFILLED_SYRINGE | SUBCUTANEOUS | 0 refills | Status: DC

## 2019-04-28 NOTE — Telephone Encounter (Signed)
Received notification from [facility] regarding a prior authorization for [place]. Authorization has been APPROVED from 04/28/2019 to 04/26/2020.   Called patient and advised.

## 2019-04-28 NOTE — Telephone Encounter (Signed)
Patient called stating she just found out her prescription of Humira needs to be sent to [facility].   Patient states if you are able to call this morning with authorization they will be do an urgent process and she will receive her medication on Saturday.  Phone [phone] opt 3

## 2019-04-28 NOTE — Telephone Encounter (Signed)
Letter scanned into Epic with patient's new insurance card says [facility] as her pharmacy

## 2019-04-28 NOTE — Telephone Encounter (Signed)
Last Visit: 03/09/2019 Next Visit: 08/04/2019 Labs: 03/09/2019 LFTs are mildly elevated. Patient is on methotrexate 4 tablets/week and also statins. We will continue to monitor labs.  TB Gold: 09/06/2018 negative   Okay to refill per Dr. Estanislado Pandy.   Patient's preferred pharmacy is [facility]. PA is required.

## 2019-04-28 NOTE — Telephone Encounter (Signed)
Patient called stating "there is another problem regarding her medication" and requesting a return call.

## 2019-04-28 NOTE — Telephone Encounter (Signed)
Spoke to patient, advised her that we sent in her presciption and then it had to be clarified to be filled for the Pens vs the syringe. Called [facility], they advised that they need to verifiy her new insurance information. I let them know that patient has a new active [employer] plan and Humira prior auth- approved through 04/28/19- 04/27/20. Spoke to rep, who now says patient's order is ready for scheduling. Patient can call [phone].  Called patient to update and she will come by the office for a sample tomorrow, if she can not get shipment scheduled.

## 2019-04-28 NOTE — Progress Notes (Signed)
Received clarification request from [facility].  Prescription sent for syringe but patient has history of pen use.  Faxed back with authorization to fill pens.  Updated prescription to pens which patient has been receiving.   Mariella Saa, PharmD, [place], [place] Clinical Specialty Pharmacist [phone]  04/28/2019 9:45 AM

## 2019-04-28 NOTE — Telephone Encounter (Signed)
Ran test claim, patient must fill through [facility]

## 2019-04-28 NOTE — Telephone Encounter (Signed)
Submitted expedited Prior Authorization request to [facility] for HUMIRA via Cover My Meds.  Will update once we receive a response.  Key: Lena, PharmD, [place], C[facility] Clinical Specialty Pharmacist [id number]  04/28/2019 9:28 AM

## 2019-04-29 NOTE — Telephone Encounter (Signed)
Spoke with patient and she was able to get shipment scheduled and medication should arrive tomorrow, 04/30/2019.

## 2019-06-16 ENCOUNTER — Other Ambulatory Visit: Payer: Self-pay | Admitting: *Deleted

## 2019-06-16 DIAGNOSIS — Z79899 Other long term (current) drug therapy: Secondary | ICD-10-CM

## 2019-06-17 ENCOUNTER — Other Ambulatory Visit: Payer: Self-pay | Admitting: Rheumatology

## 2019-06-17 ENCOUNTER — Telehealth: Payer: Self-pay | Admitting: *Deleted

## 2019-06-17 DIAGNOSIS — Z79899 Other long term (current) drug therapy: Secondary | ICD-10-CM

## 2019-06-17 LAB — CBC WITH DIFFERENTIAL/PLATELET
Absolute Monocytes: 1040 cells/uL — ABNORMAL HIGH (ref 200–950)
Basophils Absolute: 48 cells/uL (ref 0–200)
Basophils Relative: 0.3 %
Eosinophils Absolute: 128 cells/uL (ref 15–500)
Eosinophils Relative: 0.8 %
HCT: 38 % (ref 35.0–45.0)
Hemoglobin: 12.5 g/dL (ref 11.7–15.5)
Lymphs Abs: 3200 cells/uL (ref 850–3900)
MCH: 29.6 pg (ref 27.0–33.0)
MCHC: 32.9 g/dL (ref 32.0–36.0)
MCV: 89.8 fL (ref 80.0–100.0)
MPV: 9.9 fL (ref 7.5–12.5)
Monocytes Relative: 6.5 %
Neutro Abs: 11584 cells/uL — ABNORMAL HIGH (ref 1500–7800)
Neutrophils Relative %: 72.4 %
Platelets: 335 10*3/uL (ref 140–400)
RBC: 4.23 10*6/uL (ref 3.80–5.10)
RDW: 14 % (ref 11.0–15.0)
Total Lymphocyte: 20 %
WBC: 16 10*3/uL — ABNORMAL HIGH (ref 3.8–10.8)

## 2019-06-17 LAB — COMPLETE METABOLIC PANEL WITH GFR
AG Ratio: 1.4 (calc) (ref 1.0–2.5)
ALT: 77 U/L — ABNORMAL HIGH (ref 6–29)
AST: 30 U/L (ref 10–35)
Albumin: 4 g/dL (ref 3.6–5.1)
Alkaline phosphatase (APISO): 94 U/L (ref 37–153)
BUN: 18 mg/dL (ref 7–25)
CO2: 27 mmol/L (ref 20–32)
Calcium: 9.6 mg/dL (ref 8.6–10.4)
Chloride: 100 mmol/L (ref 98–110)
Creat: 0.91 mg/dL (ref 0.50–1.05)
GFR, Est African American: 82 mL/min/{1.73_m2} (ref >=60)
GFR, Est Non African American: 71 mL/min/{1.73_m2} (ref >=60)
Globulin: 2.9 g/dL (calc) (ref 1.9–3.7)
Glucose, Bld: 113 mg/dL — ABNORMAL HIGH (ref 65–99)
Potassium: 4.2 mmol/L (ref 3.5–5.3)
Sodium: 139 mmol/L (ref 135–146)
Total Bilirubin: 0.3 mg/dL (ref 0.2–1.2)
Total Protein: 6.9 g/dL (ref 6.1–8.1)

## 2019-06-17 NOTE — Progress Notes (Signed)
I discussed lab results with the patient.  Her LFTs are elevated.  She states she did not take anything except for prednisone and tramadol for neck pain.  I have advised her to come back in 3 weeks to get the repeat CBC and CMP.  Her white cell count is most likely elevated due to prednisone use.  She states her sister was diagnosed with blood cancer and she would like her CBC repeated as well.

## 2019-06-17 NOTE — Telephone Encounter (Signed)
-----   Message from Bo Merino, MD sent at 06/17/2019 12:30 PM EDT ----- I discussed lab results with the patient.  Her LFTs are elevated.  She states she did not take anything except for prednisone and tramadol for neck pain.  I have advised her to come back in 3 weeks to get the repeat CBC and CMP.  Her white cell count  is most likely elevated due to prednisone use.  She states her sister was diagnosed with blood cancer and she would like her CBC repeated as well.

## 2019-06-30 ENCOUNTER — Other Ambulatory Visit: Payer: Self-pay | Admitting: Rheumatology

## 2019-06-30 DIAGNOSIS — K5180 Other ulcerative colitis without complications: Secondary | ICD-10-CM

## 2019-07-19 ENCOUNTER — Telehealth: Payer: Self-pay | Admitting: Rheumatology

## 2019-07-19 MED ORDER — METHOTREXATE 2.5 MG PO TABS: ORAL_TABLET | ORAL | 0 refills | Status: DC

## 2019-07-19 NOTE — Telephone Encounter (Signed)
Patient needs refill on #1. Humira sent to [facility] [phone].  #2. Refill on MTX sent to [facility] [phone].

## 2019-07-19 NOTE — Telephone Encounter (Signed)
Last Visit: 03/09/2019 Next Visit: 08/04/2019  Labs: 06/16/2019 Her LFTs are elevated Her white cell count is most likely elevated due to prednisone use. TB Gold: 09/06/2018 Neg   Current Dose per office note on 03/09/2019: Humira 40 mg every 14 days, methotrexate 4 tablets every 7 days  Okay to refill Humira and MTX?

## 2019-07-20 ENCOUNTER — Telehealth: Payer: Self-pay | Admitting: Rheumatology

## 2019-07-20 DIAGNOSIS — K5180 Other ulcerative colitis without complications: Secondary | ICD-10-CM

## 2019-07-20 MED ORDER — HUMIRA (2 PEN) 40 MG/0.4ML ~~LOC~~ AJKT: 40.0000 mg | AUTO-INJECTOR | SUBCUTANEOUS | 0 refills | Status: DC

## 2019-07-20 NOTE — Telephone Encounter (Signed)
Patient left a voicemail stating "she is having issues getting a prescription refill of her Humira."  Patient requested a return call.

## 2019-07-20 NOTE — Telephone Encounter (Signed)
Last Visit: 03/09/2019 Next Visit: 08/04/2019  Labs: 06/16/2019 Her LFTs are elevated Her white cell count is most likely elevated due to prednisone use. TB Gold: 09/06/2018 Neg   Current Dose per office note on 03/09/2019: Humira 40 mg every 14 days  Okay to refill Humira?

## 2019-07-21 NOTE — Progress Notes (Signed)
Office Visit Note  Patient: Mercedes [name]             Date of Birth: 02/25/1965           MRN: [id number]             PCP: Libby Maw, MD Referring: Libby Maw,* Visit Date: 08/04/2019 Occupation: @GUAROCC @  Subjective:  Pain in both shoulders.   History of Present Illness: Mercedes [name] is a 54 y.o. female with history of ulcerative colitis and inflammatory arthritis.  She is on Humira and methotrexate combination which she has been tolerating well.  She continues to have some discomfort in her SI joints.  She reports discomfort in her bilateral shoulders when she sleeps on her side.  She had right shoulder joint surgery several years ago which causes off-and-on discomfort.  Recently she has been having discomfort in her left shoulder as well.  She has discomfort in her knee joints especially when she is climbing stairs.  She denies any joint swelling.  There is no history of Achilles tendinitis or plantar fasciitis.  She denies any eye inflammation or shortness of breath.  Activities of Daily Living:  Patient reports morning stiffness for 30 minutes.   Patient Reports nocturnal pain.  Difficulty dressing/grooming: Denies Difficulty climbing stairs: Reports Difficulty getting out of chair: Reports Difficulty using hands for taps, buttons, cutlery, and/or writing: Denies  Review of Systems  Constitutional: Positive for fatigue. Negative for night sweats, weight gain and weight loss.  HENT: Negative for mouth sores, trouble swallowing, trouble swallowing, mouth dryness and nose dryness.   Eyes: Negative for pain, redness, visual disturbance and dryness.  Respiratory: Negative for cough, shortness of breath and difficulty breathing.   Cardiovascular: Negative for chest pain, palpitations, hypertension, irregular heartbeat and swelling in legs/feet.  Gastrointestinal: Negative for blood in stool, constipation and diarrhea.  Endocrine: Negative for increased  urination.  Genitourinary: Negative for vaginal dryness.  Musculoskeletal: Positive for arthralgias, joint pain and morning stiffness. Negative for joint swelling, myalgias, muscle weakness, muscle tenderness and myalgias.  Skin: Negative for color change, rash, hair loss, skin tightness, ulcers and sensitivity to sunlight.  Allergic/Immunologic: Negative for susceptible to infections.  Neurological: Negative for dizziness, memory loss, night sweats and weakness.  Hematological: Negative for swollen glands.  Psychiatric/Behavioral: Negative for depressed mood and sleep disturbance. The patient is not nervous/anxious.     PMFS History:  Patient Active Problem List   Diagnosis Date Noted  . 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 History of cholecystectomy 07/30/2016  . Diabetes mellitus ([place]) 01/29/2016  . Impetigo 01/09/2016  . Anniversary reaction 12/20/2015  . Sacroiliitis ([place]) 12/19/2015  . Primary osteoarthritis of both knees 12/19/2015  . Primary osteoarthritis of both feet 12/19/2015  . Depression 12/19/2015  . Peptic ulcer disease 12/19/2015  . Hemorrhoids 12/19/2015  . S/p bilateral carpal tunnel release 12/19/2015  . Dyslipidemia 12/19/2015  . Intermittent asthma without complication 26/83/4196  . Environmental allergies 12/19/2015  . Attention deficit hyperactivity disorder (ADHD) 12/19/2015  . Ulcerative colitis ([place]) 12/18/2015  . High risk medication use 12/18/2015  . Need for hepatitis C screening test 07/12/2015  . Need for prophylactic vaccination against Streptococcus pneumoniae (pneumococcus) 02/07/2015  . Type 2 diabetes mellitus without complication, without long-term current use of insulin ([place]) 02/07/2015  . Anemia 06/13/2013  . Injury of hand, right 06/13/2013  . Prediabetes 06/13/2013  . Anxiety state 12/14/2012  . Hyperlipidemia 12/14/2012  . Hypertension, benign 12/14/2012  .  Breast mass 07/09/2012  . Herpes zoster 06/19/2012  . Abnormal  mammogram 06/09/2012    Past Medical History:  Diagnosis Date  . Arthritis   . Asthma   . High cholesterol   . Hypertension   . Pre-diabetes   . Seasonal allergies     Family History  Problem Relation Age of Onset  . Diabetes Mother   . Hypertension Mother   . Hyperlipidemia Mother   . Dementia Mother   . COPD Father   . Arthritis Father   . Thyroid disease Sister   . Thyroid disease Sister   . Cancer Sister        blood cancer  . Asthma Sister   . ADD / ADHD Son   . Down syndrome Daughter    Past Surgical History:  Procedure Laterality Date  . CARPAL TUNNEL RELEASE  unsure   bilaterally  . CESAREAN SECTION  12/01  . CHOLECYSTECTOMY  12/01  . PARTIAL MASTECTOMY WITH NEEDLE LOCALIZATION Right 07/09/2012   Procedure: RIGHT PARTIAL MASTECTOMY WITH NEEDLE LOCALIZATION;  Surgeon: Adin Hector, MD;  Location: [place] [facility];  Service: General;  Laterality: Right;  . SHOULDER OPEN ROTATOR CUFF REPAIR  2009   rt shoulder  . TOTAL SHOULDER ARTHROPLASTY     Social History   Social History Narrative  . Not on file   Immunization History  Administered Date(s) Administered  . Influenza, Seasonal, Injecte, Preservative Fre 09/14/2010, 10/09/2016  . Influenza,inj,Quad PF,6+ Mos 10/10/2015, 11/10/2017  . Influenza-Unspecified 11/09/2013, 10/14/2014  . PFIZER SARS-COV-2 Vaccination 03/17/2019, 04/13/2019  . Pneumococcal Conjugate-13 02/07/2015  . Pneumococcal Polysaccharide-23 09/06/2013     Objective: Vital Signs: BP 112/76 (BP Location: Left Arm, Patient Position: Sitting, Cuff Size: Normal)   Pulse 82   Resp 17   Ht 5' 5"  (1.651 m)   Wt (!) 240 lb 12.8 oz (109.2 kg)   LMP 05/19/2012   BMI 40.07 kg/m    Physical Exam Vitals and nursing note reviewed.  Constitutional:      Appearance: She is well-developed.  HENT:     Head: Normocephalic and atraumatic.  Eyes:     Conjunctiva/sclera: Conjunctivae normal.  Cardiovascular:     Rate and Rhythm:  Normal rate and regular rhythm.     Heart sounds: Normal heart sounds.  Pulmonary:     Effort: Pulmonary effort is normal.     Breath sounds: Normal breath sounds.  Abdominal:     General: Bowel sounds are normal.     Palpations: Abdomen is soft.  Musculoskeletal:     Cervical back: Normal range of motion.  Lymphadenopathy:     Cervical: No cervical adenopathy.  Skin:    General: Skin is warm and dry.     Capillary Refill: Capillary refill takes less than 2 seconds.  Neurological:     Mental Status: She is alert and oriented to person, place, and time.  Psychiatric:        Behavior: Behavior normal.      Musculoskeletal Exam: C-spine thoracic and lumbar spine with good range of motion.  She has mild tenderness on palpation over SI joints.  Shoulder joints, elbow joints, wrist joints, MCPs PIPs and DIPs with good range of motion with no synovitis.  Hip joints, knee joints, ankles, MTPs and PIPs with good range of motion with no synovitis.  There was no evidence of plantar fasciitis or Achilles tendinitis.  CDAI Exam: CDAI Score: -- Patient Global: --; Provider Global: -- Swollen: --; Tender: --  Joint Exam 08/04/2019   No joint exam has been documented for this visit   There is currently no information documented on the homunculus. Go to the Rheumatology activity and complete the homunculus joint exam.  Investigation: No additional findings.  Imaging: No results found.  Recent Labs: Lab Results  Component Value Date   WBC 16.0 (H) 06/16/2019   HGB 12.5 06/16/2019   PLT 335 06/16/2019   NA 139 06/16/2019   K 4.2 06/16/2019   CL 100 06/16/2019   CO2 27 06/16/2019   GLUCOSE 113 (H) 06/16/2019   BUN 18 06/16/2019   CREATININE 0.91 06/16/2019   BILITOT 0.3 06/16/2019   ALKPHOS 97 07/30/2016   AST 30 06/16/2019   ALT 77 (H) 06/16/2019   PROT 6.9 06/16/2019   ALBUMIN 4.1 07/30/2016   CALCIUM 9.6 06/16/2019   GFRAA 82 06/16/2019   QFTBGOLDPLUS NEGATIVE 09/06/2018     Speciality Comments: No specialty comments available.  Procedures:  No procedures performed Allergies: Other, Tape, and Tree extract   Assessment / Plan:     Visit Diagnoses: Other ulcerative colitis without complication ([place]) -patient is followed by Dr. Collene Mares.  Patient states her GI disease is  controlled.  High risk medication use - Humira 40 mg every 14 days, methotrexate 4 tablets every 7 days, and folic acid 1 mg daily.  Patient is taking Naprosyn prior to last labs which caused elevated LFTs.  She does not want to repeat LFTs today.  She will get labs in September.  We will check TB Gold in September as well.  Sacroiliitis ([facility])-she had mild tenderness over her SI joints.  She does not have discomfort on a regular basis.  Chronic pain of both shoulders - Status post right shoulder surgery by Dr. Amedeo Plenty.  She states her left shoulder joint hurts off and on as she sleeps with her arm under the pillow.  She will try to modify her positioning.  Primary osteoarthritis of both knees-she has some discomfort off and on.  She had no swelling on examination.  Primary osteoarthritis of both feet-proper fitting shoes were discussed.  DDD (degenerative disc disease), cervical - Followed by Dr. Nelva Bush.  She has discomfort in her cervical spine off and on.  She was taking Naprosyn recently because of back pain.  S/p bilateral carpal tunnel release-by Dr. Amedeo Plenty.  Other medical problems are listed as follows:  History of peptic ulcer disease  History of asthma  History of cholecystectomy  History of attention deficit disorder  Coarse tremors  Patient reports that she had Covid vaccine.  She did not delay methotrexate for 1 week after vaccination.  I discussed the risk of inadequate response to vaccination due to immunosuppression.  Use of mask, social distancing and use of hand sanitizer  was emphasized.  Vaccines You are taking a medication(s) that can suppress your immune system.   The following immunizations are recommended: . Flu annually . Covid-19  o Please continue to wear your mask if you are on any medications that suppress your immune system o If you are on methotrexate, Cellcept (mycophenolate), Rinvoq, Morrie Sheldon, and Olumiant - Hold medication for 1 week after each vaccine dose o If you are on Orencia Subcutaneous injections - Hold medication both one week prior to and one week after the first vaccine dose (only) o If you are on Orencia IV infusions - Time vaccine administration so that the first vaccination will occur four weeks after infusion . Pneumonia (Pneumovax 23 and Prevnar 13  spaced at least 1 year apart) . Shingrix (after age 29)  Please check with your PCP to make sure you are up to date.   Orders: No orders of the defined types were placed in this encounter.  No orders of the defined types were placed in this encounter.   Follow-Up Instructions: Return in about 5 months (around 01/04/2020) for UC, inflammatory arthritis.   Bo Merino, MD  Note - This record has been created using Editor, commissioning.  Chart creation errors have been sought, but may not always  have been located. Such creation errors do not reflect on  the standard of medical care.

## 2019-08-04 ENCOUNTER — Other Ambulatory Visit: Payer: Self-pay

## 2019-08-04 ENCOUNTER — Ambulatory Visit (INDEPENDENT_AMBULATORY_CARE_PROVIDER_SITE_OTHER): Payer: BC Managed Care – PPO | Admitting: Rheumatology

## 2019-08-04 ENCOUNTER — Encounter: Payer: Self-pay | Admitting: Rheumatology

## 2019-08-04 VITALS — BP 112/76 | HR 82 | Resp 17 | Ht 65.0 in | Wt 240.8 lb

## 2019-08-04 DIAGNOSIS — G252 Other specified forms of tremor: Secondary | ICD-10-CM

## 2019-08-04 DIAGNOSIS — M461 Sacroiliitis, not elsewhere classified: Secondary | ICD-10-CM

## 2019-08-04 DIAGNOSIS — Z9049 Acquired absence of other specified parts of digestive tract: Secondary | ICD-10-CM

## 2019-08-04 DIAGNOSIS — M25511 Pain in right shoulder: Secondary | ICD-10-CM | POA: Diagnosis not present

## 2019-08-04 DIAGNOSIS — Z8711 Personal history of peptic ulcer disease: Secondary | ICD-10-CM

## 2019-08-04 DIAGNOSIS — M170 Bilateral primary osteoarthritis of knee: Secondary | ICD-10-CM

## 2019-08-04 DIAGNOSIS — M5030 Other cervical disc degeneration, unspecified cervical region: Secondary | ICD-10-CM

## 2019-08-04 DIAGNOSIS — K5180 Other ulcerative colitis without complications: Secondary | ICD-10-CM

## 2019-08-04 DIAGNOSIS — M25512 Pain in left shoulder: Secondary | ICD-10-CM

## 2019-08-04 DIAGNOSIS — M19071 Primary osteoarthritis, right ankle and foot: Secondary | ICD-10-CM

## 2019-08-04 DIAGNOSIS — G8929 Other chronic pain: Secondary | ICD-10-CM

## 2019-08-04 DIAGNOSIS — Z8659 Personal history of other mental and behavioral disorders: Secondary | ICD-10-CM

## 2019-08-04 DIAGNOSIS — Z79899 Other long term (current) drug therapy: Secondary | ICD-10-CM

## 2019-08-04 DIAGNOSIS — Z8709 Personal history of other diseases of the respiratory system: Secondary | ICD-10-CM

## 2019-08-04 DIAGNOSIS — M19072 Primary osteoarthritis, left ankle and foot: Secondary | ICD-10-CM

## 2019-08-04 DIAGNOSIS — Z9889 Other specified postprocedural states: Secondary | ICD-10-CM

## 2019-08-04 NOTE — Patient Instructions (Signed)
Standing Labs We placed an order today for your standing lab work.   Please have your standing labs drawn in September for CBC, CMP and TB Gold and then every 3 months for CBC and CMP.  If possible, please have your labs drawn 2 weeks prior to your appointment so that the provider can discuss your results at your appointment.  We have open lab daily Monday through Thursday from 8:30-12:30 PM and 1:30-4:30 PM and Friday from 8:30-12:30 PM and 1:30-4:00 PM at the office of Dr. Bo Merino, [facility].   Please be advised, patients with office appointments requiring lab work will take precedents over walk-in lab work.  If possible, please come for your lab work on Monday and Friday afternoons, as you may experience shorter wait times. The office is located at [address], [place], [place], [place] [address] No appointment is necessary.   Labs are drawn by [facility]. Please bring your co-pay at the time of your lab draw.  You may receive a bill from [facility] for your lab work.  If you wish to have your labs drawn at another location, please call the office 24 hours in advance to send orders.  If you have any questions regarding directions or hours of operation,  please call [phone].   As a reminder, please drink plenty of water prior to coming for your lab work. Thanks!  Vaccines You are taking a medication(s) that can suppress your immune system.  The following immunizations are recommended: . Flu annually . Covid-19  o Please continue to wear your mask if you are on any medications that suppress your immune system o If you are on methotrexate, Cellcept (mycophenolate), Rinvoq, Morrie Sheldon, and Olumiant - Hold medication for 1 week after each vaccine dose o If you are on Orencia Subcutaneous injections - Hold medication both one week prior to and one week after the first vaccine dose (only) o If you are on Orencia IV infusions - Time vaccine administration so that the  first vaccination will occur four weeks after infusion . Pneumonia (Pneumovax 23 and Prevnar 13 spaced at least 1 year apart) . Shingrix (after age 4)  Please check with your PCP to make sure you are up to date.

## 2019-09-28 ENCOUNTER — Other Ambulatory Visit: Payer: Self-pay | Admitting: Physician Assistant

## 2019-09-28 NOTE — Telephone Encounter (Signed)
Last Visit: 08/04/2019 Next Visit: 01/04/2020 Labs: 06/16/2019 LFTs are elevated. white cell count is most likely elevated due to prednisone use.  Current Dose per office note 07/15/2019: methotrexate 4 tablets every 7 days DX: Other ulcerative colitis without complication Sacroiliitis  Patient advised she is due to update labs   Okay to refill MTX?

## 2019-09-30 ENCOUNTER — Other Ambulatory Visit: Payer: Self-pay

## 2019-09-30 DIAGNOSIS — Z79899 Other long term (current) drug therapy: Secondary | ICD-10-CM

## 2019-10-01 LAB — COMPLETE METABOLIC PANEL WITH GFR
AG Ratio: 1.4 (calc) (ref 1.0–2.5)
ALT: 28 U/L (ref 6–29)
AST: 23 U/L (ref 10–35)
Albumin: 4.1 g/dL (ref 3.6–5.1)
Alkaline phosphatase (APISO): 96 U/L (ref 37–153)
BUN: 13 mg/dL (ref 7–25)
CO2: 26 mmol/L (ref 20–32)
Calcium: 9.6 mg/dL (ref 8.6–10.4)
Chloride: 103 mmol/L (ref 98–110)
Creat: 0.87 mg/dL (ref 0.50–1.05)
GFR, Est African American: 87 mL/min/{1.73_m2} (ref >=60)
GFR, Est Non African American: 75 mL/min/{1.73_m2} (ref >=60)
Globulin: 2.9 g/dL (calc) (ref 1.9–3.7)
Glucose, Bld: 104 mg/dL — ABNORMAL HIGH (ref 65–99)
Potassium: 4 mmol/L (ref 3.5–5.3)
Sodium: 140 mmol/L (ref 135–146)
Total Bilirubin: 0.3 mg/dL (ref 0.2–1.2)
Total Protein: 7 g/dL (ref 6.1–8.1)

## 2019-10-01 LAB — CBC WITH DIFFERENTIAL/PLATELET
Absolute Monocytes: 648 cells/uL (ref 200–950)
Basophils Absolute: 32 cells/uL (ref 0–200)
Basophils Relative: 0.4 %
Eosinophils Absolute: 186 cells/uL (ref 15–500)
Eosinophils Relative: 2.3 %
HCT: 37.1 % (ref 35.0–45.0)
Hemoglobin: 12.3 g/dL (ref 11.7–15.5)
Lymphs Abs: 2730 cells/uL (ref 850–3900)
MCH: 29.9 pg (ref 27.0–33.0)
MCHC: 33.2 g/dL (ref 32.0–36.0)
MCV: 90.3 fL (ref 80.0–100.0)
MPV: 10.2 fL (ref 7.5–12.5)
Monocytes Relative: 8 %
Neutro Abs: 4504 cells/uL (ref 1500–7800)
Neutrophils Relative %: 55.6 %
Platelets: 320 10*3/uL (ref 140–400)
RBC: 4.11 10*6/uL (ref 3.80–5.10)
RDW: 14.1 % (ref 11.0–15.0)
Total Lymphocyte: 33.7 %
WBC: 8.1 10*3/uL (ref 3.8–10.8)

## 2019-10-01 NOTE — Progress Notes (Signed)
CBC and CMP are normal.

## 2019-10-04 ENCOUNTER — Other Ambulatory Visit: Payer: Self-pay | Admitting: *Deleted

## 2019-10-04 DIAGNOSIS — Z111 Encounter for screening for respiratory tuberculosis: Secondary | ICD-10-CM

## 2019-10-04 DIAGNOSIS — K5180 Other ulcerative colitis without complications: Secondary | ICD-10-CM

## 2019-10-04 DIAGNOSIS — Z9225 Personal history of immunosupression therapy: Secondary | ICD-10-CM

## 2019-10-04 MED ORDER — HUMIRA (2 PEN) 40 MG/0.4ML ~~LOC~~ AJKT: 40.0000 mg | AUTO-INJECTOR | SUBCUTANEOUS | 0 refills | Status: DC

## 2019-10-04 NOTE — Telephone Encounter (Signed)
Refill request received via fax  Last Visit: 08/04/2019 Next Visit: 01/04/2020 Labs: 09/30/2019 WNL TB Gold: 09/06/2018 Neg   Current Dose per office note 08/04/2019:  Humira 40 mg every 14 days  DX: Sacroiliitis   Patient advised she is due to update her TB Gold.   Okay to refill per Dr. Estanislado Pandy

## 2019-11-03 ENCOUNTER — Other Ambulatory Visit: Payer: Self-pay | Admitting: Rheumatology

## 2019-11-03 DIAGNOSIS — K5180 Other ulcerative colitis without complications: Secondary | ICD-10-CM

## 2019-11-03 NOTE — Telephone Encounter (Signed)
Last Visit: 08/04/2019 Next Visit: 01/04/2020 Labs: 09/30/2019 CBC and CMP are normal TB Gold: 09/06/2018 Neg   Current Dose per office note 08/04/2019: Humira 40 mg every 14 days  DX: Sacroiliitis   Patient advised she is due to update TB Gold. Patient states she will com early next week to have it updated.   Okay to refill 30 day supply Humira?

## 2019-11-09 ENCOUNTER — Other Ambulatory Visit: Payer: Self-pay | Admitting: *Deleted

## 2019-11-09 DIAGNOSIS — Z9225 Personal history of immunosupression therapy: Secondary | ICD-10-CM

## 2019-11-09 DIAGNOSIS — Z111 Encounter for screening for respiratory tuberculosis: Secondary | ICD-10-CM

## 2019-11-11 LAB — QUANTIFERON-TB GOLD PLUS
Mitogen-NIL: >10 IU/mL
NIL: 0.02 IU/mL
QuantiFERON-TB Gold Plus: NEGATIVE
TB1-NIL: 0.01 IU/mL
TB2-NIL: 0.01 IU/mL

## 2019-11-11 NOTE — Progress Notes (Signed)
TB Gold negative

## 2019-11-12 ENCOUNTER — Ambulatory Visit: Payer: BC Managed Care – PPO | Attending: Internal Medicine

## 2019-11-12 DIAGNOSIS — Z23 Encounter for immunization: Secondary | ICD-10-CM

## 2019-11-12 NOTE — Progress Notes (Signed)
   Covid-19 Vaccination Clinic  Name:  Mercedes [name]    MRN: [id number] DOB: Aug 16, 1965  11/12/2019  [name] was observed post Covid-19 immunization for 15 minutes without incident. She was provided with Vaccine Information Sheet and instruction to access the V-Safe system.   Ms. Ground was instructed to call 911 with any severe reactions post vaccine: Marland Kitchen Difficulty breathing  . Swelling of face and throat  . A fast heartbeat  . A bad rash all over body  . Dizziness and weakness

## 2019-11-30 ENCOUNTER — Other Ambulatory Visit: Payer: Self-pay | Admitting: Rheumatology

## 2019-11-30 DIAGNOSIS — K5180 Other ulcerative colitis without complications: Secondary | ICD-10-CM

## 2019-11-30 NOTE — Telephone Encounter (Signed)
Last Visit:08/04/2019 Next Visit:01/04/2020 Labs: 09/30/2019 CBC and CMP are normal TB Gold:11/09/2019 Neg   Current Dose per office note7/22/2021:Humira 40 mg every 14 days  ZU:[id number]  Okay to refill per Dr. Estanislado Pandy

## 2019-12-21 ENCOUNTER — Other Ambulatory Visit: Payer: Self-pay | Admitting: Rheumatology

## 2019-12-21 NOTE — Progress Notes (Signed)
Office Visit Note  Patient: Mercedes [name]             Date of Birth: April 08, 1965           MRN: [id number]             PCP: Libby Maw, MD Referring: Libby Maw,* Visit Date: 01/04/2020 Occupation: @GUAROCC @  Subjective:  Knee pain   History of Present Illness: Mercedes [name] is a 54 y.o. female with inflammatory arthritis and ulcerative colitis.  She complains of having intermittent pain and swelling in her left knee joint for the last 3 months.  Recently she has been having increased pain and discomfort in her left knee joint.  She is having difficulty bending it and it causes discomfort in the back of her knee.  She has some discomfort in her right knee joint.  None of the other joints are painful or swollen.  She continues to have discomfort in her bilateral SI joints.  Not had any flares of ulcerative colitis.  Scheduled to have a colonoscopy on January 30, 2020.  Activities of Daily Living:  Patient reports morning stiffness for 15-20 minutes.   Patient Reports nocturnal pain.  Difficulty dressing/grooming: Denies Difficulty climbing stairs: Reports Difficulty getting out of chair: Denies Difficulty using hands for taps, buttons, cutlery, and/or writing: Denies  Review of Systems  Constitutional: Negative for fatigue.  HENT: Negative for mouth sores, mouth dryness and nose dryness.   Eyes: Negative for pain, itching and dryness.  Respiratory: Negative for shortness of breath and difficulty breathing.   Cardiovascular: Negative for chest pain and palpitations.  Gastrointestinal: Positive for blood in stool. Negative for constipation and diarrhea.  Endocrine: Negative for increased urination.  Genitourinary: Negative for difficulty urinating.  Musculoskeletal: Positive for arthralgias, joint pain, joint swelling, myalgias, morning stiffness, muscle tenderness and myalgias.  Skin: Negative for color change, rash and redness.  Allergic/Immunologic: Negative  for susceptible to infections.  Neurological: Negative for dizziness, numbness, headaches, memory loss and weakness.  Hematological: Negative for bruising/bleeding tendency.  Psychiatric/Behavioral: Negative for confusion.    PMFS History:  Patient Active Problem List   Diagnosis Date Noted  . 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 History of cholecystectomy 07/30/2016  . Diabetes mellitus ([place]) 01/29/2016  . Impetigo 01/09/2016  . Anniversary reaction 12/20/2015  . Sacroiliitis ([place]) 12/19/2015  . Primary osteoarthritis of both knees 12/19/2015  . Primary osteoarthritis of both feet 12/19/2015  . Depression 12/19/2015  . Peptic ulcer disease 12/19/2015  . Hemorrhoids 12/19/2015  . S/p bilateral carpal tunnel release 12/19/2015  . Dyslipidemia 12/19/2015  . Intermittent asthma without complication 66/44/0347  . Environmental allergies 12/19/2015  . Attention deficit hyperactivity disorder (ADHD) 12/19/2015  . Ulcerative colitis ([place]) 12/18/2015  . High risk medication use 12/18/2015  . Need for hepatitis C screening test 07/12/2015  . Need for prophylactic vaccination against Streptococcus pneumoniae (pneumococcus) 02/07/2015  . Type 2 diabetes mellitus without complication, without long-term current use of insulin ([place]) 02/07/2015  . Anemia 06/13/2013  . Injury of hand, right 06/13/2013  . Prediabetes 06/13/2013  . Anxiety state 12/14/2012  . Hyperlipidemia 12/14/2012  . Hypertension, benign 12/14/2012  . Breast mass 07/09/2012  . Herpes zoster 06/19/2012  . Abnormal mammogram 06/09/2012    Past Medical History:  Diagnosis Date  . Arthritis   . Asthma   . High cholesterol   . Hypertension   . Pre-diabetes   . Seasonal allergies     Family  History  Problem Relation Age of Onset  . Diabetes Mother   . Hypertension Mother   . Hyperlipidemia Mother   . Dementia Mother   . COPD Father   . Arthritis Father   . Thyroid disease Sister   . Thyroid disease Sister    . Cancer Sister        blood cancer  . Asthma Sister   . ADD / ADHD Son   . Down syndrome Daughter    Past Surgical History:  Procedure Laterality Date  . CARPAL TUNNEL RELEASE  unsure   bilaterally  . CESAREAN SECTION  12/01  . CHOLECYSTECTOMY  12/01  . PARTIAL MASTECTOMY WITH NEEDLE LOCALIZATION Right 07/09/2012   Procedure: RIGHT PARTIAL MASTECTOMY WITH NEEDLE LOCALIZATION;  Surgeon: Adin Hector, MD;  Location: [facility];  Service: General;  Laterality: Right;  . SHOULDER OPEN ROTATOR CUFF REPAIR  2009   rt shoulder  . TOTAL SHOULDER ARTHROPLASTY     Social History   Social History Narrative  . Not on file   Immunization History  Administered Date(s) Administered  . Influenza, Seasonal, Injecte, Preservative Fre 09/14/2010, 10/09/2016  . Influenza,inj,Quad PF,6+ Mos 10/10/2015, 11/10/2017  . Influenza-Unspecified 11/09/2013, 10/14/2014  . PFIZER SARS-COV-2 Vaccination 03/17/2019, 04/13/2019, 11/12/2019  . Pneumococcal Conjugate-13 02/07/2015  . Pneumococcal Polysaccharide-23 09/06/2013     Objective: Vital Signs: BP 124/83 (BP Location: Left Arm, Patient Position: Sitting, Cuff Size: Normal)   Pulse 94   Resp 17   Ht 5' 5"  (1.651 m)   Wt 238 lb (108 kg)   LMP 05/19/2012   BMI 39.61 kg/m    Physical Exam Vitals and nursing note reviewed.  Constitutional:      Appearance: She is well-developed and well-nourished.  HENT:     Head: Normocephalic and atraumatic.  Eyes:     Extraocular Movements: EOM normal.     Conjunctiva/sclera: Conjunctivae normal.  Cardiovascular:     Rate and Rhythm: Normal rate and regular rhythm.     Pulses: Intact distal pulses.     Heart sounds: Normal heart sounds.  Pulmonary:     Effort: Pulmonary effort is normal.     Breath sounds: Normal breath sounds.  Abdominal:     General: Bowel sounds are normal.     Palpations: Abdomen is soft.  Musculoskeletal:     Cervical back: Normal range of motion.   Lymphadenopathy:     Cervical: No cervical adenopathy.  Skin:    General: Skin is warm and dry.     Capillary Refill: Capillary refill takes less than 2 seconds.  Neurological:     Mental Status: She is alert and oriented to person, place, and time.  Psychiatric:        Mood and Affect: Mood and affect normal.        Behavior: Behavior normal.      Musculoskeletal Exam: C-spine thoracic and lumbar spine with good range of motion.  She has mild tenderness over SI joints.  Shoulder joints, elbow joints, wrist joints with good range of motion.  She has bilateral PIP and DIP thickening with no synovitis.  Hip joints and knee joints with good range of motion.  She had some warmth on palpation of her left knee joint.  Ankles and MTPs with good range of motion with no synovitis.  CDAI Exam: CDAI Score: -- Patient Global: --; Provider Global: -- Swollen: --; Tender: -- Joint Exam 01/04/2020   No joint exam has been documented for  this visit   There is currently no information documented on the homunculus. Go to the Rheumatology activity and complete the homunculus joint exam.  Investigation: No additional findings.  Imaging: XR KNEE 3 VIEW LEFT  Result Date: 01/04/2020 Mild medial compartment narrowing with medial and intercondylar osteophytes were noted.  Severe patellofemoral narrowing was noted.  No chondrocalcinosis was noted. Impression: These findings are consistent with mild osteoarthritis and severe chondromalacia patella.  XR KNEE 3 VIEW RIGHT  Result Date: 01/04/2020 Mild medial compartment narrowing with medial osteophytes was noted.  No chondrocalcinosis was noted.  Moderate patellofemoral narrowing was noted. Impression: These findings are consistent with mild osteoarthritis and moderate chondromalacia patella.   Recent Labs: Lab Results  Component Value Date   WBC 8.1 09/30/2019   HGB 12.3 09/30/2019   PLT 320 09/30/2019   NA 140 09/30/2019   K 4.0 09/30/2019    CL 103 09/30/2019   CO2 26 09/30/2019   GLUCOSE 104 (H) 09/30/2019   BUN 13 09/30/2019   CREATININE 0.87 09/30/2019   BILITOT 0.3 09/30/2019   ALKPHOS 97 07/30/2016   AST 23 09/30/2019   ALT 28 09/30/2019   PROT 7.0 09/30/2019   ALBUMIN 4.1 07/30/2016   CALCIUM 9.6 09/30/2019   GFRAA 87 09/30/2019   QFTBGOLDPLUS NEGATIVE 11/09/2019    Speciality Comments: No specialty comments available.  Procedures:  No procedures performed Allergies: Other, Tape, and Tree extract   Assessment / Plan:     Visit Diagnoses: Other ulcerative colitis without complication ([place]) - patient is followed by Dr. Collene Mares.  Patient states going to have a colonoscopy in mid January.  She denies having any GI symptoms currently.  High risk medication use - Humira 40 mg every 14 days, methotrexate 4 tablets every 7 days, and folic acid 1 mg daily.  Sacroiliitis ([facility])-SI joint pain persists.  Chronic pain of both shoulders - Status post right shoulder surgery by Dr. Amedeo Plenty.  Chronic pain of left knee -she complains of pain and intermittent swelling in her left knee joint.  She had mild warmth but no swelling or effusion.  X-ray findings were consistent with mild osteoarthritis and chondromalacia patella.  Handout on knee joint exercises was given.  Weight loss diet and exercise was discussed.  Plan: XR KNEE 3 VIEW LEFT.  If she has ongoing the symptoms we will increase the dose of methotrexate.  I do not see the need to increase the dose at this point.  Chronic pain of right knee - Plan: XR KNEE 3 VIEW RIGHT.  She has been having some discomfort in her right knee joint.  X-ray findings were discussed which are consistent with mild osteoarthritis and chondromalacia patella.  Weight loss diet and exercise was discussed.  Primary osteoarthritis of both knees - Plan: XR KNEE 3 VIEW RIGHT, XR KNEE 3 VIEW LEFT  Primary osteoarthritis of both feet-joint protection was discussed.  DDD (degenerative disc disease),  cervical - Followed by Dr. Nelva Bush  S/p bilateral carpal tunnel release - by Dr. Amedeo Plenty.  History of peptic ulcer disease  History of cholecystectomy  History of attention deficit disorder  History of asthma  Coarse tremors  BMI 39.61-weight loss diet and exercise was discussed.  Have also given her a handout on weight management clinic.  Orders: Orders Placed This Encounter  Procedures  . XR KNEE 3 VIEW RIGHT  . XR KNEE 3 VIEW LEFT   No orders of the defined types were placed in this encounter.   Follow-Up Instructions:  Return for UC, inflammatory arthritis.   Bo Merino, MD  Note - This record has been created using Editor, commissioning.  Chart creation errors have been sought, but may not always  have been located. Such creation errors do not reflect on  the standard of medical care.

## 2019-12-21 NOTE — Telephone Encounter (Signed)
Last Visit:08/04/2019 Next Visit:01/04/2020 Labs: 09/30/2019 CBC and CMP are normal  Current Dose per office note 07/15/2019: methotrexate 4 tablets every 7 days DX: Other ulcerative colitis without complication Sacroiliitis  Okay to refill per Dr. Estanislado Pandy

## 2020-01-04 ENCOUNTER — Ambulatory Visit (INDEPENDENT_AMBULATORY_CARE_PROVIDER_SITE_OTHER): Payer: BC Managed Care – PPO | Admitting: Rheumatology

## 2020-01-04 ENCOUNTER — Ambulatory Visit: Payer: Self-pay

## 2020-01-04 ENCOUNTER — Other Ambulatory Visit: Payer: Self-pay

## 2020-01-04 ENCOUNTER — Encounter: Payer: Self-pay | Admitting: Rheumatology

## 2020-01-04 VITALS — BP 124/83 | HR 94 | Resp 17 | Ht 65.0 in | Wt 238.0 lb

## 2020-01-04 DIAGNOSIS — Z79899 Other long term (current) drug therapy: Secondary | ICD-10-CM | POA: Diagnosis not present

## 2020-01-04 DIAGNOSIS — M25561 Pain in right knee: Secondary | ICD-10-CM

## 2020-01-04 DIAGNOSIS — M25562 Pain in left knee: Secondary | ICD-10-CM

## 2020-01-04 DIAGNOSIS — M461 Sacroiliitis, not elsewhere classified: Secondary | ICD-10-CM

## 2020-01-04 DIAGNOSIS — K5180 Other ulcerative colitis without complications: Secondary | ICD-10-CM

## 2020-01-04 DIAGNOSIS — M25512 Pain in left shoulder: Secondary | ICD-10-CM

## 2020-01-04 DIAGNOSIS — G8929 Other chronic pain: Secondary | ICD-10-CM | POA: Diagnosis not present

## 2020-01-04 DIAGNOSIS — M25511 Pain in right shoulder: Secondary | ICD-10-CM | POA: Diagnosis not present

## 2020-01-04 DIAGNOSIS — M19072 Primary osteoarthritis, left ankle and foot: Secondary | ICD-10-CM

## 2020-01-04 DIAGNOSIS — M19071 Primary osteoarthritis, right ankle and foot: Secondary | ICD-10-CM

## 2020-01-04 DIAGNOSIS — Z8709 Personal history of other diseases of the respiratory system: Secondary | ICD-10-CM

## 2020-01-04 DIAGNOSIS — Z9889 Other specified postprocedural states: Secondary | ICD-10-CM

## 2020-01-04 DIAGNOSIS — M5030 Other cervical disc degeneration, unspecified cervical region: Secondary | ICD-10-CM

## 2020-01-04 DIAGNOSIS — Z8659 Personal history of other mental and behavioral disorders: Secondary | ICD-10-CM

## 2020-01-04 DIAGNOSIS — Z9049 Acquired absence of other specified parts of digestive tract: Secondary | ICD-10-CM

## 2020-01-04 DIAGNOSIS — G252 Other specified forms of tremor: Secondary | ICD-10-CM

## 2020-01-04 DIAGNOSIS — Z8711 Personal history of peptic ulcer disease: Secondary | ICD-10-CM

## 2020-01-04 DIAGNOSIS — M170 Bilateral primary osteoarthritis of knee: Secondary | ICD-10-CM

## 2020-01-04 NOTE — Patient Instructions (Addendum)
Standing Labs We placed an order today for your standing lab work.   Please have your standing labs drawn in march and every 3 months  If possible, please have your labs drawn 2 weeks prior to your appointment so that the provider can discuss your results at your appointment.  We have open lab daily Monday through Thursday from 8:30-12:30 PM and 1:30-4:30 PM and Friday from 8:30-12:30 PM and 1:30-4:00 PM at the office of Dr. Bo Merino, [facility].   Please be advised, patients with office appointments requiring lab work will take precedents over walk-in lab work.  If possible, please come for your lab work on Monday and Friday afternoons, as you may experience shorter wait times. The office is located at [address], [place], [place], [place] [address] No appointment is necessary.   Labs are drawn by [facility]. Please bring your co-pay at the time of your lab draw.  You may receive a bill from [facility] for your lab work.  If you wish to have your labs drawn at another location, please call the office 24 hours in advance to send orders.  If you have any questions regarding directions or hours of operation,  please call [phone].   As a reminder, please drink plenty of water prior to coming for your lab work. Thanks!  Journal for Nurse Practitioners, 15(4), [phone]. Retrieved October 19, 2017 from http://clinicalkey.com/nursing">  Knee Exercises Ask your health care provider which exercises are safe for you. Do exercises exactly as told by your health care provider and adjust them as directed. It is normal to feel mild stretching, pulling, tightness, or discomfort as you do these exercises. Stop right away if you feel sudden pain or your pain gets worse. Do not begin these exercises until told by your health care provider. Stretching and range-of-motion exercises These exercises warm up your muscles and joints and improve the movement and flexibility of your knee. These  exercises also help to relieve pain and swelling. Knee extension, prone 1. Lie on your abdomen (prone position) on a bed. 2. Place your left / right knee just beyond the edge of the surface so your knee is not on the bed. You can put a towel under your left / right thigh just above your kneecap for comfort. 3. Relax your leg muscles and allow gravity to straighten your knee (extension). You should feel a stretch behind your left / right knee. 4. Hold this position for __________ seconds. 5. Scoot up so your knee is supported between repetitions. Repeat __________ times. Complete this exercise __________ times a day. Knee flexion, active  1. Lie on your back with both legs straight. If this causes back discomfort, bend your left / right knee so your foot is flat on the floor. 2. Slowly slide your left / right heel back toward your buttocks. Stop when you feel a gentle stretch in the front of your knee or thigh (flexion). 3. Hold this position for __________ seconds. 4. Slowly slide your left / right heel back to the starting position. Repeat __________ times. Complete this exercise __________ times a day. Quadriceps stretch, prone  1. Lie on your abdomen on a firm surface, such as a bed or padded floor. 2. Bend your left / right knee and hold your ankle. If you cannot reach your ankle or pant leg, loop a belt around your foot and grab the belt instead. 3. Gently pull your heel toward your buttocks. Your knee should not slide out  to the side. You should feel a stretch in the front of your thigh and knee (quadriceps). 4. Hold this position for __________ seconds. Repeat __________ times. Complete this exercise __________ times a day. Hamstring, supine 1. Lie on your back (supine position). 2. Loop a belt or towel over the ball of your left / right foot. The ball of your foot is on the walking surface, right under your toes. 3. Straighten your left / right knee and slowly pull on the belt to  raise your leg until you feel a gentle stretch behind your knee (hamstring). ? Do not let your knee bend while you do this. ? Keep your other leg flat on the floor. 4. Hold this position for __________ seconds. Repeat __________ times. Complete this exercise __________ times a day. Strengthening exercises These exercises build strength and endurance in your knee. Endurance is the ability to use your muscles for a long time, even after they get tired. Quadriceps, isometric This exercise stretches the muscles in front of your thigh (quadriceps) without moving your knee joint (isometric). 1. Lie on your back with your left / right leg extended and your other knee bent. Put a rolled towel or small pillow under your knee if told by your health care provider. 2. Slowly tense the muscles in the front of your left / right thigh. You should see your kneecap slide up toward your hip or see increased dimpling just above the knee. This motion will push the back of the knee toward the floor. 3. For __________ seconds, hold the muscle as tight as you can without increasing your pain. 4. Relax the muscles slowly and completely. Repeat __________ times. Complete this exercise __________ times a day. Straight leg raises This exercise stretches the muscles in front of your thigh (quadriceps) and the muscles that move your hips (hip flexors). 1. Lie on your back with your left / right leg extended and your other knee bent. 2. Tense the muscles in the front of your left / right thigh. You should see your kneecap slide up or see increased dimpling just above the knee. Your thigh may even shake a bit. 3. Keep these muscles tight as you raise your leg 4-6 inches (10-15 cm) off the floor. Do not let your knee bend. 4. Hold this position for __________ seconds. 5. Keep these muscles tense as you lower your leg. 6. Relax your muscles slowly and completely after each repetition. Repeat __________ times. Complete this  exercise __________ times a day. Hamstring, isometric 1. Lie on your back on a firm surface. 2. Bend your left / right knee about __________ degrees. 3. Dig your left / right heel into the surface as if you are trying to pull it toward your buttocks. Tighten the muscles in the back of your thighs (hamstring) to "dig" as hard as you can without increasing any pain. 4. Hold this position for __________ seconds. 5. Release the tension gradually and allow your muscles to relax completely for __________ seconds after each repetition. Repeat __________ times. Complete this exercise __________ times a day. Hamstring curls If told by your health care provider, do this exercise while wearing ankle weights. Begin with __________ lb weights. Then increase the weight by 1 lb (0.5 kg) increments. Do not wear ankle weights that are more than __________ lb. 1. Lie on your abdomen with your legs straight. 2. Bend your left / right knee as far as you can without feeling pain. Keep your hips flat against  the floor. 3. Hold this position for __________ seconds. 4. Slowly lower your leg to the starting position. Repeat __________ times. Complete this exercise __________ times a day. Squats This exercise strengthens the muscles in front of your thigh and knee (quadriceps). 1. Stand in front of a table, with your feet and knees pointing straight ahead. You may rest your hands on the table for balance but not for support. 2. Slowly bend your knees and lower your hips like you are going to sit in a chair. ? Keep your weight over your heels, not over your toes. ? Keep your lower legs upright so they are parallel with the table legs. ? Do not let your hips go lower than your knees. ? Do not bend lower than told by your health care provider. ? If your knee pain increases, do not bend as low. 3. Hold the squat position for __________ seconds. 4. Slowly push with your legs to return to standing. Do not use your hands to  pull yourself to standing. Repeat __________ times. Complete this exercise __________ times a day. Wall slides This exercise strengthens the muscles in front of your thigh and knee (quadriceps). 1. Lean your back against a smooth wall or door, and walk your feet out 18-24 inches (46-61 cm) from it. 2. Place your feet hip-width apart. 3. Slowly slide down the wall or door until your knees bend __________ degrees. Keep your knees over your heels, not over your toes. Keep your knees in line with your hips. 4. Hold this position for __________ seconds. Repeat __________ times. Complete this exercise __________ times a day. Straight leg raises This exercise strengthens the muscles that rotate the leg at the hip and move it away from your body (hip abductors). 1. Lie on your side with your left / right leg in the top position. Lie so your head, shoulder, knee, and hip line up. You may bend your bottom knee to help you keep your balance. 2. Roll your hips slightly forward so your hips are stacked directly over each other and your left / right knee is facing forward. 3. Leading with your heel, lift your top leg 4-6 inches (10-15 cm). You should feel the muscles in your outer hip lifting. ? Do not let your foot drift forward. ? Do not let your knee roll toward the ceiling. 4. Hold this position for __________ seconds. 5. Slowly return your leg to the starting position. 6. Let your muscles relax completely after each repetition. Repeat __________ times. Complete this exercise __________ times a day. Straight leg raises This exercise stretches the muscles that move your hips away from the front of the pelvis (hip extensors). 1. Lie on your abdomen on a firm surface. You can put a pillow under your hips if that is more comfortable. 2. Tense the muscles in your buttocks and lift your left / right leg about 4-6 inches (10-15 cm). Keep your knee straight as you lift your leg. 3. Hold this position for  __________ seconds. 4. Slowly lower your leg to the starting position. 5. Let your leg relax completely after each repetition. Repeat __________ times. Complete this exercise __________ times a day. This information is not intended to replace advice given to you by your health care provider. Make sure you discuss any questions you have with your health care provider. Document Revised: 10/20/2017 Document Reviewed: 10/20/2017 Elsevier Patient Education  2020 [employer].

## 2020-01-05 LAB — CBC WITH DIFFERENTIAL/PLATELET
Absolute Monocytes: 685 cells/uL (ref 200–950)
Basophils Absolute: 43 cells/uL (ref 0–200)
Basophils Relative: 0.4 %
Eosinophils Absolute: 203 cells/uL (ref 15–500)
Eosinophils Relative: 1.9 %
HCT: 38.4 % (ref 35.0–45.0)
Hemoglobin: 12.8 g/dL (ref 11.7–15.5)
Lymphs Abs: 3371 cells/uL (ref 850–3900)
MCH: 29.6 pg (ref 27.0–33.0)
MCHC: 33.3 g/dL (ref 32.0–36.0)
MCV: 88.7 fL (ref 80.0–100.0)
MPV: 9.9 fL (ref 7.5–12.5)
Monocytes Relative: 6.4 %
Neutro Abs: 6399 cells/uL (ref 1500–7800)
Neutrophils Relative %: 59.8 %
Platelets: 348 10*3/uL (ref 140–400)
RBC: 4.33 10*6/uL (ref 3.80–5.10)
RDW: 13.3 % (ref 11.0–15.0)
Total Lymphocyte: 31.5 %
WBC: 10.7 10*3/uL (ref 3.8–10.8)

## 2020-01-05 LAB — COMPLETE METABOLIC PANEL WITH GFR
AG Ratio: 1.5 (calc) (ref 1.0–2.5)
ALT: 38 U/L — ABNORMAL HIGH (ref 6–29)
AST: 29 U/L (ref 10–35)
Albumin: 4.6 g/dL (ref 3.6–5.1)
Alkaline phosphatase (APISO): 103 U/L (ref 37–153)
BUN: 17 mg/dL (ref 7–25)
CO2: 27 mmol/L (ref 20–32)
Calcium: 10.3 mg/dL (ref 8.6–10.4)
Chloride: 101 mmol/L (ref 98–110)
Creat: 0.87 mg/dL (ref 0.50–1.05)
GFR, Est African American: 86 mL/min/{1.73_m2} (ref >=60)
GFR, Est Non African American: 74 mL/min/{1.73_m2} (ref >=60)
Globulin: 3.1 g/dL (calc) (ref 1.9–3.7)
Glucose, Bld: 110 mg/dL — ABNORMAL HIGH (ref 65–99)
Potassium: 4.2 mmol/L (ref 3.5–5.3)
Sodium: 139 mmol/L (ref 135–146)
Total Bilirubin: 0.3 mg/dL (ref 0.2–1.2)
Total Protein: 7.7 g/dL (ref 6.1–8.1)

## 2020-01-05 NOTE — Progress Notes (Signed)
CBC is normal.  Glucose is mildly elevated most likely not a fasting sample.  ALT is mildly elevated.  Patient is on methotrexate and statins.  Continue to monitor labs.

## 2020-02-21 ENCOUNTER — Other Ambulatory Visit: Payer: Self-pay | Admitting: Obstetrics and Gynecology

## 2020-02-21 DIAGNOSIS — N644 Mastodynia: Secondary | ICD-10-CM

## 2020-02-24 ENCOUNTER — Other Ambulatory Visit: Payer: Self-pay

## 2020-02-24 ENCOUNTER — Ambulatory Visit
Admission: RE | Admit: 2020-02-24 | Discharge: 2020-02-24 | Disposition: A | Discharge status: Home / Self Care | Payer: BC Managed Care – PPO | Source: Ambulatory Visit | Attending: Obstetrics and Gynecology | Admitting: Obstetrics and Gynecology

## 2020-02-24 ENCOUNTER — Ambulatory Visit: Payer: BC Managed Care – PPO

## 2020-02-24 DIAGNOSIS — N644 Mastodynia: Secondary | ICD-10-CM

## 2020-03-27 ENCOUNTER — Other Ambulatory Visit: Payer: Self-pay | Admitting: Rheumatology

## 2020-03-27 NOTE — Telephone Encounter (Signed)
Next Visit: 06/05/2020  Last Visit: 01/04/2020  Last Fill: 12/21/2019  DX: Other ulcerative colitis without complication   Current Dose per office note 01/04/2020, methotrexate 4 tablets every 7 days  Labs: 01/04/2020, CBC is normal. Glucose is mildly elevated most likely not a fasting sample. ALT is mildly elevated. Patient is on methotrexate and statins. Continue to monitor labs.   I called patient to have labs drawn.  Okay to refill MTX?

## 2020-04-25 ENCOUNTER — Other Ambulatory Visit: Payer: Self-pay

## 2020-04-25 DIAGNOSIS — Z79899 Other long term (current) drug therapy: Secondary | ICD-10-CM

## 2020-04-26 LAB — COMPLETE METABOLIC PANEL WITH GFR
AG Ratio: 1.5 (calc) (ref 1.0–2.5)
ALT: 25 U/L (ref 6–29)
AST: 21 U/L (ref 10–35)
Albumin: 4.1 g/dL (ref 3.6–5.1)
Alkaline phosphatase (APISO): 98 U/L (ref 37–153)
BUN: 17 mg/dL (ref 7–25)
CO2: 27 mmol/L (ref 20–32)
Calcium: 9.2 mg/dL (ref 8.6–10.4)
Chloride: 102 mmol/L (ref 98–110)
Creat: 0.93 mg/dL (ref 0.50–1.05)
GFR, Est African American: 80 mL/min/{1.73_m2} (ref >=60)
GFR, Est Non African American: 69 mL/min/{1.73_m2} (ref >=60)
Globulin: 2.7 g/dL (calc) (ref 1.9–3.7)
Glucose, Bld: 168 mg/dL — ABNORMAL HIGH (ref 65–99)
Potassium: 4 mmol/L (ref 3.5–5.3)
Sodium: 139 mmol/L (ref 135–146)
Total Bilirubin: 0.3 mg/dL (ref 0.2–1.2)
Total Protein: 6.8 g/dL (ref 6.1–8.1)

## 2020-04-26 LAB — CBC WITH DIFFERENTIAL/PLATELET
Absolute Monocytes: 589 cells/uL (ref 200–950)
Basophils Absolute: 32 cells/uL (ref 0–200)
Basophils Relative: 0.3 %
Eosinophils Absolute: 171 cells/uL (ref 15–500)
Eosinophils Relative: 1.6 %
HCT: 36.8 % (ref 35.0–45.0)
Hemoglobin: 12.2 g/dL (ref 11.7–15.5)
Lymphs Abs: 2750 cells/uL (ref 850–3900)
MCH: 29.8 pg (ref 27.0–33.0)
MCHC: 33.2 g/dL (ref 32.0–36.0)
MCV: 90 fL (ref 80.0–100.0)
MPV: 10.4 fL (ref 7.5–12.5)
Monocytes Relative: 5.5 %
Neutro Abs: 7158 cells/uL (ref 1500–7800)
Neutrophils Relative %: 66.9 %
Platelets: 307 10*3/uL (ref 140–400)
RBC: 4.09 10*6/uL (ref 3.80–5.10)
RDW: 13.8 % (ref 11.0–15.0)
Total Lymphocyte: 25.7 %
WBC: 10.7 10*3/uL (ref 3.8–10.8)

## 2020-05-01 ENCOUNTER — Telehealth: Payer: Self-pay

## 2020-05-01 NOTE — Telephone Encounter (Signed)
Submitted a Prior Authorization request to [facility] for HUMIRA via CoverMyMeds. Will update once we receive a response.    Key: [id number]

## 2020-05-02 NOTE — Telephone Encounter (Signed)
Received notification from [facility] regarding a prior authorization for [place]. Authorization has been APPROVED from  05/01/2020 to 04/30/2021.    Key: [id number] - Rx #: [id number]

## 2020-05-17 ENCOUNTER — Other Ambulatory Visit: Payer: Self-pay | Admitting: Rheumatology

## 2020-05-17 DIAGNOSIS — K5180 Other ulcerative colitis without complications: Secondary | ICD-10-CM

## 2020-05-17 NOTE — Telephone Encounter (Signed)
Next Visit: 06/05/2020  Last Visit: 01/04/2020  Last Fill: 11/30/2019  DX: Other ulcerative colitis without complication   Current Dose per office note 01/04/2020, Humira 40 mg every 14 days  Labs: 04/25/2020, Glucose is elevated-168. Rest of CMP WNL. CBC WNL.   TB Gold: 11/09/2019, negative  Okay to refill Humira?

## 2020-05-21 ENCOUNTER — Telehealth: Payer: Self-pay

## 2020-05-21 DIAGNOSIS — K5180 Other ulcerative colitis without complications: Secondary | ICD-10-CM

## 2020-05-21 NOTE — Telephone Encounter (Signed)
RF sent 05/17/2020

## 2020-05-21 NOTE — Telephone Encounter (Signed)
[facility] left a voicemail on Friday, 05/18/20 at 5:35 pm stating they were calling on behalf of [facility] in regards to patient's prescription of Humira 40 mg.  Please call back at [phone]

## 2020-05-22 ENCOUNTER — Other Ambulatory Visit: Payer: Self-pay

## 2020-05-22 ENCOUNTER — Ambulatory Visit (INDEPENDENT_AMBULATORY_CARE_PROVIDER_SITE_OTHER): Payer: BC Managed Care – PPO

## 2020-05-22 ENCOUNTER — Encounter: Payer: Self-pay | Admitting: Podiatry

## 2020-05-22 ENCOUNTER — Ambulatory Visit (INDEPENDENT_AMBULATORY_CARE_PROVIDER_SITE_OTHER): Payer: BC Managed Care – PPO | Admitting: Podiatry

## 2020-05-22 DIAGNOSIS — R1011 Right upper quadrant pain: Secondary | ICD-10-CM | POA: Insufficient documentation

## 2020-05-22 DIAGNOSIS — M778 Other enthesopathies, not elsewhere classified: Secondary | ICD-10-CM

## 2020-05-22 DIAGNOSIS — K469 Unspecified abdominal hernia without obstruction or gangrene: Secondary | ICD-10-CM | POA: Insufficient documentation

## 2020-05-22 DIAGNOSIS — K625 Hemorrhage of anus and rectum: Secondary | ICD-10-CM | POA: Insufficient documentation

## 2020-05-22 DIAGNOSIS — K600 Acute anal fissure: Secondary | ICD-10-CM | POA: Insufficient documentation

## 2020-05-22 DIAGNOSIS — S96919A Strain of unspecified muscle and tendon at ankle and foot level, unspecified foot, initial encounter: Secondary | ICD-10-CM

## 2020-05-22 DIAGNOSIS — R1032 Left lower quadrant pain: Secondary | ICD-10-CM | POA: Insufficient documentation

## 2020-05-22 DIAGNOSIS — R141 Gas pain: Secondary | ICD-10-CM | POA: Insufficient documentation

## 2020-05-22 DIAGNOSIS — Z1211 Encounter for screening for malignant neoplasm of colon: Secondary | ICD-10-CM | POA: Insufficient documentation

## 2020-05-22 DIAGNOSIS — R635 Abnormal weight gain: Secondary | ICD-10-CM | POA: Insufficient documentation

## 2020-05-22 DIAGNOSIS — R194 Change in bowel habit: Secondary | ICD-10-CM | POA: Insufficient documentation

## 2020-05-22 DIAGNOSIS — R142 Eructation: Secondary | ICD-10-CM | POA: Insufficient documentation

## 2020-05-22 DIAGNOSIS — M2550 Pain in unspecified joint: Secondary | ICD-10-CM | POA: Insufficient documentation

## 2020-05-23 ENCOUNTER — Telehealth: Payer: Self-pay

## 2020-05-23 NOTE — Telephone Encounter (Signed)
Called [facility] and provided allergy list to pharmacy technician. Also provided clarification that rx is for 1 pen kit with 2 refills.  Nothing further needed.   , PharmD, MPH Clinical Pharmacist (Rheumatology and Pulmonology) 

## 2020-05-23 NOTE — Progress Notes (Signed)
Office Visit Note  Patient: Mercedes [name]             Date of Birth: 1965-11-02           MRN: [id number]             PCP: Manfred Shirts, PA Referring: Libby Maw,* Visit Date: 06/05/2020 Occupation: @GUAROCC @  Subjective:  Medication management.   History of Present Illness: Mercedes [name] is a 55 y.o. female with history of ulcerative colitis, inflammatory arthritis and osteoarthritis.  She states she continues to have some stiffness in the SI joints after prolonged sitting.  She also has some discomfort in her knee joints with climbing stairs and getting up from the chair.  She has not seen any joint swelling.  Her ulcerative colitis is well controlled.  She has been tolerating methotrexate and Humira without any side effects.  She continues to have some discomfort in her shoulders.  She has an appointment coming up at [facility] with an orthopedic surgeon.  She also sees Dr. Nelva Bush for neck pain.  Activities of Daily Living:  Patient reports morning stiffness for 30 minutes.   Patient Reports nocturnal pain.  Difficulty dressing/grooming: Denies Difficulty climbing stairs: Reports Difficulty getting out of chair: Reports Difficulty using hands for taps, buttons, cutlery, and/or writing: Reports  Review of Systems  Constitutional: Negative for fatigue, night sweats, weight gain and weight loss.  HENT: Negative for mouth sores, trouble swallowing, trouble swallowing, mouth dryness and nose dryness.   Eyes: Positive for pain, itching and visual disturbance. Negative for redness and dryness.  Respiratory: Negative for cough, hemoptysis, shortness of breath and difficulty breathing.   Cardiovascular: Negative for chest pain, palpitations, hypertension, irregular heartbeat and swelling in legs/feet.  Gastrointestinal: Negative for abdominal pain, blood in stool, constipation and diarrhea.  Endocrine: Negative for increased urination.  Genitourinary: Negative for painful  urination and vaginal dryness.  Musculoskeletal: Positive for arthralgias, joint pain and morning stiffness. Negative for joint swelling, myalgias, muscle weakness, muscle tenderness and myalgias.  Skin: Negative for color change, rash, hair loss, redness, skin tightness, ulcers and sensitivity to sunlight.  Allergic/Immunologic: Negative for susceptible to infections.  Neurological: Negative for dizziness, numbness, headaches, memory loss, night sweats and weakness.  Hematological: Negative for swollen glands.  Psychiatric/Behavioral: Positive for sleep disturbance. Negative for depressed mood and confusion. The patient is not nervous/anxious.     PMFS History:  Patient Active Problem List   Diagnosis Date Noted  . Abdominal pain, LLQ 05/22/2020  . Acute anal fissure 05/22/2020  . Abnormal weight gain 05/22/2020  . Change in bowel habit 05/22/2020  . Colon cancer screening 05/22/2020  . Flatulence, eructation and gas pain 05/22/2020  . Hemorrhage of rectum and anus 05/22/2020  . Joint pain 05/22/2020  . Morbid obesity ([place]) 05/22/2020  . Right upper quadrant pain 05/22/2020  . Unspecified abdominal hernia without obstruction or gangrene 05/22/2020  . Benign familial tremor 02/28/2019  . Genital herpes simplex type 2 07/29/2017  . 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 History of cholecystectomy 07/30/2016  . Diabetes mellitus ([place]) 01/29/2016  . Impetigo 01/09/2016  . Anniversary reaction 12/20/2015  . Sacroiliitis ([place]) 12/19/2015  . Primary osteoarthritis of both knees 12/19/2015  . Primary osteoarthritis of both feet 12/19/2015  . Depression 12/19/2015  . Peptic ulcer disease 12/19/2015  . Hemorrhoids 12/19/2015  . S/p bilateral carpal tunnel release 12/19/2015  . Dyslipidemia 12/19/2015  . Intermittent asthma without complication 63/84/5364  . Environmental  allergies 12/19/2015  . Attention deficit hyperactivity disorder (ADHD) 12/19/2015  . Ulcerative colitis ([place])  12/18/2015  . High risk medication use 12/18/2015  . Need for hepatitis C screening test 07/12/2015  . Need for prophylactic vaccination against Streptococcus pneumoniae (pneumococcus) 02/07/2015  . Type 2 diabetes mellitus without complication, without long-term current use of insulin ([place]) 02/07/2015  . Anemia 06/13/2013  . Injury of hand, right 06/13/2013  . Prediabetes 06/13/2013  . Anxiety state 12/14/2012  . Hyperlipidemia 12/14/2012  . Hypertension, benign 12/14/2012  . Breast mass 07/09/2012  . Herpes zoster 06/19/2012  . Abnormal mammogram 06/09/2012    Past Medical History:  Diagnosis Date  . Arthritis   . Asthma   . High cholesterol   . Hypertension   . Pre-diabetes   . Seasonal allergies     Family History  Problem Relation Age of Onset  . Diabetes Mother   . Hypertension Mother   . Hyperlipidemia Mother   . Dementia Mother   . COPD Father   . Arthritis Father   . Thyroid disease Sister   . Thyroid disease Sister   . Cancer Sister        blood cancer  . Asthma Sister   . ADD / ADHD Son   . Down syndrome Daughter    Past Surgical History:  Procedure Laterality Date  . CARPAL TUNNEL RELEASE  unsure   bilaterally  . CESAREAN SECTION  12/01  . CHOLECYSTECTOMY  12/01  . PARTIAL MASTECTOMY WITH NEEDLE LOCALIZATION Right 07/09/2012   Procedure: RIGHT PARTIAL MASTECTOMY WITH NEEDLE LOCALIZATION;  Surgeon: Adin Hector, MD;  Location: [facility];  Service: General;  Laterality: Right;  . SHOULDER OPEN ROTATOR CUFF REPAIR  2009   rt shoulder  . TOTAL SHOULDER ARTHROPLASTY     Social History   Social History Narrative  . Not on file   Immunization History  Administered Date(s) Administered  . Influenza Split 09/14/2010, 11/09/2013, 10/14/2014, 10/10/2015, 10/09/2016, 11/10/2017  . Influenza, Seasonal, Injecte, Preservative Fre 09/14/2010, 10/09/2016  . Influenza,inj,Quad PF,6+ Mos 10/10/2015, 11/10/2017  . Influenza,inj,Quad PF,6-35 Mos  01/12/2019  . Influenza,inj,quad, With Preservative 11/10/2017  . Influenza-Unspecified 11/09/2013, 10/14/2014  . PFIZER(Purple Top)SARS-COV-2 Vaccination 03/17/2019, 04/13/2019, 11/12/2019  . Pneumococcal Conjugate-13 02/07/2015  . Pneumococcal Polysaccharide-23 09/06/2013     Objective: Vital Signs: BP 99/66 (BP Location: Left Arm, Patient Position: Sitting, Cuff Size: Normal)   Pulse 76   Ht 5' 5"  (1.651 m)   Wt 236 lb (107 kg)   LMP 05/19/2012   BMI 39.27 kg/m    Physical Exam Vitals and nursing note reviewed.  Constitutional:      Appearance: She is well-developed.  HENT:     Head: Normocephalic and atraumatic.  Eyes:     Conjunctiva/sclera: Conjunctivae normal.  Cardiovascular:     Rate and Rhythm: Normal rate and regular rhythm.     Heart sounds: Normal heart sounds.  Pulmonary:     Effort: Pulmonary effort is normal.     Breath sounds: Normal breath sounds.  Abdominal:     General: Bowel sounds are normal.     Palpations: Abdomen is soft.  Musculoskeletal:     Cervical back: Normal range of motion.  Lymphadenopathy:     Cervical: No cervical adenopathy.  Skin:    General: Skin is warm and dry.     Capillary Refill: Capillary refill takes less than 2 seconds.  Neurological:     Mental Status: She is alert and oriented  to person, place, and time.  Psychiatric:        Behavior: Behavior normal.      Musculoskeletal Exam: She has stiffness with range of motion of her cervical spine.  Shoulder joints with good range of motion with some discomfort.  Elbow joints, wrist joints, MCPs PIPs and DIPs with good range of motion with no synovitis.  Hip joints and knee joints with good range of motion with no synovitis.  She had no tenderness over ankles or MTPs.  CDAI Exam: CDAI Score: -- Patient Global: --; Provider Global: -- Swollen: --; Tender: -- Joint Exam 06/05/2020   No joint exam has been documented for this visit   There is currently no information  documented on the homunculus. Go to the Rheumatology activity and complete the homunculus joint exam.  Investigation: No additional findings.  Imaging: DG Foot Complete Left  Result Date: 05/22/2020 Please see detailed radiograph report in office note.  DG Foot Complete Right  Result Date: 05/22/2020 Please see detailed radiograph report in office note.   Recent Labs: Lab Results  Component Value Date   WBC 10.7 04/25/2020   HGB 12.2 04/25/2020   PLT 307 04/25/2020   NA 139 04/25/2020   K 4.0 04/25/2020   CL 102 04/25/2020   CO2 27 04/25/2020   GLUCOSE 168 (H) 04/25/2020   BUN 17 04/25/2020   CREATININE 0.93 04/25/2020   BILITOT 0.3 04/25/2020   ALKPHOS 97 07/30/2016   AST 21 04/25/2020   ALT 25 04/25/2020   PROT 6.8 04/25/2020   ALBUMIN 4.1 07/30/2016   CALCIUM 9.2 04/25/2020   GFRAA 80 04/25/2020   QFTBGOLDPLUS NEGATIVE 11/09/2019    Speciality Comments: No specialty comments available.  Procedures:  No procedures performed Allergies: Other, Tape, and Tree extract   Assessment / Plan:     Visit Diagnoses: Other ulcerative colitis without complication ([place]) -her ulcerative colitis is well controlled.  Patient is followed by Dr. Collene Mares  High risk medication use - Humira 40 mg every 14 days, methotrexate 4 tablets every 7 days, and folic acid 1 mg daily.  Her labs from April 25, 2020 were within normal limits.  TB gold was negative on November 09, 2019.  I have advised her to get labs every 3 months.  She was advised to hold Humira and methotrexate in case she develops an infection.  She may resume both medications once the infection resolves.  Recommendations regarding immunization was also given and placed in the AVS.  Sacroiliitis (HCC)-she has some stiffness in the SI joints after prolonged sitting.  Chronic pain of both shoulders - Status post right shoulder surgery by Dr. Amedeo Plenty.  She plans to see one of the orthopedic surgeon for her shoulders.  Primary  osteoarthritis of both knees-she has bilateral mild osteoarthritis and chondromalacia patella.  She is crepitus in her knee joints.  Lower extremity muscle strengthening exercises were emphasized.  Primary osteoarthritis of both feet-she continues to have some discomfort in her feet.  DDD (degenerative disc disease), cervical - Followed by Dr. Nelva Bush  S/p bilateral carpal tunnel release - by Dr. Amedeo Plenty.  She states the symptoms of carpal tunnel syndrome are coming back now.  Other medical problems are listed as follows:  History of peptic ulcer disease  History of attention deficit disorder  History of cholecystectomy  Coarse tremors  History of asthma  Orders: No orders of the defined types were placed in this encounter.  No orders of the defined types were placed  in this encounter.   Follow-Up Instructions: Return in about 5 months (around 11/05/2020) for UC, OA.   Bo Merino, MD  Note - This record has been created using Editor, commissioning.  Chart creation errors have been sought, but may not always  have been located. Such creation errors do not reflect on  the standard of medical care.

## 2020-05-23 NOTE — Progress Notes (Signed)
She presents today concerned about pain across the midfoot and dorsal lateral foot as well as anterior ankle.  She states that the pain is sharp and stinging sensations she says that is been going on for the past few weeks she has been caregiving for her mother and her sister recently and that is when it started.  She says they feel tired more than usual she been taking Tylenol she also is concerned about the cracks in her heels.  She states that when she wears sandals and socks a lot.  Objective: Vital signs are stable she is alert and oriented x3.  Pulses are palpable.  Neurologic sensorium is intact deep tendon reflex are intact muscle strength is normal bilateral.  She has no reproducible pain with exception of stress on Lisfranc's joints and midtarsal joint.  Particularly frontal plane range of motion.  Cutaneous evaluation demonstrates supple well-hydrated cutis with exception of the hyperkeratotic rim around the heel with multiple fissures no signs of infection.  Radiographs taken today do not demonstrate any type of osseous abnormalities no acute findings are noted osseous architecture is relatively normal.  Assessment skin fissures bilateral heels.'s sprain or strain of the medial longitudinal arch.  Plan: Discussed etiology pathology conservative versus surgical therapies.  At this point I think shoes during the day instead of sandals will help.  I do think that she is just got a stress or strain.  We did discuss the use of O'Keefe's foot cream.

## 2020-05-23 NOTE — Telephone Encounter (Signed)
[place] left a voicemail (05/22/20 at 7:06 pm) to clarify patient's prescription of Humira.  Please call back at [phone].

## 2020-06-05 ENCOUNTER — Encounter: Payer: Self-pay | Admitting: Rheumatology

## 2020-06-05 ENCOUNTER — Ambulatory Visit (INDEPENDENT_AMBULATORY_CARE_PROVIDER_SITE_OTHER): Payer: BC Managed Care – PPO | Admitting: Rheumatology

## 2020-06-05 ENCOUNTER — Other Ambulatory Visit: Payer: Self-pay

## 2020-06-05 VITALS — BP 99/66 | HR 76 | Ht 65.0 in | Wt 236.0 lb

## 2020-06-05 DIAGNOSIS — Z9889 Other specified postprocedural states: Secondary | ICD-10-CM

## 2020-06-05 DIAGNOSIS — M19072 Primary osteoarthritis, left ankle and foot: Secondary | ICD-10-CM

## 2020-06-05 DIAGNOSIS — M19071 Primary osteoarthritis, right ankle and foot: Secondary | ICD-10-CM

## 2020-06-05 DIAGNOSIS — Z9049 Acquired absence of other specified parts of digestive tract: Secondary | ICD-10-CM

## 2020-06-05 DIAGNOSIS — Z8709 Personal history of other diseases of the respiratory system: Secondary | ICD-10-CM

## 2020-06-05 DIAGNOSIS — K5180 Other ulcerative colitis without complications: Secondary | ICD-10-CM

## 2020-06-05 DIAGNOSIS — Z8711 Personal history of peptic ulcer disease: Secondary | ICD-10-CM

## 2020-06-05 DIAGNOSIS — M25512 Pain in left shoulder: Secondary | ICD-10-CM

## 2020-06-05 DIAGNOSIS — Z79899 Other long term (current) drug therapy: Secondary | ICD-10-CM | POA: Diagnosis not present

## 2020-06-05 DIAGNOSIS — M25511 Pain in right shoulder: Secondary | ICD-10-CM

## 2020-06-05 DIAGNOSIS — G8929 Other chronic pain: Secondary | ICD-10-CM

## 2020-06-05 DIAGNOSIS — M170 Bilateral primary osteoarthritis of knee: Secondary | ICD-10-CM

## 2020-06-05 DIAGNOSIS — M5030 Other cervical disc degeneration, unspecified cervical region: Secondary | ICD-10-CM

## 2020-06-05 DIAGNOSIS — G252 Other specified forms of tremor: Secondary | ICD-10-CM

## 2020-06-05 DIAGNOSIS — M461 Sacroiliitis, not elsewhere classified: Secondary | ICD-10-CM | POA: Diagnosis not present

## 2020-06-05 DIAGNOSIS — Z8659 Personal history of other mental and behavioral disorders: Secondary | ICD-10-CM

## 2020-06-05 NOTE — Patient Instructions (Addendum)
Standing Labs We placed an order today for your standing lab work.   Please have your standing labs drawn in July and every 3 months  TB gold is due in October  If possible, please have your labs drawn 2 weeks prior to your appointment so that the provider can discuss your results at your appointment.  Please note that you may see your imaging and lab results in [place] before we have reviewed them. We may be awaiting multiple results to interpret others before contacting you. Please allow our office up to 72 hours to thoroughly review all of the results before contacting the office for clarification of your results.  We have open lab daily: Monday through Thursday from 1:30-4:30 PM and Friday from 1:30-4:00 PM at the office of Dr. Bo Merino, [facility].   Please be advised, all patients with office appointments requiring lab work will take precedent over walk-in lab work.  If possible, please come for your lab work on Monday and Friday afternoons, as you may experience shorter wait times. The office is located at [address] [place], [place], [place] [address] No appointment is necessary.   Labs are drawn by [facility]. Please bring your co-pay at the time of your lab draw.  You may receive a bill from [facility] for your lab work.  If you wish to have your labs drawn at another location, please call the office 24 hours in advance to send orders.  If you have any questions regarding directions or hours of operation,  please call [phone].   As a reminder, please drink plenty of water prior to coming for your lab work. Thanks!  COVID-19 vaccine recommendations:   COVID-19 vaccine is recommended for everyone (unless you are allergic to a vaccine component), even if you are on a medication that suppresses your immune system.   If you are on Methotrexate, Cellcept (mycophenolate), Rinvoq, Morrie Sheldon, and Olumiant- hold the medication for 1 week after each vaccine. Hold  Methotrexate for 2 weeks after the single dose COVID-19 vaccine.    Do not take Tylenol or any anti-inflammatory medications (NSAIDs) 24 hours prior to the COVID-19 vaccination.   There is no direct evidence about the efficacy of the COVID-19 vaccine in individuals who are on medications that suppress the immune system.   Even if you are fully vaccinated, and you are on any medications that suppress your immune system, please continue to wear a mask, maintain at least six feet social distance and practice hand hygiene.   If you develop a COVID-19 infection, please contact your PCP or our office to determine if you need monoclonal antibody infusion.  The booster vaccine is now available for immunocompromised patients.   Please see the following web sites for updated information.   [email or web address]  Vaccines You are taking a medication(s) that can suppress your immune system.  The following immunizations are recommended: . Flu annually . Covid-19  . Td/Tdap (tetanus, diphtheria, pertussis) every 10 years . Pneumonia (Prevnar 15 then Pneumovax 23 at least 1 year apart.  Alternatively, can take Prevnar 20 without needing additional dose) . Shingrix (after age 84): 2 doses from 4 weeks to 6 months apart  Please check with your PCP to make sure you are up to date.  If you test POSITIVE for COVID19 and have MILD to MODERATE symptoms: o First, call your PCP if you would like to receive COVID19 treatment AND o Hold your medications during the infection and for at least 1  week after your symptoms have resolved: - Injectable medication (Benlysta, Cimzia, Cosentyx, Enbrel, Humira, Orencia, Remicade, Simponi, Stelara, Taltz, Tremfya) - Methotrexate - Leflunomide (Arava) - Mycophenolate (Cellcept) - Morrie Sheldon, Olumiant, or Rinvoq o If you take Actemra or Kevzara, you DO NOT need to hold these for COVID19 infection.  If you  test POSITIVE for COVID19 and have NO symptoms: o First, call your PCP if you would like to receive COVID19 treatment AND o Hold your medications for at least 10 days after the day that you tested positive - Injectable medication (Benlysta, Cimzia, Cosentyx, Enbrel, Humira, Orencia, Remicade, Simponi, Stelara, Taltz, Tremfya) - Methotrexate - Leflunomide (Arava) - Mycophenolate (Cellcept) - Morrie Sheldon, Olumiant, or Rinvoq  While you are on Humira you should get annual skin examination to screen for nonmelanoma skin cancer   Heart Disease Prevention   Your inflammatory disease increases your risk of heart disease which includes heart attack, stroke, atrial fibrillation (irregular heartbeats), high blood pressure, heart failure and atherosclerosis (plaque in the arteries).  It is important to reduce your risk by:   . Keep blood pressure, cholesterol, and blood sugar at healthy levels   . Smoking Cessation   . Maintain a healthy weight  o BMI 20-25   . Eat a healthy diet  o Plenty of fresh fruit, vegetables, and whole grains  o Limit saturated fats, foods high in sodium, and added sugars  o DASH and Mediterranean diet   . Increase physical activity  o Recommend moderate physically activity for 150 minutes per week/ 30 minutes a day for five days a week These can be broken up into three separate ten-minute sessions during the day.   . Reduce Stress  . Meditation, slow breathing exercises, yoga, coloring books  . Dental visits twice a year

## 2020-06-26 ENCOUNTER — Other Ambulatory Visit: Payer: Self-pay | Admitting: Physician Assistant

## 2020-06-26 NOTE — Telephone Encounter (Signed)
Next Visit: 11/06/2020  Last Visit: 06/05/2020  Last Fill: 03/27/2020  DX: Other ulcerative colitis without complication  Current Dose per office note 06/05/2020: methotrexate 4 tablets every 7 days  Labs: 04/25/2020, Glucose is elevated-168. Rest of CMP WNL.  CBC WNL.   Okay to refill MTX?

## 2020-09-18 ENCOUNTER — Other Ambulatory Visit: Payer: Self-pay | Admitting: Physician Assistant

## 2020-09-18 DIAGNOSIS — Z79899 Other long term (current) drug therapy: Secondary | ICD-10-CM

## 2020-09-18 DIAGNOSIS — Z111 Encounter for screening for respiratory tuberculosis: Secondary | ICD-10-CM

## 2020-09-18 DIAGNOSIS — Z9225 Personal history of immunosupression therapy: Secondary | ICD-10-CM

## 2020-09-18 NOTE — Telephone Encounter (Signed)
Next Visit: 11/06/2020   Last Visit: 06/05/2020   Last Fill: 06/26/2020  DX: Other ulcerative colitis without complication   Current Dose per office note 06/05/2020: methotrexate 4 tablets every 7 days   Labs: 04/25/2020, Glucose is elevated-168. Rest of CMP WNL.  CBC WNL.   Patient advised she is due to update labs. Patient states she will come in this week to update labs.     Okay to refill MTX?

## 2020-09-20 ENCOUNTER — Other Ambulatory Visit: Payer: Self-pay | Admitting: *Deleted

## 2020-09-20 DIAGNOSIS — Z9225 Personal history of immunosupression therapy: Secondary | ICD-10-CM

## 2020-09-20 DIAGNOSIS — Z79899 Other long term (current) drug therapy: Secondary | ICD-10-CM

## 2020-09-20 DIAGNOSIS — Z111 Encounter for screening for respiratory tuberculosis: Secondary | ICD-10-CM

## 2020-09-21 NOTE — Progress Notes (Signed)
CBC WNL.  Creatinine is borderline elevated-1.06. Likely due to dehydration. GFR is WNL.  Rest of CMP WNL.

## 2020-09-24 LAB — CBC WITH DIFFERENTIAL/PLATELET
Absolute Monocytes: 921 {cells}/uL (ref 200–950)
Basophils Absolute: 29 {cells}/uL (ref 0–200)
Basophils Relative: 0.3 %
Eosinophils Absolute: 167 {cells}/uL (ref 15–500)
Eosinophils Relative: 1.7 %
HCT: 40.7 % (ref 35.0–45.0)
Hemoglobin: 13.2 g/dL (ref 11.7–15.5)
Lymphs Abs: 3322 {cells}/uL (ref 850–3900)
MCH: 29.5 pg (ref 27.0–33.0)
MCHC: 32.4 g/dL (ref 32.0–36.0)
MCV: 90.8 fL (ref 80.0–100.0)
MPV: 10.3 fL (ref 7.5–12.5)
Monocytes Relative: 9.4 %
Neutro Abs: 5361 {cells}/uL (ref 1500–7800)
Neutrophils Relative %: 54.7 %
Platelets: 340 Thousand/uL (ref 140–400)
RBC: 4.48 Million/uL (ref 3.80–5.10)
RDW: 13.7 % (ref 11.0–15.0)
Total Lymphocyte: 33.9 %
WBC: 9.8 Thousand/uL (ref 3.8–10.8)

## 2020-09-24 LAB — COMPLETE METABOLIC PANEL WITHOUT GFR
AG Ratio: 1.4 (calc) (ref 1.0–2.5)
ALT: 22 U/L (ref 6–29)
AST: 22 U/L (ref 10–35)
Albumin: 4.2 g/dL (ref 3.6–5.1)
Alkaline phosphatase (APISO): 91 U/L (ref 37–153)
BUN/Creatinine Ratio: 14 (calc) (ref 6–22)
BUN: 15 mg/dL (ref 7–25)
CO2: 29 mmol/L (ref 20–32)
Calcium: 9.6 mg/dL (ref 8.6–10.4)
Chloride: 101 mmol/L (ref 98–110)
Creat: 1.06 mg/dL — ABNORMAL HIGH (ref 0.50–1.03)
Globulin: 3.1 g/dL (ref 1.9–3.7)
Glucose, Bld: 84 mg/dL (ref 65–99)
Potassium: 4.1 mmol/L (ref 3.5–5.3)
Sodium: 139 mmol/L (ref 135–146)
Total Bilirubin: 0.3 mg/dL (ref 0.2–1.2)
Total Protein: 7.3 g/dL (ref 6.1–8.1)
eGFR: 62 mL/min/1.73m2

## 2020-09-24 LAB — QUANTIFERON-TB GOLD PLUS
Mitogen-NIL: >10 IU/mL
NIL: 0.03 IU/mL
QuantiFERON-TB Gold Plus: NEGATIVE
TB1-NIL: <0 IU/mL
TB2-NIL: 0 IU/mL

## 2020-09-25 NOTE — Progress Notes (Signed)
TB gold negative

## 2020-10-02 ENCOUNTER — Other Ambulatory Visit: Payer: Self-pay | Admitting: Physician Assistant

## 2020-10-02 DIAGNOSIS — K5180 Other ulcerative colitis without complications: Secondary | ICD-10-CM

## 2020-10-02 NOTE — Telephone Encounter (Signed)
Next Visit: 11/06/2020  Last Visit: 06/05/2020  Last Fill: 05/17/2020  LD:KCCQF ulcerative colitis without complication  Current Dose per office note 06/05/2020: Humira 40 mg every 14 days  Labs: 09/20/2020 CBC WNL.  Creatinine is borderline elevated-1.06. Likely due to dehydration. GFR is WNL.  Rest of CMP WNL  TB Gold: 09/20/2020  Neg   Okay to refill Humira?

## 2020-10-23 NOTE — Progress Notes (Signed)
Office Visit Note  Patient: Mercedes [name]             Date of Birth: February 13, 1965           MRN: [id number]             PCP: Manfred Shirts, PA Referring: Manfred Shirts, PA Visit Date: 11/06/2020 Occupation: @GUAROCC @  Subjective:  Neck pain   History of Present Illness: Mercedes [name] is a 55 y.o. female with history of ulcerative colitis, osteoarthritis, and DDD.  She is humira 40 mg sq injections every 14 days, methotrexate 4 tablets once weekly, and folic acid 1 mg daily.  She has not missed any doses recently and continues to tolerate these medications without any side effects.  She denies any recent ulcerative colitis flares.  She had updated colonoscopy on 03/05/2020 which was not consistent with active inflammation according to the patient.  She reports that she was diagnosed with COVID-19 in August and during that time she started to experience severe pain in her neck and left shoulder.  She was given a prescription for prednisone which did not improve her pain.  She also tried a prescription for hydrocodone which provided minimal relief.  She had an injection performed by Dr. Herma Mering which eventually resolved her symptoms.  She continues to have intermittent symptoms of left-sided radiculopathy.  She is scheduled for an MRI of the C-spine on Sunday.  She denies any lower back pain at this time.  She has not had any SI joint discomfort recently.  She denies any Achilles tinnitus or plantar fasciitis.  She has occasional discomfort in her left knee and toes but denies any joint swelling.   Activities of Daily Living:  Patient reports morning stiffness for 10-15 minutes.   Patient Reports nocturnal pain.  Difficulty dressing/grooming: Denies Difficulty climbing stairs: Reports Difficulty getting out of chair: Reports Difficulty using hands for taps, buttons, cutlery, and/or writing: Reports  Review of Systems  Constitutional:  Negative for fatigue.  HENT:  Negative for mouth sores, mouth  dryness and nose dryness.   Eyes:  Negative for pain, itching and dryness.  Respiratory:  Negative for shortness of breath and difficulty breathing.   Cardiovascular:  Negative for chest pain and palpitations.  Gastrointestinal:  Negative for blood in stool, constipation and diarrhea.  Endocrine: Negative for increased urination.  Genitourinary:  Negative for difficulty urinating.  Musculoskeletal:  Positive for joint pain, joint pain and morning stiffness. Negative for joint swelling.  Skin:  Negative for color change, rash and redness.  Allergic/Immunologic: Negative for susceptible to infections.  Neurological:  Negative for dizziness, numbness, headaches, memory loss and weakness.  Hematological:  Negative for bruising/bleeding tendency.  Psychiatric/Behavioral:  Negative for confusion.    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   History of cholecystectomy 07/30/2016   Diabetes mellitus ([place]) 01/29/2016   Impetigo 01/09/2016  Anniversary reaction 12/20/2015   Sacroiliitis ([place]) 12/19/2015   Primary osteoarthritis of both knees 12/19/2015   Primary osteoarthritis of both feet 12/19/2015   Depression 12/19/2015   Peptic ulcer disease 12/19/2015   Hemorrhoids 12/19/2015   S/p bilateral carpal tunnel release 12/19/2015   Dyslipidemia 12/19/2015   Intermittent asthma without complication 97/67/3419    Environmental allergies 12/19/2015   Attention deficit hyperactivity disorder (ADHD) 12/19/2015   Ulcerative colitis ([place]) 12/18/2015   High risk medication use 12/18/2015   Need for hepatitis C screening test 07/12/2015   Need for prophylactic vaccination against Streptococcus pneumoniae (pneumococcus) 02/07/2015   Type 2 diabetes mellitus without complication, without long-term current use of insulin ([place]) 02/07/2015   Anemia 06/13/2013   Injury of hand, right 06/13/2013   Prediabetes 06/13/2013   Anxiety state 12/14/2012   Hyperlipidemia 12/14/2012   Hypertension, benign 12/14/2012   Breast mass 07/09/2012   Herpes zoster 06/19/2012   Abnormal mammogram 06/09/2012    Past Medical History:  Diagnosis Date   Arthritis    Asthma    High cholesterol    Hypertension    Pre-diabetes    Seasonal allergies     Family History  Problem Relation Age of Onset   Diabetes Mother    Hypertension Mother    Hyperlipidemia Mother    Dementia Mother    COPD Father    Arthritis Father    Thyroid disease Sister    Thyroid disease Sister    Cancer Sister        blood cancer   Asthma Sister    ADD / ADHD Son    Down syndrome Daughter    Past Surgical History:  Procedure Laterality Date   CARPAL TUNNEL RELEASE  unsure   bilaterally   CESAREAN SECTION  12/01   CHOLECYSTECTOMY  12/01   PARTIAL MASTECTOMY WITH NEEDLE LOCALIZATION Right 07/09/2012   Procedure: RIGHT PARTIAL MASTECTOMY WITH NEEDLE LOCALIZATION;  Surgeon: Adin Hector, MD;  Location: [facility];  Service: General;  Laterality: Right;   SHOULDER OPEN ROTATOR CUFF REPAIR  2009   rt shoulder   TOTAL SHOULDER ARTHROPLASTY     Social History   Social History Narrative   Not on file   Immunization History  Administered Date(s) Administered   Influenza Split 09/14/2010, 11/09/2013, 10/14/2014, 10/10/2015, 10/09/2016, 11/10/2017   Influenza, Seasonal, Injecte, Preservative Fre 09/14/2010, 10/09/2016    Influenza,inj,Quad PF,6+ Mos 10/10/2015, 11/10/2017   Influenza,inj,Quad PF,6-35 Mos 01/12/2019   Influenza,inj,quad, With Preservative 11/10/2017   Influenza-Unspecified 11/09/2013, 10/14/2014   PFIZER(Purple Top)SARS-COV-2 Vaccination 03/17/2019, 04/13/2019, 11/12/2019   Pneumococcal Conjugate-13 02/07/2015   Pneumococcal Polysaccharide-23 09/06/2013     Objective: Vital Signs: BP 115/74 (BP Location: Left Arm, Patient Position: Sitting, Cuff Size: Normal)   Pulse 83   Ht 5' 5"  (1.651 m)   Wt 223 lb (101.2 kg)   LMP 05/19/2012   BMI 37.11 kg/m    Physical Exam Vitals and nursing note reviewed.  Constitutional:      Appearance: She is well-developed.  HENT:     Head: Normocephalic and atraumatic.  Eyes:     Conjunctiva/sclera: Conjunctivae normal.  Pulmonary:     Effort: Pulmonary effort is normal.  Abdominal:     Palpations: Abdomen is soft.  Musculoskeletal:     Cervical back: Normal range of motion.  Skin:    General: Skin is warm and dry.     Capillary Refill: Capillary refill takes less than 2 seconds.  Neurological:  Mental Status: She is alert and oriented to person, place, and time.  Psychiatric:        Behavior: Behavior normal.     Musculoskeletal Exam: C-spine has painful range of motion especially with lateral rotation.  No midline spinal tenderness or SI joint tenderness.  Shoulder joints, elbow joints, wrist joints, MCPs, PIPs, DIPs have good range of motion with no synovitis.  PIP and DIP prominence consistent with osteoarthritis of both hands.  Complete fist formation bilaterally.  Hip joints have good range of motion with no discomfort.  Knee joints have good range of motion with no warmth or effusion.  Ankle joints have good range of motion with no tenderness or joint swelling.  No evidence of Achilles tendonitis or plantar fasciitis.  No tenderness over MTP joints.  CDAI Exam: CDAI Score: -- Patient Global: --; Provider Global: -- Swollen: --;  Tender: -- Joint Exam 11/06/2020   No joint exam has been documented for this visit   There is currently no information documented on the homunculus. Go to the Rheumatology activity and complete the homunculus joint exam.  Investigation: No additional findings.  Imaging: No results found.  Recent Labs: Lab Results  Component Value Date   WBC 9.8 09/20/2020   HGB 13.2 09/20/2020   PLT 340 09/20/2020   NA 139 09/20/2020   K 4.1 09/20/2020   CL 101 09/20/2020   CO2 29 09/20/2020   GLUCOSE 84 09/20/2020   BUN 15 09/20/2020   CREATININE 1.06 (H) 09/20/2020   BILITOT 0.3 09/20/2020   ALKPHOS 97 07/30/2016   AST 22 09/20/2020   ALT 22 09/20/2020   PROT 7.3 09/20/2020   ALBUMIN 4.1 07/30/2016   CALCIUM 9.6 09/20/2020   GFRAA 80 04/25/2020   QFTBGOLDPLUS NEGATIVE 09/20/2020    Speciality Comments: No specialty comments available.  Procedures:  No procedures performed Allergies: Other, Tape, and Tree extract   Assessment / Plan:     Visit Diagnoses: Other ulcerative colitis without complication ([place]) - Patient is followed by Dr. Collene Mares.  She had a routine colonoscopy on 03/05/2020.  Recommended repeat colonoscopy in 2 years for surveillance.  She has not had any signs or symptoms of a flare recently.  She is not experiencing any constipation, diarrhea, or blood in her stool.  No signs of inflammatory arthritis were noted on examination today.  She had no synovitis or SI joint tenderness to palpation.  No evidence of Achilles tinnitus or plantar fasciitis.  She has not been experiencing any signs or symptoms of eye inflammation.  No conjunctival injection was noted on examination today.   She has clinically been doing well on Humira 40 mg sq injections every 14 days, methotrexate 4 tablets by mouth once weekly, folic acid 1 mg by mouth daily.  She has not missed any of these medications recently.  She was advised to notify us if she develops signs or symptoms of a flare.  She will  remain on the current treatment regimen.  She will follow-up in the office in 5 months.  High risk medication use - Humira 40 mg sq injections every 14 days, methotrexate 4 tablets every 7 days, and folic acid 1 mg daily. CBC and CMP updated on 09/20/20.  She will be due to update lab work in December and every 3 months.  Standing orders for CBC and CMP remain in place.  TB gold negative on 09/20/20.  Discussed the importance of holding Humira and methotrexate if she develops signs or symptoms  of an infection and to resume once the infection has completely cleared.  She voiced understanding. Also discussed the importance of yearly skin examinations to monitor for skin cancer while on Humira.  Sacroiliitis [facility]): No SI joint tenderness on examination today.  She has not been experiencing any nocturnal pain or morning stiffness in her SI joints recently.  Chronic pain of both shoulders - Status post right shoulder surgery by Dr. Amedeo Plenty.  She has good range of motion of both shoulder joints with no discomfort at this time.  Primary osteoarthritis of both knees - Bilateral mild osteoarthritis and chondromalacia patella.  She has good range of motion of both knee joints with crepitus.  No warmth or effusion was noted.  She has occasional discomfort in the left knee but has not noticed any inflammation.  Primary osteoarthritis of both feet: She has occasional discomfort in both feet especially in her toes.  She has good range of motion of both ankle joints with no tenderness or inflammation.  No evidence of Achilles tendinitis or plantar fasciitis was noted on examination.  Discussed the importance of wearing proper fitting shoes.  S/p bilateral carpal tunnel release - by Dr. Amedeo Plenty.  Asymptomatic at this time.  DDD (degenerative disc disease), cervical - Followed by Dr. Nelva Bush.  She experienced an exacerbation of her neck pain after being diagnosed with COVID in August 2022.  She was experiencing left-sided  radiculopathy at that time.  She tried taking a prednisone taper which did not provide any relief.  She also tried taking hydrocodone which provided minimal relief.  She had an injection performed by Dr. Herma Mering which eventually resolved her discomfort.  She continues to experience intermittent symptoms of radiculopathy in the left upper extremity.  She is scheduled for a C-spine MRI on Sunday for further evaluation.  Other medical conditions are listed as follows:   History of peptic ulcer disease  History of asthma  History of attention deficit disorder  History of cholecystectomy  Coarse tremors  Orders: No orders of the defined types were placed in this encounter.  No orders of the defined types were placed in this encounter.    Follow-Up Instructions: Return in about 5 months (around 04/06/2021) for UC, Osteoarthritis, DDD.   Ofilia Neas, PA-C  Note - This record has been created using Dragon software.  Chart creation errors have been sought, but may not always  have been located. Such creation errors do not reflect on  the standard of medical care.

## 2020-11-06 ENCOUNTER — Encounter: Payer: Self-pay | Admitting: Physician Assistant

## 2020-11-06 ENCOUNTER — Other Ambulatory Visit: Payer: Self-pay

## 2020-11-06 ENCOUNTER — Ambulatory Visit (INDEPENDENT_AMBULATORY_CARE_PROVIDER_SITE_OTHER): Payer: BC Managed Care – PPO | Admitting: Physician Assistant

## 2020-11-06 VITALS — BP 115/74 | HR 83 | Ht 65.0 in | Wt 223.0 lb

## 2020-11-06 DIAGNOSIS — Z8709 Personal history of other diseases of the respiratory system: Secondary | ICD-10-CM

## 2020-11-06 DIAGNOSIS — M170 Bilateral primary osteoarthritis of knee: Secondary | ICD-10-CM

## 2020-11-06 DIAGNOSIS — Z8711 Personal history of peptic ulcer disease: Secondary | ICD-10-CM

## 2020-11-06 DIAGNOSIS — Z8659 Personal history of other mental and behavioral disorders: Secondary | ICD-10-CM

## 2020-11-06 DIAGNOSIS — M25512 Pain in left shoulder: Secondary | ICD-10-CM

## 2020-11-06 DIAGNOSIS — Z9049 Acquired absence of other specified parts of digestive tract: Secondary | ICD-10-CM

## 2020-11-06 DIAGNOSIS — Z79899 Other long term (current) drug therapy: Secondary | ICD-10-CM | POA: Diagnosis not present

## 2020-11-06 DIAGNOSIS — K5180 Other ulcerative colitis without complications: Secondary | ICD-10-CM

## 2020-11-06 DIAGNOSIS — M5030 Other cervical disc degeneration, unspecified cervical region: Secondary | ICD-10-CM

## 2020-11-06 DIAGNOSIS — G252 Other specified forms of tremor: Secondary | ICD-10-CM

## 2020-11-06 DIAGNOSIS — M461 Sacroiliitis, not elsewhere classified: Secondary | ICD-10-CM | POA: Diagnosis not present

## 2020-11-06 DIAGNOSIS — M19071 Primary osteoarthritis, right ankle and foot: Secondary | ICD-10-CM

## 2020-11-06 DIAGNOSIS — Z9889 Other specified postprocedural states: Secondary | ICD-10-CM

## 2020-11-06 DIAGNOSIS — M25511 Pain in right shoulder: Secondary | ICD-10-CM | POA: Diagnosis not present

## 2020-11-06 DIAGNOSIS — G8929 Other chronic pain: Secondary | ICD-10-CM

## 2020-11-06 DIAGNOSIS — M19072 Primary osteoarthritis, left ankle and foot: Secondary | ICD-10-CM

## 2020-11-06 NOTE — Patient Instructions (Signed)
Standing Labs We placed an order today for your standing lab work.   Please have your standing labs drawn in December and every 3 months    If possible, please have your labs drawn 2 weeks prior to your appointment so that the provider can discuss your results at your appointment.  Please note that you may see your imaging and lab results in [place] before we have reviewed them. We may be awaiting multiple results to interpret others before contacting you. Please allow our office up to 72 hours to thoroughly review all of the results before contacting the office for clarification of your results.  We have open lab daily: Monday through Thursday from 1:30-4:30 PM and Friday from 1:30-4:00 PM at the office of Dr. Bo Merino, [facility].   Please be advised, all patients with office appointments requiring lab work will take precedent over walk-in lab work.  If possible, please come for your lab work on Monday and Friday afternoons, as you may experience shorter wait times. The office is located at [address], [place], [place], [place] [address] No appointment is necessary.   Labs are drawn by [facility]. Please bring your co-pay at the time of your lab draw.  You may receive a bill from [facility] for your lab work.  If you wish to have your labs drawn at another location, please call the office 24 hours in advance to send orders.  If you have any questions regarding directions or hours of operation,  please call [phone].   As a reminder, please drink plenty of water prior to coming for your lab work. Thanks!

## 2020-11-23 ENCOUNTER — Other Ambulatory Visit: Payer: Self-pay | Admitting: Physician Assistant

## 2020-11-26 ENCOUNTER — Other Ambulatory Visit: Payer: Self-pay

## 2020-11-26 MED ORDER — METHOTREXATE SODIUM 2.5 MG PO TABS: ORAL_TABLET | ORAL | 0 refills | Status: DC

## 2020-11-26 NOTE — Telephone Encounter (Signed)
crPatient called requesting prescription refill of Methotrexate.  [facility] needs a new prescription.

## 2020-11-26 NOTE — Telephone Encounter (Signed)
Next Visit: 04/02/2021  Last Visit: 11/06/2020  Last Fill: 09/18/2020  DX: Other ulcerative colitis without complication   Current Dose per office note 11/06/2020: methotrexate 4 tablets every 7 days  Labs: 09/20/2020 CBC WNL.  Creatinine is borderline elevated-1.06. Likely due to dehydration. GFR is WNL.  Rest of CMP WNL.   Okay to refill MTX?

## 2020-12-28 ENCOUNTER — Other Ambulatory Visit: Payer: Self-pay | Admitting: Physician Assistant

## 2020-12-28 DIAGNOSIS — K5180 Other ulcerative colitis without complications: Secondary | ICD-10-CM

## 2020-12-28 NOTE — Telephone Encounter (Signed)
Next Visit: 04/02/2021  Last Visit: 11/06/2020  Last Fill: 09/20/2020  ZO:[id number] ulcerative colitis without complication   Current Dose per office note 11/06/2020: Humira 40 mg sq injections every 14 days  Labs: 09/20/2020 CBC WNL.  Creatinine is borderline elevated-1.06. Likely due to dehydration. GFR is WNL.  Rest of CMP WNL.    TB Gold: 09/20/2020 Neg    Patient advised she is due to update labs. Patient states she will be in next week to update.   Okay to refill Humira?

## 2021-01-01 ENCOUNTER — Other Ambulatory Visit: Payer: Self-pay | Admitting: *Deleted

## 2021-01-01 DIAGNOSIS — Z79899 Other long term (current) drug therapy: Secondary | ICD-10-CM

## 2021-01-01 LAB — COMPLETE METABOLIC PANEL WITH GFR
AG Ratio: 1.4 (calc) (ref 1.0–2.5)
ALT: 20 U/L (ref 6–29)
AST: 18 U/L (ref 10–35)
Albumin: 4.4 g/dL (ref 3.6–5.1)
Alkaline phosphatase (APISO): 102 U/L (ref 37–153)
BUN: 14 mg/dL (ref 7–25)
CO2: 27 mmol/L (ref 20–32)
Calcium: 9.4 mg/dL (ref 8.6–10.4)
Chloride: 102 mmol/L (ref 98–110)
Creat: 0.82 mg/dL (ref 0.50–1.03)
Globulin: 3.2 g/dL (calc) (ref 1.9–3.7)
Glucose, Bld: 78 mg/dL (ref 65–99)
Potassium: 4.2 mmol/L (ref 3.5–5.3)
Sodium: 137 mmol/L (ref 135–146)
Total Bilirubin: 0.5 mg/dL (ref 0.2–1.2)
Total Protein: 7.6 g/dL (ref 6.1–8.1)
eGFR: 83 mL/min/{1.73_m2} (ref >=60)

## 2021-01-01 LAB — CBC WITH DIFFERENTIAL/PLATELET
Absolute Monocytes: 830 cells/uL (ref 200–950)
Basophils Absolute: 50 cells/uL (ref 0–200)
Basophils Relative: 0.5 %
Eosinophils Absolute: 160 cells/uL (ref 15–500)
Eosinophils Relative: 1.6 %
HCT: 41.1 % (ref 35.0–45.0)
Hemoglobin: 13.3 g/dL (ref 11.7–15.5)
Lymphs Abs: 3550 cells/uL (ref 850–3900)
MCH: 29.6 pg (ref 27.0–33.0)
MCHC: 32.4 g/dL (ref 32.0–36.0)
MCV: 91.5 fL (ref 80.0–100.0)
MPV: 10.2 fL (ref 7.5–12.5)
Monocytes Relative: 8.3 %
Neutro Abs: 5410 cells/uL (ref 1500–7800)
Neutrophils Relative %: 54.1 %
Platelets: 319 10*3/uL (ref 140–400)
RBC: 4.49 10*6/uL (ref 3.80–5.10)
RDW: 13.4 % (ref 11.0–15.0)
Total Lymphocyte: 35.5 %
WBC: 10 10*3/uL (ref 3.8–10.8)

## 2021-01-02 NOTE — Progress Notes (Signed)
CBC and CMP WNL

## 2021-01-29 ENCOUNTER — Other Ambulatory Visit: Payer: Self-pay | Admitting: Physician Assistant

## 2021-01-29 DIAGNOSIS — K5180 Other ulcerative colitis without complications: Secondary | ICD-10-CM

## 2021-01-29 NOTE — Telephone Encounter (Signed)
Next Visit: 04/02/2021   Last Visit: 11/06/2020   Last Fill: 12/28/2020 (30 day supply)   DX:Other ulcerative colitis without complication    Current Dose per office note 11/06/2020: Humira 40 mg sq injections every 14 days   Labs: 01/01/2021  CBC and CMP WNL   TB Gold: 09/20/2020 Neg      Okay to refill Humira?

## 2021-02-18 ENCOUNTER — Other Ambulatory Visit: Payer: Self-pay | Admitting: Rheumatology

## 2021-02-18 NOTE — Telephone Encounter (Signed)
Next Visit: 04/02/2021   Last Visit: 11/06/2020   Last Fill: 11/26/2020  IP:[id number] ulcerative colitis without complication    Current Dose per office note 11/06/2020: methotrexate 4 tablets every 7 days  Labs: 01/01/2021  CBC and CMP WNL  Okay to refill MTX?

## 2021-03-08 ENCOUNTER — Telehealth: Payer: Self-pay

## 2021-03-08 MED ORDER — METHOTREXATE 2.5 MG PO TABS: ORAL_TABLET | ORAL | 0 refills | Status: DC

## 2021-03-08 NOTE — Telephone Encounter (Signed)
Patient called stating she needs her prescription of Methotrexate sent to [facility].  Patient states she has 4 tablets remaining which she will take tomorrow, 03/09/21.   Phone [phone]

## 2021-03-08 NOTE — Telephone Encounter (Signed)
Patient states she has switched pharmacies from [facility]. Patient states she is now using [facility]. Patient states she did not received the prescription that was sent to [facility]. Sending the prescription to requested pharmacy.

## 2021-03-28 NOTE — Progress Notes (Signed)
? ?Office Visit Note ? ?Patient: Mercedes [name]             ?Date of Birth: 1965-11-13           ?MRN: [id number]             ?PCP: Manfred Shirts, PA ?Referring: Manfred Shirts, PA ?Visit Date: 04/10/2021 ?Occupation: @GUAROCC @ ? ?Subjective:  ?Arthritis (Left knee pain) ? ? ?History of Present Illness: Mercedes [name] is a 56 y.o. female with history of ulcerative colitis, sacroiliitis, osteoarthritis and degenerative disc disease.  She states she has been having intermittent pain and swelling in her left knee joint.  Her shoulders are better.  She was having discomfort in her cervical region for which she had cortisone injections by Dr. Nelva Bush about 2 months ago.  She states she had a lot of relief from the cortisone injections.  She denies any symptoms of ulcerative colitis. ? ?Activities of Daily Living:  ?Patient reports morning stiffness for 30 minutes.   ?Patient Reports nocturnal pain.  ?Difficulty dressing/grooming: Denies ?Difficulty climbing stairs: Reports ?Difficulty getting out of chair: Reports ?Difficulty using hands for taps, buttons, cutlery, and/or writing: Reports ? ?Review of Systems  ?Constitutional:  Positive for fatigue.  ?HENT:  Positive for mouth dryness.   ?Eyes:  Positive for dryness.  ?Respiratory:  Negative for shortness of breath.   ?Cardiovascular:  Negative for swelling in legs/feet.  ?Gastrointestinal:  Negative for constipation.  ?Endocrine: Positive for heat intolerance.  ?Genitourinary:  Negative for difficulty urinating.  ?Musculoskeletal:  Positive for joint pain, gait problem, joint pain, joint swelling and morning stiffness.  ?Skin:  Negative for rash.  ?Allergic/Immunologic: Negative for susceptible to infections.  ?Neurological:  Negative for numbness.  ?Hematological:  Negative for bruising/bleeding tendency.  ?Psychiatric/Behavioral:  Negative for sleep disturbance.   ? ?PMFS History:  ?Patient Active Problem List  ? Diagnosis Date Noted  ? Abdominal pain, LLQ 05/22/2020  ? Acute  anal fissure 05/22/2020  ? Abnormal weight gain 05/22/2020  ? Change in bowel habit 05/22/2020  ? Colon cancer screening 05/22/2020  ? Flatulence, eructation and gas pain 05/22/2020  ? Hemorrhage of rectum and anus 05/22/2020  ? Joint pain 05/22/2020  ? Morbid obesity ([place]) 05/22/2020  ? Right upper quadrant pain 05/22/2020  ? Unspecified abdominal hernia without obstruction or gangrene 05/22/2020  ? Benign familial tremor 02/28/2019  ? Genital herpes simplex type 2 07/29/2017  ? 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 History of cholecystectomy 07/30/2016  ? Diabetes mellitus ([place]) 01/29/2016  ? Impetigo 01/09/2016  ? Anniversary reaction 12/20/2015  ? Sacroiliitis ([place]) 12/19/2015  ? Primary osteoarthritis of both knees 12/19/2015  ? Primary osteoarthritis of both feet 12/19/2015  ? Depression 12/19/2015  ? Peptic ulcer disease 12/19/2015  ? Hemorrhoids 12/19/2015  ? S/p bilateral carpal tunnel release 12/19/2015  ? Dyslipidemia 12/19/2015  ? Intermittent asthma without complication 11/06/8525  ? Environmental allergies 12/19/2015  ? Attention deficit hyperactivity disorder (ADHD) 12/19/2015  ? Ulcerative colitis ([place]) 12/18/2015  ? High risk medication use 12/18/2015  ? Need for hepatitis C screening test 07/12/2015  ? Need for prophylactic vaccination against Streptococcus pneumoniae (pneumococcus) 02/07/2015  ? Type 2 diabetes mellitus without complication, without long-term current use of insulin ([place]) 02/07/2015  ? Anemia 06/13/2013  ? Injury of hand, right 06/13/2013  ? Prediabetes 06/13/2013  ? Anxiety state 12/14/2012  ? Hyperlipidemia 12/14/2012  ? Hypertension, benign 12/14/2012  ? Breast mass 07/09/2012  ? Herpes zoster 06/19/2012  ?  Abnormal mammogram 06/09/2012  ?  ?Past Medical History:  ?Diagnosis Date  ? Arthritis   ? Asthma   ? High cholesterol   ? Hypertension   ? Pre-diabetes   ? Seasonal allergies   ?  ?Family History  ?Problem Relation Age of Onset  ? Diabetes Mother   ? Hypertension  Mother   ? Hyperlipidemia Mother   ? Dementia Mother   ? COPD Father   ? Arthritis Father   ? Thyroid disease Sister   ? Thyroid disease Sister   ? Cancer Sister   ?     blood cancer  ? Asthma Sister   ? ADD / ADHD Son   ? Down syndrome Daughter   ? ?Past Surgical History:  ?Procedure Laterality Date  ? CARPAL TUNNEL RELEASE  unsure  ? bilaterally  ? CESAREAN SECTION  12/01  ? CHOLECYSTECTOMY  12/01  ? PARTIAL MASTECTOMY WITH NEEDLE LOCALIZATION Right 07/09/2012  ? Procedure: RIGHT PARTIAL MASTECTOMY WITH NEEDLE LOCALIZATION;  Surgeon: Adin Hector, MD;  Location: [facility];  Service: General;  Laterality: Right;  ? SHOULDER OPEN ROTATOR CUFF REPAIR  2009  ? rt shoulder  ? TOTAL SHOULDER ARTHROPLASTY    ? ?Social History  ? ?Social History Narrative  ? Not on file  ? ?Immunization History  ?Administered Date(s) Administered  ? Influenza Split 09/14/2010, 11/09/2013, 10/14/2014, 10/10/2015, 10/09/2016, 11/10/2017  ? Influenza, Seasonal, Injecte, Preservative Fre 09/14/2010, 10/09/2016  ? Influenza,inj,Quad PF,6+ Mos 10/10/2015, 11/10/2017  ? Influenza,inj,Quad PF,6-35 Mos 01/12/2019  ? Influenza,inj,quad, With Preservative 11/10/2017  ? Influenza-Unspecified 11/09/2013, 10/14/2014  ? PFIZER(Purple Top)SARS-COV-2 Vaccination 03/17/2019, 04/13/2019, 11/12/2019  ? Pneumococcal Conjugate-13 02/07/2015  ? Pneumococcal Polysaccharide-23 09/06/2013  ?  ? ?Objective: ?Vital Signs: BP 111/72 (BP Location: Left Arm, Patient Position: Sitting, Cuff Size: Normal)   Pulse 83   Resp 15   Ht 5' 5"  (1.651 m)   Wt 230 lb (104.3 kg)   LMP 05/19/2012   BMI 38.27 kg/m?   ? ?Physical Exam ?Vitals and nursing note reviewed.  ?Constitutional:   ?   Appearance: She is well-developed.  ?HENT:  ?   Head: Normocephalic and atraumatic.  ?Eyes:  ?   Conjunctiva/sclera: Conjunctivae normal.  ?Cardiovascular:  ?   Rate and Rhythm: Normal rate and regular rhythm.  ?   Heart sounds: Normal heart sounds.  ?Pulmonary:  ?    Effort: Pulmonary effort is normal.  ?   Breath sounds: Normal breath sounds.  ?Abdominal:  ?   General: Bowel sounds are normal.  ?   Palpations: Abdomen is soft.  ?Musculoskeletal:  ?   Cervical back: Normal range of motion.  ?Lymphadenopathy:  ?   Cervical: No cervical adenopathy.  ?Skin: ?   General: Skin is warm and dry.  ?   Capillary Refill: Capillary refill takes less than 2 seconds.  ?Neurological:  ?   Mental Status: She is alert and oriented to person, place, and time.  ?Psychiatric:     ?   Behavior: Behavior normal.  ?  ? ?Musculoskeletal Exam: C-spine, thoracic and lumbar spine were in good range of motion.  She had mild tenderness over SI joints.  Shoulder joints, elbow joints, wrist joints with good range of motion.  She had bilateral PIP and DIP thickening with mild synovitis.  Hip joints and knee joints with good range of motion.  No warmth swelling or effusion was noted.  Crepitus was noted in the left knee joint.  There was  no tenderness over ankles or MTPs.  There was no evidence of Achilles tendinitis of [place] fasciitis. ? ?CDAI Exam: ?CDAI Score: -- ?Patient Global: --; Provider Global: -- ?Swollen: --; Tender: -- ?Joint Exam 04/10/2021  ? ?No joint exam has been documented for this visit  ? ?There is currently no information documented on the homunculus. Go to the Rheumatology activity and complete the homunculus joint exam. ? ?Investigation: ?No additional findings. ? ?Imaging: ?No results found. ? ?Recent Labs: ?Lab Results  ?Component Value Date  ? WBC 10.0 01/01/2021  ? HGB 13.3 01/01/2021  ? PLT 319 01/01/2021  ? NA 137 01/01/2021  ? K 4.2 01/01/2021  ? CL 102 01/01/2021  ? CO2 27 01/01/2021  ? GLUCOSE 78 01/01/2021  ? BUN 14 01/01/2021  ? CREATININE 0.82 01/01/2021  ? BILITOT 0.5 01/01/2021  ? ALKPHOS 97 07/30/2016  ? AST 18 01/01/2021  ? ALT 20 01/01/2021  ? PROT 7.6 01/01/2021  ? ALBUMIN 4.1 07/30/2016  ? CALCIUM 9.4 01/01/2021  ? GFRAA 80 04/25/2020  ? QFTBGOLDPLUS NEGATIVE  09/20/2020  ? ? ?Speciality Comments: No specialty comments available. ? ?Procedures:  ?No procedures performed ?Allergies: Other, Tape, and Tree extract  ? ?Assessment / Plan:     ?Visit Diagnoses: Other ulcerative coli

## 2021-04-02 ENCOUNTER — Ambulatory Visit: Payer: BC Managed Care – PPO | Admitting: Rheumatology

## 2021-04-10 ENCOUNTER — Encounter: Payer: Self-pay | Admitting: Rheumatology

## 2021-04-10 ENCOUNTER — Other Ambulatory Visit: Payer: Self-pay | Admitting: Physician Assistant

## 2021-04-10 ENCOUNTER — Ambulatory Visit (INDEPENDENT_AMBULATORY_CARE_PROVIDER_SITE_OTHER): Payer: BC Managed Care – PPO | Admitting: Rheumatology

## 2021-04-10 VITALS — BP 111/72 | HR 83 | Resp 15 | Ht 65.0 in | Wt 230.0 lb

## 2021-04-10 DIAGNOSIS — M461 Sacroiliitis, not elsewhere classified: Secondary | ICD-10-CM

## 2021-04-10 DIAGNOSIS — M19071 Primary osteoarthritis, right ankle and foot: Secondary | ICD-10-CM

## 2021-04-10 DIAGNOSIS — Z8709 Personal history of other diseases of the respiratory system: Secondary | ICD-10-CM

## 2021-04-10 DIAGNOSIS — Z9889 Other specified postprocedural states: Secondary | ICD-10-CM

## 2021-04-10 DIAGNOSIS — K5180 Other ulcerative colitis without complications: Secondary | ICD-10-CM

## 2021-04-10 DIAGNOSIS — M25511 Pain in right shoulder: Secondary | ICD-10-CM

## 2021-04-10 DIAGNOSIS — Z8711 Personal history of peptic ulcer disease: Secondary | ICD-10-CM

## 2021-04-10 DIAGNOSIS — Z79899 Other long term (current) drug therapy: Secondary | ICD-10-CM | POA: Diagnosis not present

## 2021-04-10 DIAGNOSIS — M5030 Other cervical disc degeneration, unspecified cervical region: Secondary | ICD-10-CM

## 2021-04-10 DIAGNOSIS — Z8659 Personal history of other mental and behavioral disorders: Secondary | ICD-10-CM

## 2021-04-10 DIAGNOSIS — G252 Other specified forms of tremor: Secondary | ICD-10-CM

## 2021-04-10 DIAGNOSIS — G8929 Other chronic pain: Secondary | ICD-10-CM

## 2021-04-10 DIAGNOSIS — Z9049 Acquired absence of other specified parts of digestive tract: Secondary | ICD-10-CM

## 2021-04-10 DIAGNOSIS — M19072 Primary osteoarthritis, left ankle and foot: Secondary | ICD-10-CM

## 2021-04-10 DIAGNOSIS — M25512 Pain in left shoulder: Secondary | ICD-10-CM

## 2021-04-10 DIAGNOSIS — M170 Bilateral primary osteoarthritis of knee: Secondary | ICD-10-CM

## 2021-04-10 NOTE — Telephone Encounter (Signed)
Next Visit: 04/10/2021 ?  ?Last Visit: 11/06/2020 ?  ?Last Fill: 01/29/2021 ? ?LJ:QGBEE ulcerative colitis without complication  ?  ?Current Dose per office note 11/06/2020: Humira 40 mg sq injections every 14 days ? ?Labs: 01/01/2021  CBC and CMP WNL ? ?TB Gold: 09/20/2020 Neg   ?   ?Okay to refill Humira?  ?

## 2021-04-10 NOTE — Patient Instructions (Addendum)
Standing Labs ?We placed an order today for your standing lab work.  ? ?Please have your standing labs drawn in June and every 3 months ? ?If possible, please have your labs drawn 2 weeks prior to your appointment so that the provider can discuss your results at your appointment. ? ?Please note that you may see your imaging and lab results in [facility] before we have reviewed them. ?We may be awaiting multiple results to interpret others before contacting you. ?Please allow our office up to 72 hours to thoroughly review all of the results before contacting the office for clarification of your results. ? ?We have open lab daily: ?Monday through Thursday from 1:30-4:30 PM and Friday from 1:30-4:00 PM ?at the office of Dr. Bo Merino, [facility].   ?Please be advised, all patients with office appointments requiring lab work will take precedent over walk-in lab work.  ?If possible, please come for your lab work on Monday and Friday afternoons, as you may experience shorter wait times. ?The office is located at [address], [place], [place], [place] [address] ?No appointment is necessary.   ?Labs are drawn by [facility]. Please bring your co-pay at the time of your lab draw.  You may receive a bill from [facility] for your lab work. ? ?Please note if you are on Hydroxychloroquine and and an order has been placed for a Hydroxychloroquine level, you will need to have it drawn 4 hours or more after your last dose. ? ?If you wish to have your labs drawn at another location, please call the office 24 hours in advance to send orders. ? ?If you have any questions regarding directions or hours of operation,  ?please call [phone].   ?As a reminder, please drink plenty of water prior to coming for your lab work. Thanks!  ? ?Vaccines ?You are taking a medication(s) that can suppress your immune system.  The following immunizations are recommended: ?Flu annually ?Covid-19  ?Td/Tdap (tetanus, diphtheria, pertussis)  every 10 years ?Pneumonia (Prevnar 15 then Pneumovax 23 at least 1 year apart.  Alternatively, can take Prevnar 20 without needing additional dose) ?Shingrix: 2 doses from 4 weeks to 6 months apart ? ?Please check with your PCP to make sure you are up to date.  ? ?If you have signs or symptoms of an infection or start antibiotics: ?First, call your PCP for workup of your infection. ?Hold your medication through the infection, until you complete your antibiotics, and until symptoms resolve if you take the following: ?Injectable medication (Actemra, Benlysta, Cimzia, Cosentyx, Enbrel, Humira, Kevzara, Orencia, Remicade, Simponi, [place], [place], [place]) ?Methotrexate ?Leflunomide Jolee Ewing) ?Mycophenolate (Cellcept) ?Roma Kayser, or Rinvoq  ? ?Please get annual skin examination by dermatologist to screen for skin cancer while you are on Humira. ? ?Knee Exercises ?Ask your health care provider which exercises are safe for you. Do exercises exactly as told by your health care provider and adjust them as directed. It is normal to feel mild stretching, pulling, tightness, or discomfort as you do these exercises. Stop right away if you feel sudden pain or your pain gets worse. Do not begin these exercises until told by your health care provider. ?Stretching and range-of-motion exercises ?These exercises warm up your muscles and joints and improve the movement and flexibility of your knee. These exercises also help to relieve pain and swelling. ?Knee extension, prone ? ?Lie on your abdomen (prone position) on a bed. ?Place your left / right knee just beyond the edge of the  surface so your knee is not on the bed. You can put a towel under your left / right thigh just above your kneecap for comfort. ?Relax your leg muscles and allow gravity to straighten your knee (extension). You should feel a stretch behind your left / right knee. ?Hold this position for __________ seconds. ?Scoot up so your knee is supported between  repetitions. ?Repeat __________ times. Complete this exercise __________ times a day. ?Knee flexion, active ? ?Lie on your back with both legs straight. If this causes back discomfort, bend your left / right knee so your foot is flat on the floor. ?Slowly slide your left / right heel back toward your buttocks. Stop when you feel a gentle stretch in the front of your knee or thigh (flexion). ?Hold this position for __________ seconds. ?Slowly slide your left / right heel back to the starting position. ?Repeat __________ times. Complete this exercise __________ times a day. ?Quadriceps stretch, prone ? ?Lie on your abdomen on a firm surface, such as a bed or padded floor. ?Bend your left / right knee and hold your ankle. If you cannot reach your ankle or pant leg, loop a belt around your foot and grab the belt instead. ?Gently pull your heel toward your buttocks. Your knee should not slide out to the side. You should feel a stretch in the front of your thigh and knee (quadriceps). ?Hold this position for __________ seconds. ?Repeat __________ times. Complete this exercise __________ times a day. ?Hamstring, supine ? ?Lie on your back (supine position). ?Loop a belt or towel over the ball of your left / right foot. The ball of your foot is on the walking surface, right under your toes. ?Straighten your left / right knee and slowly pull on the belt to raise your leg until you feel a gentle stretch behind your knee (hamstring). ?Do not let your knee bend while you do this. ?Keep your other leg flat on the floor. ?Hold this position for __________ seconds. ?Repeat __________ times. Complete this exercise __________ times a day. ?Strengthening exercises ?These exercises build strength and endurance in your knee. Endurance is the ability to use your muscles for a long time, even after they get tired. ?Quadriceps, isometric ?This exercise strengthens the muscles in front of your thigh (quadriceps) without moving your knee  joint (isometric). ?Lie on your back with your left / right leg extended and your other knee bent. Put a rolled towel or small pillow under your knee if told by your health care provider. ?Slowly tense the muscles in the front of your left / right thigh. You should see your kneecap slide up toward your hip or see increased dimpling just above the knee. This motion will push the back of the knee toward the floor. ?For __________ seconds, hold the muscle as tight as you can without increasing your pain. ?Relax the muscles slowly and completely. ?Repeat __________ times. Complete this exercise __________ times a day. ?Straight leg raises ?This exercise strengthens the muscles in front of your thigh (quadriceps) and the muscles that move your hips (hip flexors). ?Lie on your back with your left / right leg extended and your other knee bent. ?Tense the muscles in the front of your left / right thigh. You should see your kneecap slide up or see increased dimpling just above the knee. Your thigh may even shake a bit. ?Keep these muscles tight as you raise your leg 4-6 inches (10-15 cm) off the floor. Do not let  your knee bend. ?Hold this position for __________ seconds. ?Keep these muscles tense as you lower your leg. ?Relax your muscles slowly and completely after each repetition. ?Repeat __________ times. Complete this exercise __________ times a day. ?Hamstring, isometric ? ?Lie on your back on a firm surface. ?Bend your left / right knee about __________ degrees. ?Dig your left / right heel into the surface as if you are trying to pull it toward your buttocks. Tighten the muscles in the back of your thighs (hamstring) to "dig" as hard as you can without increasing any pain. ?Hold this position for __________ seconds. ?Release the tension gradually and allow your muscles to relax completely for __________ seconds after each repetition. ?Repeat __________ times. Complete this exercise __________ times a day. ?Hamstring  curls ?If told by your health care provider, do this exercise while wearing ankle weights. Begin with __________lb / kg weights. Then increase the weight by 1 lb (0.5 kg) increments. Do not wear ankle weigh

## 2021-04-11 LAB — CBC WITH DIFFERENTIAL/PLATELET
Absolute Monocytes: 724 cells/uL (ref 200–950)
Basophils Absolute: 54 cells/uL (ref 0–200)
Basophils Relative: 0.5 %
Eosinophils Absolute: 238 cells/uL (ref 15–500)
Eosinophils Relative: 2.2 %
HCT: 37.8 % (ref 35.0–45.0)
Hemoglobin: 12.3 g/dL (ref 11.7–15.5)
Lymphs Abs: 3985 cells/uL — ABNORMAL HIGH (ref 850–3900)
MCH: 29.8 pg (ref 27.0–33.0)
MCHC: 32.5 g/dL (ref 32.0–36.0)
MCV: 91.5 fL (ref 80.0–100.0)
MPV: 10.3 fL (ref 7.5–12.5)
Monocytes Relative: 6.7 %
Neutro Abs: 5800 cells/uL (ref 1500–7800)
Neutrophils Relative %: 53.7 %
Platelets: 310 10*3/uL (ref 140–400)
RBC: 4.13 10*6/uL (ref 3.80–5.10)
RDW: 13.5 % (ref 11.0–15.0)
Total Lymphocyte: 36.9 %
WBC: 10.8 10*3/uL (ref 3.8–10.8)

## 2021-04-11 LAB — COMPLETE METABOLIC PANEL WITH GFR
AG Ratio: 1.5 (calc) (ref 1.0–2.5)
ALT: 19 U/L (ref 6–29)
AST: 19 U/L (ref 10–35)
Albumin: 4.3 g/dL (ref 3.6–5.1)
Alkaline phosphatase (APISO): 97 U/L (ref 37–153)
BUN: 14 mg/dL (ref 7–25)
CO2: 29 mmol/L (ref 20–32)
Calcium: 9.5 mg/dL (ref 8.6–10.4)
Chloride: 103 mmol/L (ref 98–110)
Creat: 0.8 mg/dL (ref 0.50–1.03)
Globulin: 2.9 g/dL (calc) (ref 1.9–3.7)
Glucose, Bld: 98 mg/dL (ref 65–99)
Potassium: 4 mmol/L (ref 3.5–5.3)
Sodium: 139 mmol/L (ref 135–146)
Total Bilirubin: 0.3 mg/dL (ref 0.2–1.2)
Total Protein: 7.2 g/dL (ref 6.1–8.1)
eGFR: 86 mL/min/{1.73_m2} (ref >=60)

## 2021-04-11 NOTE — Progress Notes (Signed)
CBC and CMP are within normal limits.

## 2021-05-02 ENCOUNTER — Telehealth: Payer: Self-pay | Admitting: Pharmacist

## 2021-05-02 NOTE — Telephone Encounter (Signed)
Submitted a Prior Authorization RENEWAL request to [facility] for HUMIRA via CoverMyMeds. Will update once we receive a response. ? ?Key: [id number] ? ?Knox Saliva, PharmD, MPH, BCPS ?Clinical Pharmacist (Rheumatology and Pulmonology) ? ?

## 2021-05-06 NOTE — Telephone Encounter (Signed)
Received notification from [facility] regarding a prior authorization for [place]. Authorization has been APPROVED from 05/02/21 to 05/01/22.  ? ?Patient can continue to fill through [facility]: [phone] ? ?Knox Saliva, PharmD, MPH, BCPS ?Clinical Pharmacist (Rheumatology and Pulmonology) ? ?

## 2021-05-06 NOTE — Telephone Encounter (Signed)
Received notification from [facility] regarding a prior authorization for [place]. Authorization has been APPROVED from 05/02/2021 to 05/01/2022.   ? ?Authorization # [id number] ?

## 2021-05-11 ENCOUNTER — Other Ambulatory Visit: Payer: Self-pay | Admitting: Rheumatology

## 2021-05-13 NOTE — Telephone Encounter (Signed)
Next Visit: 09/10/2021 ? ?Last Visit: 04/10/2021 ? ?Last Fill: 03/08/2021 ? ?DX: Other ulcerative colitis without complication  ? ?Current Dose per office note 04/10/2021: methotrexate 4 tablets every 7 days ? ?Labs: 04/10/2021 CBC and CMP are within normal limits. ? ?Okay to refill MTX?  ?

## 2021-06-21 ENCOUNTER — Other Ambulatory Visit: Payer: Self-pay | Admitting: *Deleted

## 2021-06-21 DIAGNOSIS — Z79899 Other long term (current) drug therapy: Secondary | ICD-10-CM

## 2021-06-22 LAB — COMPLETE METABOLIC PANEL WITH GFR
AG Ratio: 1.4 (calc) (ref 1.0–2.5)
ALT: 18 U/L (ref 6–29)
AST: 17 U/L (ref 10–35)
Albumin: 4.3 g/dL (ref 3.6–5.1)
Alkaline phosphatase (APISO): 105 U/L (ref 37–153)
BUN: 13 mg/dL (ref 7–25)
CO2: 27 mmol/L (ref 20–32)
Calcium: 9.9 mg/dL (ref 8.6–10.4)
Chloride: 102 mmol/L (ref 98–110)
Creat: 0.81 mg/dL (ref 0.50–1.03)
Globulin: 3 g/dL (calc) (ref 1.9–3.7)
Glucose, Bld: 114 mg/dL — ABNORMAL HIGH (ref 65–99)
Potassium: 3.8 mmol/L (ref 3.5–5.3)
Sodium: 139 mmol/L (ref 135–146)
Total Bilirubin: 0.3 mg/dL (ref 0.2–1.2)
Total Protein: 7.3 g/dL (ref 6.1–8.1)
eGFR: 85 mL/min/{1.73_m2} (ref >=60)

## 2021-06-22 LAB — CBC WITH DIFFERENTIAL/PLATELET
Absolute Monocytes: 844 cells/uL (ref 200–950)
Basophils Absolute: 49 cells/uL (ref 0–200)
Basophils Relative: 0.5 %
Eosinophils Absolute: 223 cells/uL (ref 15–500)
Eosinophils Relative: 2.3 %
HCT: 38.1 % (ref 35.0–45.0)
Hemoglobin: 12.8 g/dL (ref 11.7–15.5)
Lymphs Abs: 3327 cells/uL (ref 850–3900)
MCH: 31.1 pg (ref 27.0–33.0)
MCHC: 33.6 g/dL (ref 32.0–36.0)
MCV: 92.7 fL (ref 80.0–100.0)
MPV: 10.3 fL (ref 7.5–12.5)
Monocytes Relative: 8.7 %
Neutro Abs: 5257 cells/uL (ref 1500–7800)
Neutrophils Relative %: 54.2 %
Platelets: 305 10*3/uL (ref 140–400)
RBC: 4.11 10*6/uL (ref 3.80–5.10)
RDW: 13.6 % (ref 11.0–15.0)
Total Lymphocyte: 34.3 %
WBC: 9.7 10*3/uL (ref 3.8–10.8)

## 2021-06-22 NOTE — Progress Notes (Signed)
CBC and CMP are normal.  Glucose is mildly elevated, probably not a fasting sample.

## 2021-07-12 ENCOUNTER — Other Ambulatory Visit: Payer: Self-pay | Admitting: Rheumatology

## 2021-07-12 DIAGNOSIS — K5180 Other ulcerative colitis without complications: Secondary | ICD-10-CM

## 2021-07-12 NOTE — Telephone Encounter (Signed)
Next Visit: 09/10/2021   Last Visit: 04/10/2021   Last Fill: 04/10/2021  DX: Other ulcerative colitis without complication    Current Dose per office note 04/10/2021: Humira 40 mg sq injections every 14 days  Labs: 06/21/2021 CBC and CMP are normal.  Glucose is mildly elevated, probably not a fasting sample.  TB Gold: 09/20/2020 Neg    Okay to refill Humira?

## 2021-07-19 ENCOUNTER — Other Ambulatory Visit: Payer: Self-pay | Admitting: Physician Assistant

## 2021-08-23 ENCOUNTER — Other Ambulatory Visit: Payer: Self-pay | Admitting: *Deleted

## 2021-08-23 MED ORDER — METHOTREXATE SODIUM 2.5 MG PO TABS
ORAL_TABLET | ORAL | 0 refills | Status: DC
Start: 2021-08-23 — End: 2021-10-29

## 2021-08-23 NOTE — Telephone Encounter (Signed)
Patient called requesting a refill on MTX to be sent to [facility].   Next Visit: 09/10/2021  Last Visit: 04/10/2021  Last Fill: 05/13/2021  DX: Other ulcerative colitis without complication   Current Dose per office note 05/11/2021: methotrexate 4 tablets every 7 days  Labs: 07/17/2021 Glucose 113, MCHC 32.7   Okay to refill MTX?

## 2021-08-28 NOTE — Progress Notes (Unsigned)
Office Visit Note  Patient: Mercedes [name]             Date of Birth: 07/28/1965           MRN: [id number]             PCP: Manfred Shirts, PA Referring: Manfred Shirts, PA Visit Date: 09/10/2021 Occupation: @GUAROCC @  Subjective:  Medication monitoring   History of Present Illness: Mercedes [name] is a 56 y.o. female with history of ulcerative colitis, osteoarthritis, and DDD.  She remains on Humira 40 mg sq injections every 14 days, methotrexate 4 tablets every 7 days, and folic acid 1 mg daily.  She is tolerating Humira and methotrexate without any side effects and has not missed any doses recently.  She denies any signs or symptoms of an ulcerative colitis flare.  She has been seeing her gastroenterologist every 2 years.  She has intermittent pain and stiffness in both hands and both feet and but denies any joint swelling.  She was evaluated emerge orthopedics for discomfort in the left knee joint intermittently.  She states that she was told that she has a meniscal tear.  She is planning on having a left knee joint cortisone injection if her mechanical symptoms or joint pain return.  She states in the future she will require surgical repair but is not ready to proceed with surgery at this time.  She states that she continues to experience discomfort in her lower back especially after walking for prolonged periods of time.  She denies any symptoms of radiculopathy at this time.  She denies any other joint pain or joint swelling currently.  She has been taking Tylenol very sparingly.  She has not been taking prednisone recently. She denies any recent or recurrent infections.  She denies any new medical conditions.     Activities of Daily Living:  Patient reports morning stiffness for 30 minutes.   Patient Denies nocturnal pain.  Difficulty dressing/grooming: Denies Difficulty climbing stairs: Reports Difficulty getting out of chair: Reports Difficulty using hands for taps, buttons, cutlery,  and/or writing: Denies  Review of Systems  Constitutional:  Positive for fatigue.  HENT:  Negative for mouth sores and mouth dryness.   Eyes:  Negative for dryness.  Respiratory:  Positive for shortness of breath.   Cardiovascular:  Negative for chest pain and palpitations.  Gastrointestinal:  Negative for blood in stool, constipation and diarrhea.  Endocrine: Negative for increased urination.  Genitourinary:  Negative for involuntary urination.  Musculoskeletal:  Positive for joint pain, joint pain, joint swelling and morning stiffness. Negative for gait problem, myalgias, muscle weakness, muscle tenderness and myalgias.  Skin:  Negative for color change, rash, hair loss and sensitivity to sunlight.  Allergic/Immunologic: Negative for susceptible to infections.  Neurological:  Negative for dizziness and headaches.  Hematological:  Negative for swollen glands.  Psychiatric/Behavioral:  Negative for depressed mood and sleep disturbance. The patient is not nervous/anxious.     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   History of cholecystectomy 07/30/2016   Diabetes mellitus ([place]) 01/29/2016   Impetigo 01/09/2016   Anniversary reaction 12/20/2015   Sacroiliitis ([place]) 12/19/2015   Primary osteoarthritis of both knees 12/19/2015   Primary osteoarthritis of both feet 12/19/2015   Depression 12/19/2015    Peptic ulcer disease 12/19/2015   Hemorrhoids 12/19/2015   S/p bilateral carpal tunnel release 12/19/2015   Dyslipidemia 12/19/2015   Intermittent asthma without complication 50/03/7046   Environmental allergies 12/19/2015   Attention deficit hyperactivity disorder (ADHD) 12/19/2015   Ulcerative colitis ([place]) 12/18/2015   High risk medication use 12/18/2015   Need for hepatitis C screening test 07/12/2015   Need for prophylactic vaccination against Streptococcus pneumoniae (pneumococcus) 02/07/2015   Type 2 diabetes mellitus without complication, without long-term current use of insulin ([place]) 02/07/2015   Anemia 06/13/2013   Injury of hand, right 06/13/2013   Prediabetes 06/13/2013   Anxiety state 12/14/2012   Hyperlipidemia 12/14/2012   Hypertension, benign 12/14/2012   Breast mass 07/09/2012   Herpes zoster 06/19/2012   Abnormal mammogram 06/09/2012    Past Medical History:  Diagnosis Date   Arthritis    Asthma    High cholesterol    Hypertension    Pre-diabetes    Seasonal allergies     Family History  Problem Relation Age of Onset   Diabetes Mother    Hypertension Mother    Hyperlipidemia Mother    Dementia Mother    COPD Father    Arthritis Father    Thyroid disease Sister    Thyroid disease Sister    Cancer Sister        blood cancer   Asthma Sister    ADD / ADHD Son    Down syndrome Daughter    Past Surgical History:  Procedure Laterality Date   CARPAL TUNNEL RELEASE  unsure   bilaterally   CESAREAN SECTION  12/01   CHOLECYSTECTOMY  12/01   PARTIAL MASTECTOMY WITH NEEDLE LOCALIZATION Right 07/09/2012   Procedure: RIGHT PARTIAL MASTECTOMY WITH NEEDLE LOCALIZATION;  Surgeon: Adin Hector, MD;  Location: [facility];  Service: General;  Laterality: Right;   SHOULDER OPEN ROTATOR CUFF REPAIR  2009   rt shoulder   TOTAL SHOULDER ARTHROPLASTY     Social History   Social History Narrative   Not on file   Immunization History   Administered Date(s) Administered   Influenza Split 09/14/2010, 11/09/2013, 10/14/2014, 10/10/2015, 10/09/2016, 11/10/2017   Influenza, Seasonal, Injecte, Preservative Fre 09/14/2010, 10/09/2016   Influenza,inj,Quad PF,6+ Mos 10/10/2015, 11/10/2017   Influenza,inj,Quad PF,6-35 Mos 01/12/2019   Influenza,inj,quad, With Preservative 11/10/2017   Influenza-Unspecified 11/09/2013, 10/14/2014   PFIZER(Purple Top)SARS-COV-2 Vaccination 03/17/2019, 04/13/2019, 11/12/2019   Pneumococcal Conjugate-13 02/07/2015   Pneumococcal Polysaccharide-23 09/06/2013     Objective: Vital Signs: BP 110/77 (BP Location: Left Arm, Patient Position: Sitting, Cuff Size: Normal)   Pulse 76   Resp 17   Ht 5' 5"  (1.651 m)   Wt 228 lb 6.4 oz (103.6 kg)   LMP 11/05/2011   BMI 38.01 kg/m    Physical Exam Vitals and nursing note reviewed.  Constitutional:      Appearance: She is well-developed.  HENT:     Head: Normocephalic and atraumatic.  Eyes:     Conjunctiva/sclera: Conjunctivae normal.  Cardiovascular:     Rate and Rhythm: Normal rate and regular rhythm.  Heart sounds: Normal heart sounds.  Pulmonary:     Effort: Pulmonary effort is normal.     Breath sounds: Normal breath sounds.  Abdominal:     General: Bowel sounds are normal.     Palpations: Abdomen is soft.  Musculoskeletal:     Cervical back: Normal range of motion.  Skin:    General: Skin is warm and dry.     Capillary Refill: Capillary refill takes less than 2 seconds.  Neurological:     Mental Status: She is alert and oriented to person, place, and time.  Psychiatric:        Behavior: Behavior normal.      Musculoskeletal Exam: C-spine has good range of motion with no discomfort.  No midline spinal tenderness.  Some tenderness over both SI joints.  Shoulder joints, elbow joints, wrist joints, MCPs, PIPs, DIPs have good range of motion with no synovitis.  She has PIP and DIP thickening consistent with osteoarthritis of both hands.   Complete fist formation bilaterally.  Hip joints have good range of motion with no groin pain.  Knee joints have good range of motion with mild warmth of the left knee but no effusion noted.  Ankle joints have good range of motion with no tenderness or joint swelling.  No evidence of Achilles tendinitis or plantar fasciitis.  No tenderness or synovitis of MTP joints.  CDAI Exam: CDAI Score: -- Patient Global: --; Provider Global: -- Swollen: --; Tender: -- Joint Exam 09/10/2021   No joint exam has been documented for this visit   There is currently no information documented on the homunculus. Go to the Rheumatology activity and complete the homunculus joint exam.  Investigation: No additional findings.  Imaging: No results found.  Recent Labs: Lab Results  Component Value Date   WBC 9.7 06/21/2021   HGB 12.8 06/21/2021   PLT 305 06/21/2021   NA 139 06/21/2021   K 3.8 06/21/2021   CL 102 06/21/2021   CO2 27 06/21/2021   GLUCOSE 114 (H) 06/21/2021   BUN 13 06/21/2021   CREATININE 0.81 06/21/2021   BILITOT 0.3 06/21/2021   ALKPHOS 97 07/30/2016   AST 17 06/21/2021   ALT 18 06/21/2021   PROT 7.3 06/21/2021   ALBUMIN 4.1 07/30/2016   CALCIUM 9.9 06/21/2021   GFRAA 80 04/25/2020   QFTBGOLDPLUS NEGATIVE 09/20/2020    Speciality Comments: No specialty comments available.  Procedures:  No procedures performed Allergies: Other, Tape, and Tree extract   Assessment / Plan:     Visit Diagnoses: Other ulcerative colitis without complication ([place]) - Patient is followed by Dr. Collene Mares.  She had a routine colonoscopy on 03/05/2020: She has not had any signs or symptoms of an ulcerative colitis flare.  She has clinically been doing well on Humira 40 mg sq injections once every 14 days and methotrexate 4 tablets by mouth once weekly.  She is tolerating combination therapy and has not missed any doses. She has no signs of inflammatory arthritis at this time.  She has occasional discomfort  in both hands and both feet but has no active synovitis or dactylitis.  No achilles tendonitis or plantar fasciitis.  She experiences intermittent SI joint pain, which is exacerbated by walking prolonged distances.   She will remain on the current treatment regimen.  She was advised to notify us if she develops any signs or symptoms of a flare.  She will follow up in 5 months.     High risk medication use -  Humira 40 mg sq injections every 14 days, methotrexate 4 tablets every 7 days, and folic acid 1 mg daily. - Plan: QuantiFERON-TB Gold Plus, QuantiFERON-TB Gold Plus CBC and CMP drawn on 07/17/2021.  Her next lab work will be due in October and every 3 months to monitor for drug toxicity.  Standing orders for CBC and CMP remain in place.   TB Gold negative on 09/20/2020.  Order for TB Gold placed today. No recent or recurrent infections.  Discussed the importance of holding Humira and methotrexate if she develops signs or symptoms of an infection and to resume once the infection is completely cleared. Discussed the importance of yearly skin examinations while on Humira due to the increased risk for skin cancer.  She is scheduled for an upcoming dermatology appointment in September 2023. She is planning on getting the annual flu shot.  Discussed also scheduling the Shingrix vaccine series.  Screening for tuberculosis - Order for TB gold released today. Plan: QuantiFERON-TB Gold Plus, QuantiFERON-TB Gold Plus  Sacroiliitis ([place]): She has intermittent discomfort in both SI joints.  Her discomfort is exacerbated by walking prolonged distances.   Chronic pain of both shoulders - Status post right shoulder surgery by Dr. Amedeo Plenty. She has good ROM of both shoulder joints with no discomfort.   Primary osteoarthritis of both knees - Bilateral mild osteoarthritis and chondromalacia patella. She has good ROM of both knee joints with no discomfort.  No warmth or effusion noted.   Primary osteoarthritis of both  feet: She is not experiencing any discomfort in her feet at this time.  She has occasional discomfort after walking for long distances.  No inflammation noted.  S/p bilateral carpal tunnel release - Performed by Dr. Amedeo Plenty.  Asymptomatic at this time.   DDD (degenerative disc disease), cervical - Followed by Dr. Ramos-performed injection in the past.  No symptoms of radiculopathy at this time.   Other medical conditions are listed as follows:   History of peptic ulcer disease  History of cholecystectomy  History of asthma - Related to allergies.  Coarse tremors  History of attention deficit disorder    Orders: Orders Placed This Encounter  Procedures   QuantiFERON-TB Gold Plus   No orders of the defined types were placed in this encounter.    Follow-Up Instructions: Return in about 5 months (around 02/10/2022) for UC, OA, DDD.   Ofilia Neas, PA-C  Note - This record has been created using Dragon software.  Chart creation errors have been sought, but may not always  have been located. Such creation errors do not reflect on  the standard of medical care.

## 2021-09-10 ENCOUNTER — Ambulatory Visit: Payer: BC Managed Care – PPO | Attending: Physician Assistant | Admitting: Physician Assistant

## 2021-09-10 ENCOUNTER — Encounter: Payer: Self-pay | Admitting: Physician Assistant

## 2021-09-10 VITALS — BP 110/77 | HR 76 | Resp 17 | Ht 65.0 in | Wt 228.4 lb

## 2021-09-10 DIAGNOSIS — M170 Bilateral primary osteoarthritis of knee: Secondary | ICD-10-CM

## 2021-09-10 DIAGNOSIS — G252 Other specified forms of tremor: Secondary | ICD-10-CM

## 2021-09-10 DIAGNOSIS — Z8659 Personal history of other mental and behavioral disorders: Secondary | ICD-10-CM

## 2021-09-10 DIAGNOSIS — Z111 Encounter for screening for respiratory tuberculosis: Secondary | ICD-10-CM

## 2021-09-10 DIAGNOSIS — M461 Sacroiliitis, not elsewhere classified: Secondary | ICD-10-CM

## 2021-09-10 DIAGNOSIS — K5180 Other ulcerative colitis without complications: Secondary | ICD-10-CM

## 2021-09-10 DIAGNOSIS — Z8711 Personal history of peptic ulcer disease: Secondary | ICD-10-CM

## 2021-09-10 DIAGNOSIS — Z79899 Other long term (current) drug therapy: Secondary | ICD-10-CM | POA: Diagnosis not present

## 2021-09-10 DIAGNOSIS — G8929 Other chronic pain: Secondary | ICD-10-CM

## 2021-09-10 DIAGNOSIS — M19071 Primary osteoarthritis, right ankle and foot: Secondary | ICD-10-CM

## 2021-09-10 DIAGNOSIS — M5030 Other cervical disc degeneration, unspecified cervical region: Secondary | ICD-10-CM

## 2021-09-10 DIAGNOSIS — M25511 Pain in right shoulder: Secondary | ICD-10-CM

## 2021-09-10 DIAGNOSIS — M25512 Pain in left shoulder: Secondary | ICD-10-CM

## 2021-09-10 DIAGNOSIS — M19072 Primary osteoarthritis, left ankle and foot: Secondary | ICD-10-CM

## 2021-09-10 DIAGNOSIS — Z9049 Acquired absence of other specified parts of digestive tract: Secondary | ICD-10-CM

## 2021-09-10 DIAGNOSIS — Z9889 Other specified postprocedural states: Secondary | ICD-10-CM

## 2021-09-10 DIAGNOSIS — Z8709 Personal history of other diseases of the respiratory system: Secondary | ICD-10-CM

## 2021-09-10 NOTE — Patient Instructions (Addendum)
Standing Labs We placed an order today for your standing lab work.   Please have your standing labs drawn in October and every 3 months    If possible, please have your labs drawn 2 weeks prior to your appointment so that the provider can discuss your results at your appointment.  Please note that you may see your imaging and lab results in [facility] before we have reviewed them. We may be awaiting multiple results to interpret others before contacting you. Please allow our office up to 72 hours to thoroughly review all of the results before contacting the office for clarification of your results.  We currently have open lab daily: Monday through Thursday from 1:30 PM-4:30 PM and Friday from 1:30 PM- 4:00 PM If possible, please come for your lab work on Monday, Thursday or Friday afternoons, as you may experience shorter wait times.   Effective November 13, 2021 the new lab hours will change to: Monday through Thursday from 1:30 PM-5:00 PM and Friday from 8:30 AM-12:00 PM If possible, please come for your lab work on Monday and Thursday afternoons, as you may experience shorter wait times.  Please be advised, all patients with office appointments requiring lab work will take precedent over walk-in lab work.    The office is located at [address] [place], [place], [place] [address] No appointment is necessary.   Labs are drawn by [facility]. Please bring your co-pay at the time of your lab draw.  You may receive a bill from [facility] for your lab work.  Please note if you are on Hydroxychloroquine and and an order has been placed for a Hydroxychloroquine level, you will need to have it drawn 4 hours or more after your last dose.  If you wish to have your labs drawn at another location, please call the office 24 hours in advance to send orders.  If you have any questions regarding directions or hours of operation,  please call [phone].   As a reminder, please drink plenty of water prior  to coming for your lab work. Thanks! .  If you have signs or symptoms of an infection or start antibiotics: First, call your PCP for workup of your infection. Hold your medication through the infection, until you complete your antibiotics, and until symptoms resolve if you take the following: Injectable medication (Actemra, Benlysta, Cimzia, Cosentyx, Enbrel, Humira, Kevzara, Orencia, Remicade, Simponi, Stelara, Taltz, Tremfya) Methotrexate Leflunomide (Arava) Mycophenolate (Cellcept) Morrie Sheldon, Olumiant, or Rinvoq Vaccines You are taking a medication(s) that can suppress your immune system.  The following immunizations are recommended: Flu annually Covid-19  Td/Tdap (tetanus, diphtheria, pertussis) every 10 years Pneumonia (Prevnar 15 then Pneumovax 23 at least 1 year apart.  Alternatively, can take Prevnar 20 without needing additional dose) Shingrix: 2 doses from 4 weeks to 6 months apart  Please check with your PCP to make sure you are up to date.

## 2021-09-13 LAB — QUANTIFERON-TB GOLD PLUS
Mitogen-NIL: >10 IU/mL
NIL: 0.03 IU/mL
QuantiFERON-TB Gold Plus: NEGATIVE
TB1-NIL: <0 IU/mL
TB2-NIL: 0.01 IU/mL

## 2021-09-17 NOTE — Progress Notes (Signed)
TB gold negative

## 2021-09-30 ENCOUNTER — Other Ambulatory Visit: Payer: Self-pay | Admitting: Physician Assistant

## 2021-09-30 DIAGNOSIS — K5180 Other ulcerative colitis without complications: Secondary | ICD-10-CM

## 2021-09-30 NOTE — Telephone Encounter (Signed)
Next Visit: 02/13/2022  Last Visit: 09/10/2021  Last Fill: 07/12/2021  DX:Other ulcerative colitis without complication   Current Dose per office note 09/10/2021: Humira 40 mg sq injections every 14 days  Labs: 07/17/2021 MCHC 32.7, Glucose 113  TB Gold: 09/10/2021 Neg    Okay to refill Humira?

## 2021-10-28 ENCOUNTER — Other Ambulatory Visit: Payer: Self-pay

## 2021-10-28 DIAGNOSIS — Z79899 Other long term (current) drug therapy: Secondary | ICD-10-CM

## 2021-10-29 ENCOUNTER — Other Ambulatory Visit: Payer: Self-pay | Admitting: Rheumatology

## 2021-10-29 LAB — COMPLETE METABOLIC PANEL WITH GFR
AG Ratio: 1.3 (calc) (ref 1.0–2.5)
ALT: 21 U/L (ref 6–29)
AST: 18 U/L (ref 10–35)
Albumin: 4.3 g/dL (ref 3.6–5.1)
Alkaline phosphatase (APISO): 106 U/L (ref 37–153)
BUN: 16 mg/dL (ref 7–25)
CO2: 28 mmol/L (ref 20–32)
Calcium: 9.9 mg/dL (ref 8.6–10.4)
Chloride: 100 mmol/L (ref 98–110)
Creat: 0.94 mg/dL (ref 0.50–1.03)
Globulin: 3.3 g/dL (calc) (ref 1.9–3.7)
Glucose, Bld: 88 mg/dL (ref 65–99)
Potassium: 4.1 mmol/L (ref 3.5–5.3)
Sodium: 139 mmol/L (ref 135–146)
Total Bilirubin: 0.3 mg/dL (ref 0.2–1.2)
Total Protein: 7.6 g/dL (ref 6.1–8.1)
eGFR: 70 mL/min/{1.73_m2} (ref >=60)

## 2021-10-29 LAB — CBC WITH DIFFERENTIAL/PLATELET
Absolute Monocytes: 839 cells/uL (ref 200–950)
Basophils Absolute: 44 cells/uL (ref 0–200)
Basophils Relative: 0.4 %
Eosinophils Absolute: 185 cells/uL (ref 15–500)
Eosinophils Relative: 1.7 %
HCT: 39.2 % (ref 35.0–45.0)
Hemoglobin: 13.2 g/dL (ref 11.7–15.5)
Lymphs Abs: 3837 cells/uL (ref 850–3900)
MCH: 31 pg (ref 27.0–33.0)
MCHC: 33.7 g/dL (ref 32.0–36.0)
MCV: 92 fL (ref 80.0–100.0)
MPV: 10.2 fL (ref 7.5–12.5)
Monocytes Relative: 7.7 %
Neutro Abs: 5995 cells/uL (ref 1500–7800)
Neutrophils Relative %: 55 %
Platelets: 346 10*3/uL (ref 140–400)
RBC: 4.26 10*6/uL (ref 3.80–5.10)
RDW: 13.2 % (ref 11.0–15.0)
Total Lymphocyte: 35.2 %
WBC: 10.9 10*3/uL — ABNORMAL HIGH (ref 3.8–10.8)

## 2021-10-29 NOTE — Progress Notes (Signed)
White cell count is mildly elevated.  CMP is normal.

## 2021-10-29 NOTE — Telephone Encounter (Signed)
Next Visit: 02/13/2022  Last Visit: 09/10/2021  Last Fill:  08/23/2021  DX: Other ulcerative colitis without complication   Current Dose per office note 09/10/2021: methotrexate 4 tablets every 7 days  Labs: 10/28/2021 White cell count is mildly elevated.  CMP is normal.  Okay to refill MTX?

## 2021-11-20 HISTORY — PX: MENISCUS REPAIR: SHX5179

## 2021-12-04 ENCOUNTER — Other Ambulatory Visit: Payer: Self-pay | Admitting: Physician Assistant

## 2021-12-23 ENCOUNTER — Other Ambulatory Visit: Payer: Self-pay | Admitting: Physician Assistant

## 2021-12-23 DIAGNOSIS — K5180 Other ulcerative colitis without complications: Secondary | ICD-10-CM

## 2021-12-23 NOTE — Telephone Encounter (Signed)
Next Visit: 02/13/2022   Last Visit: 09/10/2021   Last Fill: 09/30/2021  DX: Other ulcerative colitis without complication    Current Dose per office note 09/10/2021:  Humira 40 mg sq injections every 14 days   Labs: 11/11/2021 RBC 3.98  Tb Gold: 09/10/2021 Neg   Okay to refill Humira?

## 2022-01-04 ENCOUNTER — Other Ambulatory Visit: Payer: Self-pay | Admitting: Rheumatology

## 2022-01-08 NOTE — Telephone Encounter (Signed)
Next Visit: 02/13/2022  Last Visit: 09/10/2021  Last Fill: 10/29/2021   DX: Other ulcerative colitis without complication   Current Dose per office note 09/10/2021: methotrexate 4 tablets every 7 days   Labs: 11/11/2021 RBC 3.98,   Okay to refill MTX?

## 2022-02-03 NOTE — Progress Notes (Deleted)
Office Visit Note  Patient: Mercedes [name]             Date of Birth: 27-Feb-1965           MRN: [id number]             PCP: Manfred Shirts, PA Referring: Manfred Shirts, PA Visit Date: 02/13/2022 Occupation: '@GUAROCC'$ @  Subjective:  No chief complaint on file.   History of Present Illness: Mercedes [name] is a 57 y.o. female ***     Activities of Daily Living:  Patient reports morning stiffness for *** {minute/hour:19697}.   Patient {ACTIONS;DENIES/REPORTS:21021675::"Denies"} nocturnal pain.  Difficulty dressing/grooming: {ACTIONS;DENIES/REPORTS:21021675::"Denies"} Difficulty climbing stairs: {ACTIONS;DENIES/REPORTS:21021675::"Denies"} Difficulty getting out of chair: {ACTIONS;DENIES/REPORTS:21021675::"Denies"} Difficulty using hands for taps, buttons, cutlery, and/or writing: {ACTIONS;DENIES/REPORTS:21021675::"Denies"}  No Rheumatology ROS completed.   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   History of cholecystectomy 07/30/2016   Diabetes mellitus ([place]) 01/29/2016   Impetigo 01/09/2016   Anniversary reaction 12/20/2015   Sacroiliitis ([place]) 12/19/2015   Primary osteoarthritis of both knees 12/19/2015   Primary osteoarthritis of both feet 12/19/2015   Depression 12/19/2015   Peptic ulcer disease 12/19/2015   Hemorrhoids 12/19/2015   S/p bilateral carpal tunnel release 12/19/2015   Dyslipidemia  12/19/2015   Intermittent asthma without complication [id number]   Environmental allergies 12/19/2015   Attention deficit hyperactivity disorder (ADHD) 12/19/2015   Ulcerative colitis ([place]) 12/18/2015   High risk medication use 12/18/2015   Need for hepatitis C screening test 07/12/2015   Need for prophylactic vaccination against Streptococcus pneumoniae (pneumococcus) 02/07/2015   Type 2 diabetes mellitus without complication, without long-term current use of insulin ([place]) 02/07/2015   Anemia 06/13/2013   Injury of hand, right 06/13/2013   Prediabetes 06/13/2013   Anxiety state 12/14/2012   Hyperlipidemia 12/14/2012   Hypertension, benign 12/14/2012   Breast mass 07/09/2012   Herpes zoster 06/19/2012   Abnormal mammogram 06/09/2012    Past Medical History:  Diagnosis Date   Arthritis    Asthma    High cholesterol    Hypertension    Pre-diabetes    Seasonal allergies     Family History  Problem Relation Age of Onset   Diabetes Mother    Hypertension Mother    Hyperlipidemia Mother    Dementia Mother    COPD Father    Arthritis Father    Thyroid disease Sister    Thyroid disease Sister    Cancer Sister        blood cancer   Asthma Sister    ADD / ADHD Son    Down syndrome Daughter    Past Surgical History:  Procedure Laterality Date   CARPAL TUNNEL RELEASE  unsure   bilaterally   CESAREAN SECTION  12/01   CHOLECYSTECTOMY  12/01   PARTIAL MASTECTOMY WITH NEEDLE LOCALIZATION Right 07/09/2012   Procedure: RIGHT PARTIAL MASTECTOMY WITH NEEDLE LOCALIZATION;  Surgeon: Renelda Loma  Alyssa Grove, MD;  Location: [facility];  Service: General;  Laterality: Right;   SHOULDER OPEN ROTATOR CUFF REPAIR  2009   rt shoulder   TOTAL SHOULDER ARTHROPLASTY     Social History   Social History Narrative   Not on file   Immunization History  Administered Date(s) Administered   Influenza Split 09/14/2010, 11/09/2013, 10/14/2014, 10/10/2015, 10/09/2016, 11/10/2017    Influenza, Seasonal, Injecte, Preservative Fre 09/14/2010, 10/09/2016   Influenza,inj,Quad PF,6+ Mos 10/10/2015, 11/10/2017   Influenza,inj,Quad PF,6-35 Mos 01/12/2019   Influenza,inj,quad, With Preservative 11/10/2017   Influenza-Unspecified 11/09/2013, 10/14/2014   PFIZER(Purple Top)SARS-COV-2 Vaccination 03/17/2019, 04/13/2019, 11/12/2019   Pneumococcal Conjugate-13 02/07/2015   Pneumococcal Polysaccharide-23 09/06/2013     Objective: Vital Signs: LMP 11/05/2011    Physical Exam   Musculoskeletal Exam: ***  CDAI Exam: CDAI Score: -- Patient Global: --; Provider Global: -- Swollen: --; Tender: -- Joint Exam 02/13/2022   No joint exam has been documented for this visit   There is currently no information documented on the homunculus. Go to the Rheumatology activity and complete the homunculus joint exam.  Investigation: No additional findings.  Imaging: No results found.  Recent Labs: Lab Results  Component Value Date   WBC 10.9 (H) 10/28/2021   HGB 13.2 10/28/2021   PLT 346 10/28/2021   NA 139 10/28/2021   K 4.1 10/28/2021   CL 100 10/28/2021   CO2 28 10/28/2021   GLUCOSE 88 10/28/2021   BUN 16 10/28/2021   CREATININE 0.94 10/28/2021   BILITOT 0.3 10/28/2021   ALKPHOS 97 07/30/2016   AST 18 10/28/2021   ALT 21 10/28/2021   PROT 7.6 10/28/2021   ALBUMIN 4.1 07/30/2016   CALCIUM 9.9 10/28/2021   GFRAA 80 04/25/2020   QFTBGOLDPLUS NEGATIVE 09/10/2021    Speciality Comments: No specialty comments available.  Procedures:  No procedures performed Allergies: Other, Tape, and Tree extract   Assessment / Plan:     Visit Diagnoses: No diagnosis found.  Orders: No orders of the defined types were placed in this encounter.  No orders of the defined types were placed in this encounter.   Face-to-face time spent with patient was *** minutes. Greater than 50% of time was spent in counseling and coordination of care.  Follow-Up Instructions: No follow-ups  on file.   Earnestine Mealing, CMA  Note - This record has been created using Editor, commissioning.  Chart creation errors have been sought, but may not always  have been located. Such creation errors do not reflect on  the standard of medical care.

## 2022-02-13 ENCOUNTER — Ambulatory Visit: Payer: BC Managed Care – PPO | Admitting: Rheumatology

## 2022-02-13 DIAGNOSIS — Z79899 Other long term (current) drug therapy: Secondary | ICD-10-CM

## 2022-02-13 DIAGNOSIS — M19071 Primary osteoarthritis, right ankle and foot: Secondary | ICD-10-CM

## 2022-02-13 DIAGNOSIS — G8929 Other chronic pain: Secondary | ICD-10-CM

## 2022-02-13 DIAGNOSIS — Z8709 Personal history of other diseases of the respiratory system: Secondary | ICD-10-CM

## 2022-02-13 DIAGNOSIS — M461 Sacroiliitis, not elsewhere classified: Secondary | ICD-10-CM

## 2022-02-13 DIAGNOSIS — M170 Bilateral primary osteoarthritis of knee: Secondary | ICD-10-CM

## 2022-02-13 DIAGNOSIS — Z9049 Acquired absence of other specified parts of digestive tract: Secondary | ICD-10-CM

## 2022-02-13 DIAGNOSIS — M5030 Other cervical disc degeneration, unspecified cervical region: Secondary | ICD-10-CM

## 2022-02-13 DIAGNOSIS — Z8659 Personal history of other mental and behavioral disorders: Secondary | ICD-10-CM

## 2022-02-13 DIAGNOSIS — K5180 Other ulcerative colitis without complications: Secondary | ICD-10-CM

## 2022-02-13 DIAGNOSIS — G252 Other specified forms of tremor: Secondary | ICD-10-CM

## 2022-02-13 DIAGNOSIS — Z9889 Other specified postprocedural states: Secondary | ICD-10-CM

## 2022-02-13 DIAGNOSIS — Z8711 Personal history of peptic ulcer disease: Secondary | ICD-10-CM

## 2022-02-14 NOTE — Progress Notes (Signed)
Office Visit Note  Patient: Mercedes [name]             Date of Birth: 12-14-1965           MRN: [id number]             PCP: Manfred Shirts, PA Referring: Manfred Shirts, PA Visit Date: 02/24/2022 Occupation: @GUAROCC$ @  Subjective:  Medication monitoring  History of Present Illness: Mercedes [name] is a 57 y.o. female with history of ulcerative colitis, sacroiliitis, and osteoarthritis.  Patient remains on Humira 40 mg sq injections every 14 days, methotrexate 4 tablets every 7 days, and folic acid 1 mg daily.  She continues to tolerate Humira and methotrexate without any side effects and has not missed any doses recently.  She denies any recent or recurrent infections.  She denies any signs or symptoms of an ulcerative colitis flare.  She continues to follow-up with gastroenterology every 2 years for updated colonoscopies.  She is also been following up with dermatology on a yearly basis for skin cancer screening while on Humira.  Patient presents today with some increased discomfort in both SI joints as well as some pain in the left thumb in the first MCP joint.  Patient attributes the increased arthralgias to recent weather changes.  She denies any change in activity or repetitive activities this past weekend.  She has been taking Tylenol as needed for pain relief which has been helpful.  She denies any nocturnal pain in her SI joints.  She denies any other joint pain or joint swelling at this time.  She denies any achilles tendonitis or plantar fasciitis.    Activities of Daily Living:  Patient reports morning stiffness for 30 minutes.   Patient Reports nocturnal pain.  Difficulty dressing/grooming: Reports Difficulty climbing stairs: Denies Difficulty getting out of chair: Denies Difficulty using hands for taps, buttons, cutlery, and/or writing: Denies  Review of Systems  Constitutional: Negative.  Negative for fatigue.  HENT: Negative.  Negative for mouth sores and mouth dryness.   Eyes:  Negative.  Negative for dryness.  Respiratory: Negative.  Negative for shortness of breath.   Cardiovascular: Negative.  Negative for chest pain and palpitations.  Gastrointestinal: Negative.  Negative for blood in stool, constipation and diarrhea.  Endocrine: Negative.  Negative for increased urination.  Genitourinary: Negative.  Negative for involuntary urination.  Musculoskeletal:  Positive for joint pain, joint pain, joint swelling, myalgias, muscle weakness, morning stiffness and myalgias. Negative for gait problem and muscle tenderness.  Skin: Negative.  Negative for color change, rash, hair loss and sensitivity to sunlight.  Allergic/Immunologic: Negative.  Negative for susceptible to infections.  Neurological: Negative.  Negative for dizziness and headaches.  Hematological: Negative.  Negative for swollen glands.  Psychiatric/Behavioral:  Positive for sleep disturbance. Negative for depressed mood. The patient is not nervous/anxious.     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  History of cholecystectomy 07/30/2016   Diabetes mellitus ([place]) 01/29/2016   Impetigo 01/09/2016   Anniversary reaction 12/20/2015   Sacroiliitis ([place]) 12/19/2015   Primary osteoarthritis of both knees 12/19/2015   Primary osteoarthritis of both feet 12/19/2015   Depression 12/19/2015   Peptic ulcer disease 12/19/2015    Hemorrhoids 12/19/2015   S/p bilateral carpal tunnel release 12/19/2015   Dyslipidemia 12/19/2015   Intermittent asthma without complication [id number]   Environmental allergies 12/19/2015   Attention deficit hyperactivity disorder (ADHD) 12/19/2015   Ulcerative colitis ([place]) 12/18/2015   High risk medication use 12/18/2015   Need for hepatitis C screening test 07/12/2015   Need for prophylactic vaccination against Streptococcus pneumoniae (pneumococcus) 02/07/2015   Type 2 diabetes mellitus without complication, without long-term current use of insulin ([place]) 02/07/2015   Anemia 06/13/2013   Injury of hand, right 06/13/2013   Prediabetes 06/13/2013   Anxiety state 12/14/2012   Hyperlipidemia 12/14/2012   Hypertension, benign 12/14/2012   Breast mass 07/09/2012   Herpes zoster 06/19/2012   Abnormal mammogram 06/09/2012    Past Medical History:  Diagnosis Date   Arthritis    Asthma    High cholesterol    Hypertension    Pre-diabetes    Seasonal allergies     Family History  Problem Relation Age of Onset   Diabetes Mother    Hypertension Mother    Hyperlipidemia Mother    Dementia Mother    COPD Father    Arthritis Father    Thyroid disease Sister    Thyroid disease Sister    Cancer Sister        blood cancer   Asthma Sister    ADD / ADHD Son    Down syndrome Daughter    Past Surgical History:  Procedure Laterality Date   CARPAL TUNNEL RELEASE  unsure   bilaterally   CESAREAN SECTION  12/1999   CHOLECYSTECTOMY  12/1999   MENISCUS REPAIR  11/20/2021   PARTIAL MASTECTOMY WITH NEEDLE LOCALIZATION Right 07/09/2012   Procedure: RIGHT PARTIAL MASTECTOMY WITH NEEDLE LOCALIZATION;  Surgeon: Adin Hector, MD;  Location: [facility];  Service: General;  Laterality: Right;   SHOULDER OPEN ROTATOR CUFF REPAIR  2009   rt shoulder   TOTAL SHOULDER ARTHROPLASTY     Social History   Social History Narrative   Not on file   Immunization History   Administered Date(s) Administered   Influenza Split 09/14/2010, 11/09/2013, 10/14/2014, 10/10/2015, 10/09/2016, 11/10/2017   Influenza, Seasonal, Injecte, Preservative Fre 09/14/2010, 10/09/2016   Influenza,inj,Quad PF,6+ Mos 10/10/2015, 11/10/2017   Influenza,inj,Quad PF,6-35 Mos 01/12/2019   Influenza,inj,quad, With Preservative 11/10/2017   Influenza-Unspecified 11/09/2013, 10/14/2014   PFIZER(Purple Top)SARS-COV-2 Vaccination 03/17/2019, 04/13/2019, 11/12/2019   Pneumococcal Conjugate-13 02/07/2015   Pneumococcal Polysaccharide-23 09/06/2013     Objective: Vital Signs: BP 111/75 (BP Location: Left Arm, Patient Position: Sitting, Cuff Size: Large)   Pulse 83   Resp 16   Ht 5' 5"$  (1.651 m)   Wt 210 lb (95.3 kg)   LMP 11/05/2011   BMI 34.95 kg/m    Physical Exam Vitals and nursing note reviewed.  Constitutional:      Appearance: She is well-developed.  HENT:     Head: Normocephalic and atraumatic.  Eyes:     Conjunctiva/sclera: Conjunctivae normal.  Cardiovascular:     Rate and Rhythm: Normal rate and regular rhythm.     Heart sounds: Normal heart sounds.  Pulmonary:     Effort: Pulmonary effort is normal.  Breath sounds: Normal breath sounds.  Abdominal:     General: Bowel sounds are normal.     Palpations: Abdomen is soft.  Musculoskeletal:     Cervical back: Normal range of motion.  Skin:    General: Skin is warm and dry.     Capillary Refill: Capillary refill takes less than 2 seconds.  Neurological:     Mental Status: She is alert and oriented to person, place, and time.  Psychiatric:        Behavior: Behavior normal.      Musculoskeletal Exam: C-spine, thoracic spine, and lumbar spine good ROM.  Shoulder joints, elbow joints, wrist joints, Mcps, PIPs, and DIPs good ROM with no synovitis.  Complete fist formation bilaterally.  Hip joints, knee joints, and ankle joints have good ROM with no discomfort.  No warmth or effusion of knee joints.  No tenderness  or swelling of ankle joints.    CDAI Exam: CDAI Score: -- Patient Global: --; Provider Global: -- Swollen: --; Tender: -- Joint Exam 02/24/2022   No joint exam has been documented for this visit   There is currently no information documented on the homunculus. Go to the Rheumatology activity and complete the homunculus joint exam.  Investigation: No additional findings.  Imaging: No results found.  Recent Labs: Lab Results  Component Value Date   WBC 10.9 (H) 10/28/2021   HGB 13.2 10/28/2021   PLT 346 10/28/2021   NA 139 10/28/2021   K 4.1 10/28/2021   CL 100 10/28/2021   CO2 28 10/28/2021   GLUCOSE 88 10/28/2021   BUN 16 10/28/2021   CREATININE 0.94 10/28/2021   BILITOT 0.3 10/28/2021   ALKPHOS 97 07/30/2016   AST 18 10/28/2021   ALT 21 10/28/2021   PROT 7.6 10/28/2021   ALBUMIN 4.1 07/30/2016   CALCIUM 9.9 10/28/2021   GFRAA 80 04/25/2020   QFTBGOLDPLUS NEGATIVE 09/10/2021    Speciality Comments: No specialty comments available.  Procedures:  No procedures performed Allergies: Other, Tape, and Tree extract    Assessment / Plan:     Visit Diagnoses: Other ulcerative colitis without complication ([place]) - Patient is followed by Dr. Collene Mares.  She had a routine colonoscopy on 03/05/2020: She has not had any signs or symptoms of an ulcerative colitis flare.  She continues to see Dr. Collene Mares every 2 years and has routine colonoscopies performed.  Her ulcerative colitis has been well-controlled with the use of Humira 40 mg subcu injections every 14 days and methotrexate 4 tablets by mouth once weekly.  She does not need any refills at this time.  No medication changes will be made.  She was advised to notify us if she develops signs or symptoms of a flare.  She will follow-up in the office in 5 months or sooner if needed.  High risk medication use - Humira 40 mg sq injections every 14 days, methotrexate 4 tablets every 7 days, and folic acid 1 mg daily.  CMP updated on  01/16/2021.  CBC drawn on 11/11/2021.CBC released today.  Future order for CBC was placed today.  Patient plans on coming back to have an updated CBC next week. TB gold negative on 09/10/21 and will continue to be monitored yearly.  No recent or recurrent infections.  Discussed the importance of holding Humira and methotrexate if she develops signs or symptoms of infection and to resume once the infection has completely cleared. She was evaluated by her dermatologist in August 2023.  She plans on continuing  to follow-up with dermatology on a yearly basis for skin cancer screening while on Humira. No new medical conditions. - Plan: CBC with Differential/Platelet  Sacroiliitis [facility]): Patient presents today with some increased discomfort in both SI joints.  She has mild tenderness upon palpation bilaterally.  She attributes the increased discomfort in her SI joints to recent weather changes.  She has not had a change in activity.  Patient was advised to notify us if her symptoms persist or worsen at which time she can return for an SI joint cortisone injection if needed.  Chronic pain of both shoulders - Status post right shoulder surgery by Dr. Amedeo Plenty.  She has some discomfort in the left shoulder.  Patient reports that she woke up with the discomfort and has not had any injury or fall.  She has been trying home exercises with no improvement in her symptoms.  She has difficulty lying on her left side at night.  She plans on following up with Dr. Edwin Cap for further evaluation of her symptoms persist or worsen.  Primary osteoarthritis of both knees - Bilateral mild osteoarthritis and chondromalacia patella.  She has good range of motion of both knee joints with no warmth or effusion.  Primary osteoarthritis of both feet: She experiences some pain and stiffness in both feet first thing in the morning.  Good range of motion of both ankle joints with no tenderness or synovitis.  PIP and DIP thickening consistent  with osteoarthritis of both feet.  No evidence of Achilles tendinitis or plantar fasciitis.  Discussed the importance of wearing proper fitting shoes.  S/p bilateral carpal tunnel release - Performed by Dr. Amedeo Plenty.  Asymptomatic at this time.  DDD (degenerative disc disease), cervical - Followed by Dr. Ramos-performed injection in the past.  Good range of motion of the C-spine with no discomfort currently.  Other medical conditions are listed as follows:  History of peptic ulcer disease  History of asthma - Related to allergies.  History of cholecystectomy  Coarse tremors  History of attention deficit disorder  Vitamin B12 deficiency - History of vitamin B12 deficiency.  Patient has been taking a vitamin B12 supplement.  Her vitamin B12 level will be updated with upcoming lab work. plan: Vitamin B12  Orders: Orders Placed This Encounter  Procedures   CBC with Differential/Platelet   Vitamin B12   No orders of the defined types were placed in this encounter.    Follow-Up Instructions: Return in about 5 months (around 07/25/2022) for UC, Osteoarthritis.   Ofilia Neas, PA-C  Note - This record has been created using Dragon software.  Chart creation errors have been sought, but may not always  have been located. Such creation errors do not reflect on  the standard of medical care.

## 2022-02-24 ENCOUNTER — Encounter: Payer: Self-pay | Admitting: Physician Assistant

## 2022-02-24 ENCOUNTER — Ambulatory Visit: Payer: BC Managed Care – PPO | Attending: Rheumatology | Admitting: Physician Assistant

## 2022-02-24 VITALS — BP 111/75 | HR 83 | Resp 16 | Ht 65.0 in | Wt 210.0 lb

## 2022-02-24 DIAGNOSIS — G252 Other specified forms of tremor: Secondary | ICD-10-CM

## 2022-02-24 DIAGNOSIS — M25511 Pain in right shoulder: Secondary | ICD-10-CM

## 2022-02-24 DIAGNOSIS — Z79899 Other long term (current) drug therapy: Secondary | ICD-10-CM | POA: Diagnosis not present

## 2022-02-24 DIAGNOSIS — M19072 Primary osteoarthritis, left ankle and foot: Secondary | ICD-10-CM

## 2022-02-24 DIAGNOSIS — Z8659 Personal history of other mental and behavioral disorders: Secondary | ICD-10-CM

## 2022-02-24 DIAGNOSIS — Z8709 Personal history of other diseases of the respiratory system: Secondary | ICD-10-CM

## 2022-02-24 DIAGNOSIS — Z8711 Personal history of peptic ulcer disease: Secondary | ICD-10-CM

## 2022-02-24 DIAGNOSIS — M461 Sacroiliitis, not elsewhere classified: Secondary | ICD-10-CM

## 2022-02-24 DIAGNOSIS — E538 Deficiency of other specified B group vitamins: Secondary | ICD-10-CM

## 2022-02-24 DIAGNOSIS — Z9889 Other specified postprocedural states: Secondary | ICD-10-CM

## 2022-02-24 DIAGNOSIS — M5030 Other cervical disc degeneration, unspecified cervical region: Secondary | ICD-10-CM

## 2022-02-24 DIAGNOSIS — G8929 Other chronic pain: Secondary | ICD-10-CM

## 2022-02-24 DIAGNOSIS — Z9049 Acquired absence of other specified parts of digestive tract: Secondary | ICD-10-CM

## 2022-02-24 DIAGNOSIS — K5180 Other ulcerative colitis without complications: Secondary | ICD-10-CM | POA: Diagnosis not present

## 2022-02-24 DIAGNOSIS — M170 Bilateral primary osteoarthritis of knee: Secondary | ICD-10-CM

## 2022-02-24 DIAGNOSIS — M19071 Primary osteoarthritis, right ankle and foot: Secondary | ICD-10-CM

## 2022-02-24 DIAGNOSIS — M25512 Pain in left shoulder: Secondary | ICD-10-CM

## 2022-02-24 NOTE — Patient Instructions (Addendum)
Dr. Jaynee Eagles and Dr. Brett Fairy    Standing Labs We placed an order today for your standing lab work.    Please have your standing labs drawn in April and every 3 months   Please have your labs drawn 2 weeks prior to your appointment so that the provider can discuss your lab results at your appointment.  Please note that you may see your imaging and lab results in [place] before we have reviewed them. We will contact you once all results are reviewed. Please allow our office up to 72 hours to thoroughly review all of the results before contacting the office for clarification of your results.  Lab hours are:   Monday through Thursday from 8:00 am -12:30 pm and 1:00 pm-5:00 pm and Friday from 8:00 am-12:00 pm.  Please be advised, all patients with office appointments requiring lab work will take precedent over walk-in lab work.   Labs are drawn by [facility]. Please bring your co-pay at the time of your lab draw.  You may receive a bill from [facility] for your lab work.  Please note if you are on Hydroxychloroquine and and an order has been placed for a Hydroxychloroquine level, you will need to have it drawn 4 hours or more after your last dose.  If you wish to have your labs drawn at another location, please call the office 24 hours in advance so we can fax the orders.  The office is located at [address] [place], [place], [place] [address] No appointment is necessary.    If you have any questions regarding directions or hours of operation,  please call [phone].   As a reminder, please drink plenty of water prior to coming for your lab work. Thanks!

## 2022-03-03 ENCOUNTER — Telehealth: Payer: Self-pay | Admitting: Rheumatology

## 2022-03-03 DIAGNOSIS — G252 Other specified forms of tremor: Secondary | ICD-10-CM

## 2022-03-03 NOTE — Telephone Encounter (Signed)
Referral placed and patient advised.

## 2022-03-03 NOTE — Telephone Encounter (Signed)
Ok to place referral.

## 2022-03-03 NOTE — Telephone Encounter (Signed)
Patient called the office stating Dr. Estanislado Pandy wanted her to see a neurologist. Dr. Jaynee Eagles or Dr. Brett Fairy. Patient states she called their office and she would need a referral from Dr. Estanislado Pandy.

## 2022-03-10 ENCOUNTER — Other Ambulatory Visit: Payer: Self-pay | Admitting: *Deleted

## 2022-03-10 DIAGNOSIS — E538 Deficiency of other specified B group vitamins: Secondary | ICD-10-CM

## 2022-03-10 DIAGNOSIS — Z79899 Other long term (current) drug therapy: Secondary | ICD-10-CM

## 2022-03-11 LAB — CBC WITH DIFFERENTIAL/PLATELET
Absolute Monocytes: 802 cells/uL (ref 200–950)
Basophils Absolute: 45 cells/uL (ref 0–200)
Basophils Relative: 0.4 %
Eosinophils Absolute: 203 cells/uL (ref 15–500)
Eosinophils Relative: 1.8 %
HCT: 37 % (ref 35.0–45.0)
Hemoglobin: 12.3 g/dL (ref 11.7–15.5)
Lymphs Abs: 3921 cells/uL — ABNORMAL HIGH (ref 850–3900)
MCH: 30.1 pg (ref 27.0–33.0)
MCHC: 33.2 g/dL (ref 32.0–36.0)
MCV: 90.7 fL (ref 80.0–100.0)
MPV: 9.9 fL (ref 7.5–12.5)
Monocytes Relative: 7.1 %
Neutro Abs: 6328 cells/uL (ref 1500–7800)
Neutrophils Relative %: 56 %
Platelets: 337 10*3/uL (ref 140–400)
RBC: 4.08 10*6/uL (ref 3.80–5.10)
RDW: 13 % (ref 11.0–15.0)
Total Lymphocyte: 34.7 %
WBC: 11.3 10*3/uL — ABNORMAL HIGH (ref 3.8–10.8)

## 2022-03-11 LAB — VITAMIN B12: Vitamin B-12: 1097 pg/mL (ref 200–1100)

## 2022-03-11 NOTE — Progress Notes (Signed)
Vitamin B12 WNL.  WBC count is borderline elevated. Absolute lymphocytes are borderline elevated. Please clarify if she has had any recent infections or has been on prednisone recently? Rest of CBC WNL

## 2022-03-17 ENCOUNTER — Other Ambulatory Visit: Payer: Self-pay | Admitting: Physician Assistant

## 2022-03-17 DIAGNOSIS — K5180 Other ulcerative colitis without complications: Secondary | ICD-10-CM

## 2022-03-17 NOTE — Telephone Encounter (Signed)
Next Visit: 08/26/2022  Last Visit: 02/24/2022  Last Fill: 12/23/2021  DX: Other ulcerative colitis without complication   Current Dose per office note 02/24/2022: Humira 40 mg sq injections every 14 days   Labs: 01/16/2022 CMP WNL 03/10/2022 WBC count is borderline elevated. Absolute lymphocytes are borderline elevated. Rest of CBC WNL   TB Gold: 09/10/2021 Neg    Okay to refill Humira?

## 2022-03-27 ENCOUNTER — Other Ambulatory Visit: Payer: Self-pay | Admitting: Physician Assistant

## 2022-03-27 NOTE — Telephone Encounter (Signed)
Next Visit: 08/26/2022  Last Visit: 02/24/2022  Last Fill: 01/08/2022  DX: Other ulcerative colitis without complication   Current Dose per office note 02/24/2022: methotrexate 4 tablets every 7 days   Labs: 01/16/2022 CMP WNL, 03/10/2022 WBC count is borderline elevated. Absolute lymphocytes are borderline elevated. Rest of CBC WNL patient has not had any recent infections and has not been on prednisone   Okay to refill Prednisone?

## 2022-04-07 ENCOUNTER — Ambulatory Visit: Payer: BC Managed Care – PPO | Admitting: Neurology

## 2022-04-10 ENCOUNTER — Ambulatory Visit (INDEPENDENT_AMBULATORY_CARE_PROVIDER_SITE_OTHER): Payer: BC Managed Care – PPO | Admitting: Neurology

## 2022-04-10 VITALS — BP 116/77 | HR 81 | Ht 65.5 in | Wt 203.0 lb

## 2022-04-10 DIAGNOSIS — G250 Essential tremor: Secondary | ICD-10-CM | POA: Diagnosis not present

## 2022-04-10 DIAGNOSIS — Z820 Family history of epilepsy and other diseases of the nervous system: Secondary | ICD-10-CM

## 2022-04-10 NOTE — Progress Notes (Signed)
Subjective:    Patient ID: Mercedes [name] is a 57 y.o. female.  HPI    Star Age, MD, PhD [facility] [address] [place], [place] [address]  Dear Mercedes [name],  I saw your patient, Mercedes [name], upon your kind request in my neurologic clinic today for initial consultation of her tremors.  The patient is unaccompanied today.  As you know, Ms. Chamber is a 57 year old female with an underlying complex medical history of arthritis with status post shoulder surgery on the R, status post knee meniscus repair, degenerative cervical disc disease, history of carpal tunnel syndrome with status post surgery, ulcerative colitis, asthma, hyperlipidemia, hypertension, prediabetes, allergies, and obesity, who reports a several year history of bilateral hand tremors.  Over time, she feels that her tremor has become worse.  She is left-handed.  She reports that her dad had a tremor and paternal grandmother as she recalls also had a tremor.  She recently found out that her paternal aunt also had tremors.  Her mom is 11, she has 2 sisters, none of whom have tremors.  Tremor fluctuates, it is certainly worse when she is anxious or nervous or has not eaten on a regular timetable.  She tries to hydrate really well, she has had better glucose control and has also lost nearly 40 pounds within the past year.  She has been on Mounjaro.  She avoids caffeine, drinks usually 1 cup of coffee in the morning and decaf tea.  She drinks no caffeine soda.  She does not drink any alcohol.  She is widowed, her husband passed away about 8 years ago.  She has a 42 year old daughter that lives with her, but daughter has Down syndrome.  She has a 44 year old son and grandchild.  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  She has not had any serious headaches, no recent falls, does have some trembling and imbalance first thing in the morning especially when she is in the shower.   She sleeps fairly well, if it were not for joint pain, she would sleep through the night.  She has nocturia twice per average night, no significant snoring reported, had a tonsillectomy and adenoidectomy as a child.   I reviewed your office visit note from 02/24/2022.  Of note, she is on multiple medications including high risk medication, including Humira and methotrexate.  She has been on sertraline, currently at 100 mg strength daily.  She had seen Dr. Everette Rank in neurology on 02/28/2019 and I reviewed the consult note.  She was felt to have essential tremor which was nondisabling at the time, no medication was started at the time, tremor contributors were discussed and patient declined a brain MRI at the time.  Her Past Medical History Is Significant For: Past Medical History:  Diagnosis Date   Arthritis    Asthma    High cholesterol    Hypertension    Pre-diabetes    Seasonal allergies     Her Past Surgical History Is Significant For: Past Surgical History:  Procedure Laterality Date   CARPAL TUNNEL RELEASE  unsure   bilaterally   CESAREAN SECTION  12/1999   CHOLECYSTECTOMY  12/1999   MENISCUS REPAIR  11/20/2021   PARTIAL MASTECTOMY WITH NEEDLE LOCALIZATION Right 07/09/2012   Procedure: RIGHT PARTIAL MASTECTOMY WITH NEEDLE LOCALIZATION;  Surgeon: Adin Hector, MD;  Location: [facility];  Service: General;  Laterality: Right;   SHOULDER OPEN ROTATOR CUFF REPAIR  2009   rt shoulder   TOTAL SHOULDER ARTHROPLASTY      Her Family History Is Significant For: Family History  Problem Relation Age of Onset   Diabetes Mother    Hypertension Mother    Hyperlipidemia Mother    Dementia Mother    COPD Father    Arthritis Father    Thyroid disease Sister    Thyroid disease Sister    Cancer Sister        blood cancer   Asthma Sister    ADD / ADHD Son    Down syndrome Daughter     Her Social History Is Significant For: Social History   Socioeconomic History    Marital status: Widowed    Spouse name: Not on file   Number of children: Not on file   Years of education: Not on file   Highest education level: Not on file  Occupational History   Not on file  Tobacco Use   Smoking status: Former    Packs/day: 1.00    Years: 4.00    Additional pack years: 0.00    Total pack years: 4.00    Types: Cigarettes    Quit date: 01/14/1995    Years since quitting: 27.2    Passive exposure: Never   Smokeless tobacco: Never  Vaping Use   Vaping Use: Never used  Substance and Sexual Activity   Alcohol use: No   Drug use: No   Sexual activity: Not on file  Other Topics Concern   Not on file  Social History Narrative   Not on file   Social Determinants of Health   Financial Resource Strain: Not on file  Food Insecurity: Not on file  Transportation Needs: Not on file  Physical Activity: Not on file  Stress: Not on file  Social Connections: Not on file    Her Allergies Are:  Allergies  Allergen Reactions   Other Other (See Comments)    Animals    Tape     Other reaction(s): BANDAIDS   Tree Extract Other (See Comments)  :   Her Current Medications Are:  Outpatient Encounter Medications as of 04/10/2022  Medication Sig   atorvastatin (LIPITOR) 20 MG tablet TAKE 1 TABLET BY MOUTH DAILY WITH BREAKFAST   buPROPion (WELLBUTRIN XL) 300 MG 24 hr tablet Take 300 mg by mouth every morning.    folic acid (FOLVITE) 1 MG tablet Take 1 tablet (1 mg total) by mouth daily.   HUMIRA, 2 PEN, 40 MG/0.4ML PNKT INJECT 40 MG UNDER THE SKIN EVERY 14 DAYS   lisinopril-hydrochlorothiazide (PRINZIDE,ZESTORETIC) 20-12.5 MG per tablet 1 tablet daily.    metFORMIN (GLUCOPHAGE) 500 MG tablet Take 500 mg by mouth daily.   methotrexate (RHEUMATREX) 2.5 MG tablet Take 4 tablets by mouth once weekly. Caution chemotherapy medication, protect from light.   sertraline (ZOLOFT) 100 MG tablet Take 100 mg by mouth daily.   sertraline (ZOLOFT) 50 MG tablet Take 50 mg by mouth  daily.   tirzepatide [facility]) 5 MG/0.5ML Pen Inject 5 mg into the skin once a week.   No facility-administered encounter medications on file as of 04/10/2022.  :   Review of Systems:  Out of a complete 14 point review of systems, all are reviewed and negative with the exception of these symptoms as listed below:   Review of Systems  Neurological:        Pt here for trremors,  had been to neurologist in [facility] 2018-2019. To monitor.  Now progressivley getting worse over the last year.  Worsens with anxiety/stress.  Bilater hand tremors and teeth chattering.  Tremor sheet done.     Objective:  Neurological Exam  Physical Exam Physical Examination:   Vitals:   04/10/22 1223  BP: 116/77  Pulse: 81    General Examination: The patient is a very pleasant 57 y.o. female in no acute distress. She appears well-developed and well-nourished and well groomed.   HEENT: Normocephalic, atraumatic, pupils are equal, round and reactive to light, extraocular tracking is good without limitation to gaze excursion or nystagmus noted.  Corrective eyeglasses in place, mild/beginning cataracts bilaterally.  Hearing is grossly intact. Face is symmetric with normal facial animation. Speech is clear with no dysarthria noted. There is no hypophonia. There is no lip, neck/head, jaw or voice tremor. Neck is supple with full range of passive and active motion. There are no carotid bruits on auscultation. Oropharynx exam reveals: mild mouth dryness, good dental hygiene and mild airway crowding, due to smaller airway entry.  Tongue protrudes centrally and palate elevates symmetrically.  Chest: Clear to auscultation without wheezing, rhonchi or crackles noted.  Heart: S1+S2+0, regular and normal without murmurs, rubs or gallops noted.   Abdomen: Soft, non-tender and non-distended.  Extremities: There is no pitting edema in the distal lower extremities bilaterally.   Skin: Warm and dry without trophic  changes noted.   Musculoskeletal: exam reveals mild decreased range of motion in right shoulder and right hip.  Arthritic changes in both hands.     Neurologically:  Mental status: The patient is awake, alert and oriented in all 4 spheres. Her immediate and remote memory, attention, language skills and fund of knowledge are appropriate. There is no evidence of aphasia, agnosia, apraxia or anomia. Speech is clear with normal prosody and enunciation. Thought process is linear. Mood is normal and affect is normal.  Cranial nerves II - XII are as described above under HEENT exam.  Motor exam: Normal bulk, strength and tone is noted. There is no obvious resting tremor.  No drift or rebound. On 04/10/2022: On Archimedes spiral drawing she has minimal trembling with the right hand, mild insecurity with the left hand, handwriting with the left hand is legible, not particularly tremulous, not micrographic. She has a very mild bilateral upper extremity postural and mild action tremor in both upper extremities, no lateralization.  No intention tremor. Fine motor skills and coordination: intact finger tap, hand movements and rapid alternating patting with both upper extremities, intact foot taps bilaterally in the lower extremities.   Cerebellar testing: No dysmetria or intention tremor. There is no truncal or gait ataxia.  Normal finger-to-nose, normal heel-to-shin with the exception of mild action tremor with finger-to-nose, no intention tremor. Sensory exam: intact to light touch in the upper and lower extremities.  Gait, station and balance: She stands easily. No veering to one side is noted. No leaning to one side is noted. Posture is age-appropriate and stance is narrow based. Gait shows normal stride length and normal pace. No problems turning are noted.  Romberg is negative with the exception of very mild sway.  Tandem walk challenging for her but doable.  Assessment and Plan:  In summary, Cady E Luma  is a very pleasant 57 y.o.-year old female with an underlying complex medical history of arthritis with status post shoulder surgery on the R, status post knee meniscus repair, degenerative cervical disc disease, history of carpal tunnel syndrome with status post surgery, ulcerative colitis, asthma,  hyperlipidemia, hypertension, prediabetes, allergies, and obesity, who presents for evaluation of her tremor disorder of several years duration.  History and examination and family history are in keeping with essential tremor.  Current findings are on the mild side, but she does endorse fluctuation in her tremors.  She has never tried any medication for this and we talked about possible symptomatic medication options including utilizing low-dose beta-blocker versus primidone versus a combination of both at some point.  We mutually agreed to continue to monitor her symptoms and findings, she is not particularly keen on adding another medication at this point.  A beta-blocker could run the risk of lowering her blood pressure as she does have a low normal blood pressure.  She also reports that since her weight loss she had to have a reduction in her blood pressure medication.  She continues to pursue weight loss.  We talked about the contributors of tremors and triggers, alleviating factors as well today.  She is encouraged to stay well-hydrated with water, limit her caffeine as she is, and try to sleep well, make enough time for sleep.  She is pursuing all of these.  Certain medications can exacerbate tremors such as antidepressant medications including SSRI type medications.  She has been stable on sertraline and there is no reason to switch her to another medication or decrease the dose.  She is advised that we can certainly look at a brain MRI for any structural cause of her tremor but examination findings are not alarming, no red flags, no signs of parkinsonism.  She is largely reassured.  We can consider a brain MRI  at a later date if she would like, she is not keen on pursuing a scan at this time.  We will reconvene in about a year, sooner if needed.  I answered all her questions today and she was in agreement with our plan.   Thank you very much for allowing me to participate in the care of this nice patient. If I can be of any further assistance to you please do not hesitate to call me at [phone].  Sincerely,   Star Age, MD, PhD

## 2022-04-10 NOTE — Patient Instructions (Signed)
You have a (currently) mild tremor of both hands, likely essential tremor.  I do not see any signs or symptoms of parkinson's like disease or what we call parkinsonism.  For your tremor, I would not recommend any new medications at this time, but we can consider a low dose beta blocker, such as propranolol in the future. We may consider another medication for tremor, called primidone in the future as well.  For now, we will monitor.   Please remember, that any kind of tremor may be exacerbated by anxiety, anger, nervousness, excitement, dehydration, sleep deprivation, thyroid dysfunction, by caffeine, and low blood sugar values or blood sugar fluctuations. Some medications can exacerbate tremors, this includes certain asthma or COPD medications and certain antidepressants, including sertraline.

## 2022-04-18 ENCOUNTER — Telehealth: Payer: Self-pay | Admitting: Pharmacist

## 2022-04-18 NOTE — Telephone Encounter (Signed)
Submitted a Prior Authorization renewal request to [facility] for HUMIRA via CoverMyMeds. Will update once we receive a response.  Key: [id number]  Chesley Mires, PharmD, MPH, BCPS, CPP Clinical Pharmacist (Rheumatology and Pulmonology)

## 2022-04-21 NOTE — Telephone Encounter (Signed)
Received notification from [facility] regarding a prior authorization for HUMIRA. Authorization has been APPROVED from 04/18/22 to 04/18/23. Approval letter sent to scan center.  Patient can continue to fill through [facility]: [phone]  Authorization # [id number] Phone: [phone]  Chesley Mires, PharmD, MPH, BCPS, CPP Clinical Pharmacist (Rheumatology and Pulmonology)

## 2022-06-06 ENCOUNTER — Other Ambulatory Visit: Payer: Self-pay | Admitting: Physician Assistant

## 2022-06-06 ENCOUNTER — Encounter: Payer: Self-pay | Admitting: *Deleted

## 2022-06-06 NOTE — Telephone Encounter (Signed)
Last Fill: 03/27/2022  Labs: 04/21/2022 CMP WNL 03/10/2022 WBC count is borderline elevated. Absolute lymphocytes are borderline elevated Rest of CBC WNL   Next Visit: 08/26/2022  Last Visit: 02/24/2022  DX: Other ulcerative colitis without complication   Current Dose per office note 02/24/2022: methotrexate 4 tablets every 7 days   Patient notified patient via my chart she is due to update CBC  Okay to refill Methotrexate?

## 2022-06-10 NOTE — Telephone Encounter (Signed)
I have not seen this patient.

## 2022-06-16 ENCOUNTER — Other Ambulatory Visit: Payer: Self-pay | Admitting: Physician Assistant

## 2022-06-16 DIAGNOSIS — K5180 Other ulcerative colitis without complications: Secondary | ICD-10-CM

## 2022-06-16 NOTE — Telephone Encounter (Signed)
Last Fill: 03/17/2022  Labs: 04/21/2022 CMP normal, CBC 03/10/2022  Vitamin B12 WNL. WBC count is borderline elevated. Absolute lymphocytes are borderline elevated.  TB Gold:  09/11/2022 TB gold negative   Next Visit: 08/26/2022   Last Visit: 02/24/2022  DX:Other ulcerative colitis without complication   Current Dose per office note 02/24/2022: Humira 40 mg sq injections every 14 days   Okay to refill Humira?

## 2022-08-10 ENCOUNTER — Other Ambulatory Visit: Payer: Self-pay | Admitting: Rheumatology

## 2022-08-12 NOTE — Progress Notes (Signed)
Office Visit Note  Patient: Mercedes [name]             Date of Birth: [birth year]           MRN: [id number]             PCP: Shellia Cleverly, PA Referring: Shellia Cleverly, PA Visit Date: 08/26/2022 Occupation: @GUAROCC @  Subjective:  Medication management  History of Present Illness: Mercedes [name] is a 57 y.o. female with ulcerative colitis, inflammatory arthritis, sacroiliitis and osteoarthritis.  She states her GI symptoms are controlled with the combination of Humira and methotrexate.  She has not had a flare of inflammatory arthritis.  She continues to have SI joint discomfort.  She has intermittent pain in her hands and knee joints.  She has occasional discomfort in her feet.  She denies having flares of plantar fasciitis, Achilles tendinitis or uveitis.  She continues to have some stiffness in her cervical spine.  She has not had any recent injections.  She continues to be on Humira 40 mg subcu every 14 days and methotrexate 4 tablets every 7 days along with folic acid 1 mg p.o. daily.  She has not had any interruption in the treatment.  She has been tolerating medications without any side effects.  She has been going to the dermatologist once a year for skin examination.    Activities of Daily Living:  Patient reports morning stiffness for 1 hour.   Patient Reports nocturnal pain.  Difficulty dressing/grooming: Denies Difficulty climbing stairs: Reports Difficulty getting out of chair: Reports Difficulty using hands for taps, buttons, cutlery, and/or writing: Denies  Review of Systems  Constitutional:  Positive for fatigue.  HENT:  Negative for mouth sores and mouth dryness.   Eyes:  Negative for dryness.  Respiratory:  Negative for shortness of breath.   Cardiovascular:  Negative for chest pain and palpitations.  Gastrointestinal:  Negative for blood in stool, constipation and diarrhea.  Endocrine: Negative for increased urination.  Genitourinary:  Negative for involuntary  urination.  Musculoskeletal:  Positive for joint pain, joint pain, joint swelling, myalgias, muscle weakness, morning stiffness, muscle tenderness and myalgias. Negative for gait problem.  Skin:  Negative for color change, rash, hair loss and sensitivity to sunlight.  Allergic/Immunologic: Negative for susceptible to infections.  Neurological:  Negative for dizziness and headaches.  Hematological:  Negative for swollen glands.  Psychiatric/Behavioral:  Positive for sleep disturbance. Negative for depressed mood. The patient is not nervous/anxious.     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   History of cholecystectomy 07/30/2016   Diabetes mellitus (HCC) 01/29/2016   Impetigo 01/09/2016   Anniversary reaction 12/20/2015   Sacroiliitis (HCC) 12/19/2015   Primary osteoarthritis of both knees 12/19/2015   Primary osteoarthritis of both feet 12/19/2015   Depression 12/19/2015   Peptic ulcer disease 12/19/2015   Hemorrhoids 12/19/2015   S/p bilateral carpal tunnel  release 12/19/2015   Dyslipidemia 12/19/2015   Intermittent asthma without complication 12/19/2015   Environmental allergies 12/19/2015   Attention deficit hyperactivity disorder (ADHD) 12/19/2015   Ulcerative colitis (HCC) 12/18/2015   High risk medication use 12/18/2015   Need for hepatitis C screening test 07/12/2015   Need for prophylactic  vaccination against Streptococcus pneumoniae (pneumococcus) 02/07/2015   Type 2 diabetes mellitus without complication, without long-term current use of insulin (HCC) 02/07/2015   Anemia 06/13/2013   Injury of hand, right 06/13/2013   Prediabetes 06/13/2013   Anxiety state 12/14/2012   Hyperlipidemia 12/14/2012   Hypertension, benign 12/14/2012   Breast mass 07/09/2012   Herpes zoster 06/19/2012   Abnormal mammogram 06/09/2012    Past Medical History:  Diagnosis Date   Arthritis    Asthma    High cholesterol    Hypertension    Pre-diabetes    Seasonal allergies     Family History  Problem Relation Age of Onset   Diabetes Mother    Hypertension Mother    Hyperlipidemia Mother    Dementia Mother    COPD Father    Arthritis Father    Thyroid disease Sister    Thyroid disease Sister    Cancer Sister        blood cancer   Asthma Sister    ADD / ADHD Son    Down syndrome Daughter    Past Surgical History:  Procedure Laterality Date   CARPAL TUNNEL RELEASE  unsure   bilaterally   CESAREAN SECTION  12/1999   CHOLECYSTECTOMY  12/1999   MENISCUS REPAIR Left 11/20/2021   PARTIAL MASTECTOMY WITH NEEDLE LOCALIZATION Right 07/09/2012   Procedure: RIGHT PARTIAL MASTECTOMY WITH NEEDLE LOCALIZATION;  Surgeon: Ernestene Mention, MD;  Location: [facility];  Service: General;  Laterality: Right;   SHOULDER OPEN ROTATOR CUFF REPAIR  2009   rt shoulder   TOTAL SHOULDER ARTHROPLASTY     Social History   Social History Narrative   Not on file   Immunization History  Administered Date(s) Administered   Influenza Split 09/14/2010, 11/09/2013, 10/14/2014, 10/10/2015, 10/09/2016, 11/10/2017   Influenza, Seasonal, Injecte, Preservative Fre 09/14/2010, 10/09/2016   Influenza,inj,Quad PF,6+ Mos 10/10/2015, 11/10/2017   Influenza,inj,Quad PF,6-35 Mos 01/12/2019   Influenza,inj,quad, With Preservative 11/10/2017   Influenza-Unspecified 11/09/2013, 10/14/2014    PFIZER(Purple Top)SARS-COV-2 Vaccination 03/17/2019, 04/13/2019, 11/12/2019   Pfizer Covid-19 Vaccine Bivalent Booster 49yrs & up 12/05/2020   Pneumococcal Conjugate-13 02/07/2015   Pneumococcal Polysaccharide-23 09/06/2013     Objective: Vital Signs: BP 106/72 (BP Location: Left Arm, Patient Position: Sitting, Cuff Size: Normal)   Pulse 85   Resp 16   Ht 5\' 5"  (1.651 m)   Wt 195 lb 6.4 oz (88.6 kg)   LMP 11/05/2011   BMI 32.52 kg/m    Physical Exam Vitals and nursing note reviewed.  Constitutional:      Appearance: She is well-developed.  HENT:     Head: Normocephalic and atraumatic.  Eyes:     Conjunctiva/sclera: Conjunctivae normal.  Cardiovascular:     Rate and Rhythm: Normal rate and regular rhythm.     Heart sounds: Normal heart sounds.  Pulmonary:     Effort: Pulmonary effort is normal.     Breath sounds: Normal breath sounds.  Abdominal:     General: Bowel sounds are normal.     Palpations: Abdomen is soft.  Musculoskeletal:     Cervical back: Normal range of motion.  Lymphadenopathy:     Cervical: No  cervical adenopathy.  Skin:    General: Skin is warm and dry.     Capillary Refill: Capillary refill takes less than 2 seconds.  Neurological:     Mental Status: She is alert and oriented to person, place, and time.  Psychiatric:        Behavior: Behavior normal.      Musculoskeletal Exam: He had limited lateral rotation of the cervical spine.  She had bilateral trapezius spasm.  Shoulder joints, elbow joints, wrist joints, MCPs PIPs and DIPs with good range of motion.  She had left first MCP thickening and mild inflammation..  Bilateral PIP and DIP thickening was noted.  Hip joints and knee joints were in good range of motion.  She had no tenderness over ankles or MTPs.  Bilateral first MTP thickening was noted.  CDAI Exam: CDAI Score: -- Patient Global: --; Provider Global: -- Swollen: --; Tender: -- Joint Exam 08/26/2022   No joint exam has been  documented for this visit   There is currently no information documented on the homunculus. Go to the Rheumatology activity and complete the homunculus joint exam.  Investigation: No additional findings.  Imaging: No results found.  Recent Labs: Lab Results  Component Value Date   WBC 11.3 (H) 03/10/2022   HGB 12.3 03/10/2022   PLT 337 03/10/2022   NA 139 10/28/2021   K 4.1 10/28/2021   CL 100 10/28/2021   CO2 28 10/28/2021   GLUCOSE 88 10/28/2021   BUN 16 10/28/2021   CREATININE 0.94 10/28/2021   BILITOT 0.3 10/28/2021   ALKPHOS 97 07/30/2016   AST 18 10/28/2021   ALT 21 10/28/2021   PROT 7.6 10/28/2021   ALBUMIN 4.1 07/30/2016   CALCIUM 9.9 10/28/2021   GFRAA 80 04/25/2020   QFTBGOLDPLUS NEGATIVE 09/10/2021    Speciality Comments: No specialty comments available.  Procedures:  No procedures performed Allergies: Other, Tape, and Tree extract   Assessment / Plan:     Visit Diagnoses: Other ulcerative colitis without complication (HCC) - Patient is followed by Dr. Loreta Ave.  She had a routine colonoscopy on 03/05/2020: Patient states she has not had a flare of UC in a long time.  She denies any recent problems with diarrhea or constipation.  High risk medication use - Humira 40 mg sq injections every 14 days, methotrexate 4 tablets every 7 days, and folic acid 1 mg daily.  She has been noncompliant with the lab work.  She has not had labs since October 2023.  Risk of hepatorenal toxicity with these medications and also neutropenia with discussed.  Close monitoring of labs every 3 months was advised.  Patient voiced understanding.  Will obtain labs today.  Her last TB Gold was in August 2023.  Will check TB Gold today.  Information about immunization was placed in the AVS.  She was advised to hold Humira and methotrexate if she develops an infection and resume after the infection resolves.  Sacroiliitis (HCC)-she gives history of intermittent SI joint pain.  She had no  tenderness on the examination today.  Chronic pain of both shoulders -she had good range of motion without discomfort today.  Status post right shoulder surgery by Dr. Amanda Pea.  Primary osteoarthritis of both hands-she had bilateral PIP and DIP thickening.  Left first MCP inflammation was noted.  I offered cortisone injection but patient declined.  Primary osteoarthritis of both knees -she is intermittent pain.  No warmth swelling or effusion was noted.  Bilateral mild osteoarthritis and chondromalacia  patella.  Primary osteoarthritis of both feet-she has osteoarthritic changes in her feet with MTP and PIP and DIP thickening.  Proper fitting shoes with arch support was advised.  S/p bilateral carpal tunnel release - Performed by Dr. Amanda Pea.  DDD (degenerative disc disease), cervical - Followed by Dr. Ethelene Hal.  She had limited range of motion.  She had bilateral trapezius spasm.  I offered trigger point injections which she declined.  A handout on cervical spine exercises was given.  Other medical problems are listed as follows:  History of peptic ulcer disease  History of asthma  History of attention deficit disorder  History of cholecystectomy  Coarse tremors  Vitamin B12 deficiency  Orders: Orders Placed This Encounter  Procedures   CBC with Differential/Platelet   COMPLETE METABOLIC PANEL WITH GFR   QuantiFERON-TB Gold Plus   No orders of the defined types were placed in this encounter.    Follow-Up Instructions: Return in about 5 months (around 01/26/2023) for UC, inflammatory arthritis.   Pollyann Savoy, MD  Note - This record has been created using Animal nutritionist.  Chart creation errors have been sought, but may not always  have been located. Such creation errors do not reflect on  the standard of medical care.

## 2022-08-26 ENCOUNTER — Encounter: Payer: Self-pay | Admitting: Rheumatology

## 2022-08-26 ENCOUNTER — Ambulatory Visit: Payer: BC Managed Care – PPO | Attending: Rheumatology | Admitting: Rheumatology

## 2022-08-26 VITALS — BP 106/72 | HR 85 | Resp 16 | Ht 65.0 in | Wt 195.4 lb

## 2022-08-26 DIAGNOSIS — M170 Bilateral primary osteoarthritis of knee: Secondary | ICD-10-CM

## 2022-08-26 DIAGNOSIS — M19041 Primary osteoarthritis, right hand: Secondary | ICD-10-CM

## 2022-08-26 DIAGNOSIS — M5030 Other cervical disc degeneration, unspecified cervical region: Secondary | ICD-10-CM

## 2022-08-26 DIAGNOSIS — Z8711 Personal history of peptic ulcer disease: Secondary | ICD-10-CM

## 2022-08-26 DIAGNOSIS — M25511 Pain in right shoulder: Secondary | ICD-10-CM

## 2022-08-26 DIAGNOSIS — E538 Deficiency of other specified B group vitamins: Secondary | ICD-10-CM

## 2022-08-26 DIAGNOSIS — M19071 Primary osteoarthritis, right ankle and foot: Secondary | ICD-10-CM

## 2022-08-26 DIAGNOSIS — M19072 Primary osteoarthritis, left ankle and foot: Secondary | ICD-10-CM

## 2022-08-26 DIAGNOSIS — Z79899 Other long term (current) drug therapy: Secondary | ICD-10-CM

## 2022-08-26 DIAGNOSIS — M461 Sacroiliitis, not elsewhere classified: Secondary | ICD-10-CM | POA: Diagnosis not present

## 2022-08-26 DIAGNOSIS — Z9049 Acquired absence of other specified parts of digestive tract: Secondary | ICD-10-CM

## 2022-08-26 DIAGNOSIS — K5180 Other ulcerative colitis without complications: Secondary | ICD-10-CM | POA: Diagnosis not present

## 2022-08-26 DIAGNOSIS — M19042 Primary osteoarthritis, left hand: Secondary | ICD-10-CM

## 2022-08-26 DIAGNOSIS — G8929 Other chronic pain: Secondary | ICD-10-CM

## 2022-08-26 DIAGNOSIS — Z8709 Personal history of other diseases of the respiratory system: Secondary | ICD-10-CM

## 2022-08-26 DIAGNOSIS — Z9889 Other specified postprocedural states: Secondary | ICD-10-CM

## 2022-08-26 DIAGNOSIS — Z8659 Personal history of other mental and behavioral disorders: Secondary | ICD-10-CM

## 2022-08-26 DIAGNOSIS — G252 Other specified forms of tremor: Secondary | ICD-10-CM

## 2022-08-26 DIAGNOSIS — M25512 Pain in left shoulder: Secondary | ICD-10-CM

## 2022-08-26 LAB — CBC WITH DIFFERENTIAL/PLATELET
Absolute Monocytes: 765 cells/uL (ref 200–950)
Basophils Absolute: 36 cells/uL (ref 0–200)
Basophils Relative: 0.4 %
Eosinophils Absolute: 180 cells/uL (ref 15–500)
Eosinophils Relative: 2 %
HCT: 38.5 % (ref 35.0–45.0)
Hemoglobin: 12.8 g/dL (ref 11.7–15.5)
Lymphs Abs: 3285 cells/uL (ref 850–3900)
MCH: 30.2 pg (ref 27.0–33.0)
MCHC: 33.2 g/dL (ref 32.0–36.0)
MCV: 90.8 fL (ref 80.0–100.0)
MPV: 10.3 fL (ref 7.5–12.5)
Monocytes Relative: 8.5 %
Neutro Abs: 4734 cells/uL (ref 1500–7800)
Neutrophils Relative %: 52.6 %
Platelets: 306 10*3/uL (ref 140–400)
RBC: 4.24 10*6/uL (ref 3.80–5.10)
RDW: 13.4 % (ref 11.0–15.0)
Total Lymphocyte: 36.5 %
WBC: 9 10*3/uL (ref 3.8–10.8)

## 2022-08-26 NOTE — Patient Instructions (Addendum)
Standing Labs We placed an order today for your standing lab work.   Please have your standing labs drawn in November and every 3 months  Please have your labs drawn 2 weeks prior to your appointment so that the provider can discuss your lab results at your appointment, if possible.  Please note that you may see your imaging and lab results in MyChart before we have reviewed them. We will contact you once all results are reviewed. Please allow our office up to 72 hours to thoroughly review all of the results before contacting the office for clarification of your results.  WALK-IN LAB HOURS  Monday through Thursday from 8:00 am -12:30 pm and 1:00 pm-5:00 pm and Friday from 8:00 am-12:00 pm.  Patients with office visits requiring labs will be seen before walk-in labs.  You may encounter longer than normal wait times. Please allow additional time. Wait times may be shorter on  Monday and Thursday afternoons.  We do not book appointments for walk-in labs. We appreciate your patience and understanding with our staff.   Labs are drawn by [facility]. Please bring your co-pay at the time of your lab draw.  You may receive a bill from [facility] for your lab work.  Please note if you are on Hydroxychloroquine and and an order has been placed for a Hydroxychloroquine level,  you will need to have it drawn 4 hours or more after your last dose.  If you wish to have your labs drawn at another location, please call the office 24 hours in advance so we can fax the orders.  The office is located at [address], [place], [place] [address]   If you have any questions regarding directions or hours of operation,  please call [phone].   As a reminder, please drink plenty of water prior to coming for your lab work. Thanks!   Vaccines You are taking a medication(s) that can suppress your immune system.  The following immunizations are recommended: Flu annually Covid-19  RSV Td/Tdap (tetanus,  diphtheria, pertussis) every 10 years Pneumonia (Prevnar 15 then Pneumovax 23 at least 1 year apart.  Alternatively, can take Prevnar 20 without needing additional dose) Shingrix: 2 doses from 4 weeks to 6 months apart  Please check with your PCP to make sure you are up to date. If you have signs or symptoms of an infection or start antibiotics: First, call your PCP for workup of your infection. Hold your medication through the infection, until you complete your antibiotics, and until symptoms resolve if you take the following: Injectable medication (Actemra, Benlysta, Cimzia, Cosentyx, Enbrel, Humira, Kevzara, Orencia, Remicade, Simponi, Stelara, Taltz, Tremfya) Methotrexate Leflunomide (Arava) Mycophenolate (Cellcept) Harriette Ohara, Olumiant, or Rinvoq  Please get an annual skin examination to screen for skin cancer while you are on Humira.  Please use sunscreen and sun protection. Cervical Strain and Sprain Rehab Ask your health care provider which exercises are safe for you. Do exercises exactly as told by your health care provider and adjust them as directed. It is normal to feel mild stretching, pulling, tightness, or discomfort as you do these exercises. Stop right away if you feel sudden pain or your pain gets worse. Do not begin these exercises until told by your health care provider. Stretching and range-of-motion exercises Cervical side bending  Using good posture, sit on a stable chair or stand up. Without moving your shoulders, slowly tilt your left / right ear to your shoulder until you feel a stretch in  the neck muscles on the opposite side. You should be looking straight ahead. Hold for __________ seconds. Repeat with the other side of your neck. Repeat __________ times. Complete this exercise __________ times a day. Cervical rotation  Using good posture, sit on a stable chair or stand up. Slowly turn your head to the side as if you are looking over your left / right  shoulder. Keep your eyes level with the ground. Stop when you feel a stretch along the side and the back of your neck. Hold for __________ seconds. Repeat this by turning to your other side. Repeat __________ times. Complete this exercise __________ times a day. Thoracic extension and pectoral stretch  Roll a towel or a small blanket so it is about 4 inches (10 cm) in diameter. Lie down on your back on a firm surface. Put the towel in the middle of your back across your spine. It should not be under your shoulder blades. Put your hands behind your head and let your elbows fall out to your sides. Hold for __________ seconds. Repeat __________ times. Complete this exercise __________ times a day. Strengthening exercises Upper cervical flexion  Lie on your back with a thin pillow behind your head or a small, rolled-up towel under your neck. Gently tuck your chin toward your chest and nod your head down to look toward your feet. Do not lift your head off the pillow. Hold for __________ seconds. Release the tension slowly. Relax your neck muscles completely before you repeat this exercise. Repeat __________ times. Complete this exercise __________ times a day. Cervical extension  Stand about 6 inches (15 cm) away from a wall, with your back facing the wall. Place a soft object, about 6-8 inches (15-20 cm) in diameter, between the back of your head and the wall. A soft object could be a small pillow, a ball, or a folded towel. Gently tilt your head back and press into the soft object. Keep your jaw and forehead relaxed. Hold for __________ seconds. Release the tension slowly. Relax your neck muscles completely before you repeat this exercise. Repeat __________ times. Complete this exercise __________ times a day. Posture and body mechanics Body mechanics refer to the movements and positions of your body while you do your daily activities. Posture is part of body mechanics. Good posture and  healthy body mechanics can help to relieve stress in your body's tissues and joints. Good posture means that your spine is in its natural S-curve position (your spine is neutral), your shoulders are pulled back slightly, and your head is not tipped forward. The following are general guidelines for using improved posture and body mechanics in your everyday activities. Sitting  When sitting, keep your spine neutral and keep your feet flat on the floor. Use a footrest, if needed, and keep your thighs parallel to the floor. Avoid rounding your shoulders. Avoid tilting your head forward. When working at a desk or a computer, keep your desk at a height where your hands are slightly lower than your elbows. Slide your chair under your desk so you are close enough to maintain good posture. When working at a computer, place your monitor at a height where you are looking straight ahead and you do not have to tilt your head forward or downward to look at the screen. Standing  When standing, keep your spine neutral and keep your feet about hip-width apart. Keep a slight bend in your knees. Your ears, shoulders, and hips should line up. When  you do a task in which you stand in one place for a long time, place one foot up on a stable object that is 2-4 inches (5-10 cm) high, such as a footstool. This helps keep your spine neutral. Resting When lying down and resting, avoid positions that are most painful for you. Try to support your neck in a neutral position. You can use a contour pillow or a small rolled-up towel. Your pillow should support your neck but not push on it. This information is not intended to replace advice given to you by your health care provider. Make sure you discuss any questions you have with your health care provider. Document Revised: 05/05/2022 Document Reviewed: 07/22/2021 Elsevier Patient Education  2024 [employer].

## 2022-08-29 NOTE — Progress Notes (Signed)
CBC and CMP normal.  TB Gold negative.

## 2022-09-01 ENCOUNTER — Other Ambulatory Visit: Payer: Self-pay | Admitting: Physician Assistant

## 2022-09-01 DIAGNOSIS — K5180 Other ulcerative colitis without complications: Secondary | ICD-10-CM

## 2022-09-01 NOTE — Telephone Encounter (Signed)
Last Fill: 06/16/2022  Labs: 08/26/2022 CBC and CMP normal   TB Gold: 08/26/2022 Neg    Next Visit: 01/28/2023  Last Visit: 08/26/2022  DX:Other ulcerative colitis without complication   Current Dose per office note 08/26/2022: Humira 40 mg sq injections every 14 days   Okay to refill Humira?

## 2022-10-14 ENCOUNTER — Other Ambulatory Visit: Payer: Self-pay | Admitting: *Deleted

## 2022-10-14 MED ORDER — METHOTREXATE SODIUM 2.5 MG PO TABS: ORAL_TABLET | ORAL | 0 refills | Status: DC

## 2022-10-14 NOTE — Telephone Encounter (Signed)
Last Fill: 06/11/2022  Labs: 08/26/2022 CBC and CMP normal.   Next Visit: 01/28/2023  Last Visit: 08/26/2022  DX: Other ulcerative colitis without complication   Current Dose per office note 08/26/2022: methotrexate 4 tablets every 7 days   Okay to refill Methotrexate?

## 2022-11-25 ENCOUNTER — Other Ambulatory Visit: Payer: Self-pay | Admitting: Physician Assistant

## 2022-11-25 DIAGNOSIS — Z79899 Other long term (current) drug therapy: Secondary | ICD-10-CM

## 2022-11-25 DIAGNOSIS — Z9225 Personal history of immunosupression therapy: Secondary | ICD-10-CM

## 2022-11-25 DIAGNOSIS — K5180 Other ulcerative colitis without complications: Secondary | ICD-10-CM

## 2022-11-25 DIAGNOSIS — Z111 Encounter for screening for respiratory tuberculosis: Secondary | ICD-10-CM

## 2022-11-25 NOTE — Telephone Encounter (Signed)
Last Fill: 09/01/2022  Labs: 08/26/2022 CBC and CMP normal   TB Gold: 08/26/2022 Neg    Next Visit: 01/28/2023  Last Visit: 08/26/2022  DX:Other ulcerative colitis without complication   Current Dose per office note 08/26/2022: Humira 40 mg sq injections every 14 days   Patient advised she is due to update labs. Patient states she plans on coming this week to update.   Okay to refill Humira?

## 2022-11-27 ENCOUNTER — Other Ambulatory Visit: Payer: Self-pay | Admitting: *Deleted

## 2022-11-27 ENCOUNTER — Telehealth: Payer: Self-pay | Admitting: Rheumatology

## 2022-11-27 DIAGNOSIS — Z79899 Other long term (current) drug therapy: Secondary | ICD-10-CM

## 2022-11-27 NOTE — Telephone Encounter (Signed)
Patient states she received a bill from our office for $490 for DOS 08/26/22.  Patient states she has never received a bill in the past and called billing.  Billing told her that she owed the entire bill because our office is out of network with [employer].  Patient explained that she has been seeing Dr. Corliss Skains for over 10 years and this is the first time she has been given this information.  Billing explained that "there are several [facility] doctors that are not in network with [employer]" and that they needed payment or she would be sent to collections.  Patient's insurance is [employer]

## 2022-11-28 LAB — COMPLETE METABOLIC PANEL WITH GFR
AG Ratio: 1.5 (calc) (ref 1.0–2.5)
ALT: 23 U/L (ref 6–29)
AST: 18 U/L (ref 10–35)
Albumin: 4.4 g/dL (ref 3.6–5.1)
Alkaline phosphatase (APISO): 95 U/L (ref 37–153)
BUN: 12 mg/dL (ref 7–25)
CO2: 28 mmol/L (ref 20–32)
Calcium: 9.4 mg/dL (ref 8.6–10.4)
Chloride: 104 mmol/L (ref 98–110)
Creat: 0.78 mg/dL (ref 0.50–1.03)
Globulin: 2.9 g/dL (ref 1.9–3.7)
Glucose, Bld: 80 mg/dL (ref 65–99)
Potassium: 4.3 mmol/L (ref 3.5–5.3)
Sodium: 140 mmol/L (ref 135–146)
Total Bilirubin: 0.4 mg/dL (ref 0.2–1.2)
Total Protein: 7.3 g/dL (ref 6.1–8.1)
eGFR: 87 mL/min/{1.73_m2} (ref >=60)

## 2022-11-28 LAB — CBC WITH DIFFERENTIAL/PLATELET
Absolute Lymphocytes: 3277 {cells}/uL (ref 850–3900)
Absolute Monocytes: 740 {cells}/uL (ref 200–950)
Basophils Absolute: 43 {cells}/uL (ref 0–200)
Basophils Relative: 0.5 %
Eosinophils Absolute: 189 {cells}/uL (ref 15–500)
Eosinophils Relative: 2.2 %
HCT: 40.3 % (ref 35.0–45.0)
Hemoglobin: 13 g/dL (ref 11.7–15.5)
MCH: 30.4 pg (ref 27.0–33.0)
MCHC: 32.3 g/dL (ref 32.0–36.0)
MCV: 94.2 fL (ref 80.0–100.0)
MPV: 10.1 fL (ref 7.5–12.5)
Monocytes Relative: 8.6 %
Neutro Abs: 4352 {cells}/uL (ref 1500–7800)
Neutrophils Relative %: 50.6 %
Platelets: 325 10*3/uL (ref 140–400)
RBC: 4.28 10*6/uL (ref 3.80–5.10)
RDW: 13.1 % (ref 11.0–15.0)
Total Lymphocyte: 38.1 %
WBC: 8.6 10*3/uL (ref 3.8–10.8)

## 2022-11-28 NOTE — Progress Notes (Signed)
CBC and CMP are normal.

## 2022-12-13 ENCOUNTER — Other Ambulatory Visit: Payer: Self-pay | Admitting: Physician Assistant

## 2023-01-12 ENCOUNTER — Other Ambulatory Visit: Payer: Self-pay | Admitting: Rheumatology

## 2023-01-12 ENCOUNTER — Telehealth: Payer: Self-pay | Admitting: Rheumatology

## 2023-01-12 NOTE — Telephone Encounter (Signed)
Patient contacted the office to request a medication refill.   1. Name of Medication: Methotrexate  2. How are you currently taking this medication (dosage and times per day)? 4 tablets/once weekly   3. What pharmacy would you like for that to be sent to? [facility]

## 2023-01-12 NOTE — Telephone Encounter (Signed)
Last Fill: 10/14/2022  Labs: 11/27/2022 CBC WNL 12/29/2022 WNL  Next Visit: 01/28/2023  Last Visit: 08/26/2022  DX: Other ulcerative colitis without complication   Current Dose per office note 08/26/2022:  methotrexate 4 tablets every 7 days   Okay to refill Methotrexate?

## 2023-01-12 NOTE — Telephone Encounter (Signed)
Patient called to check the status of her [employer] claim not being paid for DOS 08/26/22 for $490.Marland Kitchen  Patient states she is still being billed.

## 2023-01-13 MED ORDER — METHOTREXATE SODIUM 2.5 MG PO TABS: ORAL_TABLET | ORAL | 0 refills | Status: DC

## 2023-01-13 NOTE — Telephone Encounter (Signed)
 Contacted patient and advised Tisha with billing sent this to [employer] the [employer] for System Wide Pro Fee Billing. It was suppose to be reprocessed by [facility] and put on spreadsheet for them to review back in November and then again I had check on it 12/19/22. I thought they had put it on hold so Ms. Wieczorek would not get a bill for us  until it was resolved. Patient expressed understanding.

## 2023-01-14 NOTE — Progress Notes (Signed)
 Office Visit Note  Patient: Mercedes [name]             Date of Birth: 06/14/1965           MRN: [id number]             PCP: Bentley Bray, PA Referring: Bentley Bray, PA Visit Date: 01/28/2023 Occupation: @GUAROCC @  Subjective:  Medication monitoring   History of Present Illness: Mercedes [name] is a 58 y.o. female with history of ulcerative colitis and osteoarthritis.  Patient remains on  Humira  40 mg sq injections every 14 days, methotrexate  4 tablets every 7 days, and folic acid  1 mg daily.  She is tolerating Humira  and methotrexate  without any side effects.  She denies any signs or symptoms of an ulcerative colitis flare.  Patient has been having updated colonoscopies with Dr. Tova Fresh every 2 to 3 years.  She denies any abdominal pain, diarrhea, or blood in her stool at this time.  Patient denies any signs or symptoms of uveitis flare.  She denies any recent rashes or signs of psoriasis.  Patient reports that about 1 month ago she had a flare involving her SI joints primarily her right SI joint.  Patient states that the flare lasted for about 2 weeks.  She denies any other signs or symptoms of a flare.  She has had some increased discomfort in her feet and is scheduled to see podiatry next week.  Patient reports that she has been resting over the past few weeks and her foot pain has improved significantly. She denies any new medical conditions.  She denies any recurrent infections.    Activities of Daily Living:  Patient reports morning stiffness for 30 minutes.   Patient Denies nocturnal pain.  Difficulty dressing/grooming: Reports Difficulty climbing stairs: Reports Difficulty getting out of chair: Denies Difficulty using hands for taps, buttons, cutlery, and/or writing: Reports  Review of Systems  Constitutional: Negative.  Negative for fatigue.  HENT: Negative.  Negative for mouth sores and mouth dryness.   Eyes: Negative.  Negative for dryness.  Respiratory: Negative.  Negative for  shortness of breath.   Cardiovascular:  Negative for chest pain and palpitations.  Gastrointestinal: Negative.  Negative for blood in stool, constipation and diarrhea.  Endocrine: Negative.  Negative for increased urination.  Genitourinary:  Negative for involuntary urination.  Musculoskeletal:  Positive for joint pain, joint pain and morning stiffness. Negative for gait problem, joint swelling, myalgias, muscle weakness, muscle tenderness and myalgias.  Skin: Negative.  Negative for color change, rash, hair loss and sensitivity to sunlight.  Allergic/Immunologic: Negative.  Negative for susceptible to infections.  Neurological: Negative.  Negative for dizziness and headaches.  Hematological: Negative.  Negative for swollen glands.  Psychiatric/Behavioral: Negative.  Negative for depressed mood and sleep disturbance. The patient is not nervous/anxious.     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   History  of cholecystectomy 07/30/2016   Diabetes mellitus (HCC) 01/29/2016   Impetigo 01/09/2016   Anniversary reaction 12/20/2015   Sacroiliitis (HCC) 12/19/2015   Primary osteoarthritis of both knees 12/19/2015   Primary osteoarthritis of both feet 12/19/2015   Depression 12/19/2015   Peptic ulcer disease 12/19/2015   Hemorrhoids 12/19/2015   S/p bilateral carpal tunnel release 12/19/2015   Dyslipidemia  12/19/2015   Intermittent asthma without complication 12/19/2015   Environmental allergies 12/19/2015   Attention deficit hyperactivity disorder (ADHD) 12/19/2015   Ulcerative colitis (HCC) 12/18/2015   High risk medication use 12/18/2015   Need for hepatitis C screening test 07/12/2015   Need for prophylactic vaccination against Streptococcus pneumoniae (pneumococcus) 02/07/2015   Type 2 diabetes mellitus without complication, without long-term current use of insulin (HCC) 02/07/2015   Anemia 06/13/2013   Injury of hand, right 06/13/2013   Prediabetes 06/13/2013   Anxiety state 12/14/2012   Hyperlipidemia 12/14/2012   Hypertension, benign 12/14/2012   Breast mass 07/09/2012   Herpes zoster 06/19/2012   Abnormal mammogram 06/09/2012    Past Medical History:  Diagnosis Date   Arthritis    Asthma    High cholesterol    Hypertension    Pre-diabetes    Seasonal allergies     Family History  Problem Relation Age of Onset   Diabetes Mother    Hypertension Mother    Hyperlipidemia Mother    Dementia Mother    COPD Father    Arthritis Father    Thyroid disease Sister    Thyroid disease Sister    Cancer Sister        blood cancer   Asthma Sister    ADD / ADHD Son    Down syndrome Daughter    Past Surgical History:  Procedure Laterality Date   CARPAL TUNNEL RELEASE  unsure   bilaterally   CESAREAN SECTION  12/1999   CHOLECYSTECTOMY  12/1999   MENISCUS REPAIR Left 11/20/2021   PARTIAL MASTECTOMY WITH NEEDLE LOCALIZATION Right 07/09/2012   Procedure: RIGHT PARTIAL MASTECTOMY WITH NEEDLE LOCALIZATION;  Surgeon: Levert Ready, MD;  Location: [facility];  Service: General;  Laterality: Right;   SHOULDER OPEN ROTATOR CUFF REPAIR  2009   rt shoulder   TOTAL SHOULDER ARTHROPLASTY     Social History   Social History Narrative   Not on file   Immunization History  Administered Date(s) Administered   Influenza Split 09/14/2010, 11/09/2013, 10/14/2014,  10/10/2015, 10/09/2016, 11/10/2017   Influenza, Seasonal, Injecte, Preservative Fre 09/14/2010, 10/09/2016   Influenza,inj,Quad PF,6+ Mos 10/10/2015, 11/10/2017   Influenza,inj,Quad PF,6-35 Mos 01/12/2019   Influenza,inj,quad, With Preservative 11/10/2017   Influenza-Unspecified 11/09/2013, 10/14/2014   PFIZER(Purple Top)SARS-COV-2 Vaccination 03/17/2019, 04/13/2019, 11/12/2019   Pfizer Covid-19 Vaccine Bivalent Booster 27yrs & up 12/05/2020   Pneumococcal Conjugate-13 02/07/2015   Pneumococcal Polysaccharide-23 09/06/2013     Objective: Vital Signs: BP 137/83   Pulse 76   Resp 16   Ht 5\' 5"  (1.651 m)   Wt 197 lb (89.4 kg)   LMP 11/05/2011   BMI 32.78 kg/m    Physical Exam Vitals and nursing note reviewed.  Constitutional:      Appearance: She is well-developed.  HENT:     Head: Normocephalic and atraumatic.  Eyes:     Conjunctiva/sclera: Conjunctivae normal.  Cardiovascular:     Rate and Rhythm: Normal rate and regular rhythm.     Heart sounds: Normal heart sounds.  Pulmonary:     Effort: Pulmonary effort is normal.  Breath sounds: Normal breath sounds.  Abdominal:     General: Bowel sounds are normal.     Palpations: Abdomen is soft.  Musculoskeletal:     Cervical back: Normal range of motion.  Lymphadenopathy:     Cervical: No cervical adenopathy.  Skin:    General: Skin is warm and dry.     Capillary Refill: Capillary refill takes less than 2 seconds.  Neurological:     Mental Status: She is alert and oriented to person, place, and time.  Psychiatric:        Behavior: Behavior normal.      Musculoskeletal Exam: C-spine has limited range of motion with lateral Tatian.  No midline spinal tenderness.  No SI joint tenderness.  Shoulder joints, elbow joints, wrist joints, MCPs, PIPs, DIPs have good range of motion with no synovitis.  Thickening of the left first MCP joint.  PIP and DIP thickening noted.  Hip joints have good range of motion with no groin pain.   Knee joints have good range of motion no warmth or effusion.  Ankle joints have good range of motion no tenderness or joint swelling.  No evidence of Achilles tendinitis.  CDAI Exam: CDAI Score: -- Patient Global: --; Provider Global: -- Swollen: --; Tender: -- Joint Exam 01/28/2023   No joint exam has been documented for this visit   There is currently no information documented on the homunculus. Go to the Rheumatology activity and complete the homunculus joint exam.  Investigation: No additional findings.  Imaging: No results found.  Recent Labs: Lab Results  Component Value Date   WBC 8.6 11/27/2022   HGB 13.0 11/27/2022   PLT 325 11/27/2022   NA 140 11/27/2022   K 4.3 11/27/2022   CL 104 11/27/2022   CO2 28 11/27/2022   GLUCOSE 80 11/27/2022   BUN 12 11/27/2022   CREATININE 0.78 11/27/2022   BILITOT 0.4 11/27/2022   ALKPHOS 97 07/30/2016   AST 18 11/27/2022   ALT 23 11/27/2022   PROT 7.3 11/27/2022   ALBUMIN 4.1 07/30/2016   CALCIUM 9.4 11/27/2022   GFRAA 80 04/25/2020   QFTBGOLDPLUS NEGATIVE 08/26/2022    Speciality Comments: No specialty comments available.  Procedures:  No procedures performed Allergies: Other, Tape, and Tree extract    Assessment / Plan:     Visit Diagnoses: Other ulcerative colitis without complication (HCC) - Dr. Tova Fresh.  She had a routine colonoscopy on 03/05/2020: She has not had any signs or symptoms of an ulcerative colitis flare.  No abdominal pain, diarrhea, or blood in her stool.  Patient remains on Humira  40 mg subcutaneous injections every 14 days and methotrexate  4 tablets by mouth once weekly.  She is tolerating combination therapy without any side effects and has not had any interruptions in therapy.  Patient continues to have colonoscopies every 2 to 3 years performed by Dr. Tova Fresh.  She will notify us  if she starts to have more recurrent flares.  High risk medication use - Humira  40 mg sq injections every 14 days, methotrexate  4  tablets every 7 days, and folic acid  1 mg daily. CMP updated on 12/29/22. CBC updated on 11/27/22.  She will return for updated lab work at the end of February/early March 2025 and then every 3 months to monitor for drug toxicity.  Standing orders for CBC and CMP remain in place. TB gold negative on 08/26/22. No recurrent infections.  Discussed the importance of holding humira  and methotrexate  if she develops signs or symptoms  of an infection and to resume once the infection has completely cleared.   Sacroiliitis [facility]): No SI joint tenderness upon palpation.  She had a recent flare in her SI joints about 1 month ago which lasted for about 2 weeks.  Her symptoms have eased off.  Overall her symptoms have been well-controlled with the use of Humira  and methotrexate  as combination therapy.  She will notify us  if she starts to have recurrent flares.  Chronic pain of both shoulders - Dr. Dawna Etienne has good range of motion with no discomfort.  Primary osteoarthritis of both hands: She has PIP and DIP thickening consistent with osteoarthritis of both hands.  No active inflammation noted on examination today.  Discussed the importance of joint protection and muscle strengthening.  Primary osteoarthritis of both knees: She has good range of motion of both knee joints on examination today.  No warmth or effusion noted.  Primary osteoarthritis of both feet: Patient's been experiencing intermittent discomfort in her feet.  Her discomfort has been exacerbated by standing or walking for prolonged periods of time.  Patient has been resting over the past few weeks and her foot pain has eased off.  She is scheduled to see podiatry next week for further evaluation.  S/p bilateral carpal tunnel release - Dr. Elia Groom currently symptomatic.  DDD (degenerative disc disease), cervical: Followed by Dr. Rexanne Catalina. C-spine has slightly limited range of motion.  No symptoms of radiculopathy.  Other medical conditions are  listed as follows:   History of peptic ulcer disease  History of asthma  History of attention deficit disorder  History of cholecystectomy  Coarse tremors  Vitamin B12 deficiency  Orders: No orders of the defined types were placed in this encounter.  No orders of the defined types were placed in this encounter.     Follow-Up Instructions: Return in about 5 months (around 06/28/2023) for UC, Osteoarthritis.   Romayne Clubs, PA-C  Note - This record has been created using Dragon software.  Chart creation errors have been sought, but may not always  have been located. Such creation errors do not reflect on  the standard of medical care.

## 2023-01-28 ENCOUNTER — Encounter: Payer: Self-pay | Admitting: Physician Assistant

## 2023-01-28 ENCOUNTER — Ambulatory Visit: Payer: BC Managed Care – PPO | Attending: Physician Assistant | Admitting: Physician Assistant

## 2023-01-28 VITALS — BP 137/83 | HR 76 | Resp 16 | Ht 65.0 in | Wt 197.0 lb

## 2023-01-28 DIAGNOSIS — M19042 Primary osteoarthritis, left hand: Secondary | ICD-10-CM

## 2023-01-28 DIAGNOSIS — K5180 Other ulcerative colitis without complications: Secondary | ICD-10-CM

## 2023-01-28 DIAGNOSIS — M170 Bilateral primary osteoarthritis of knee: Secondary | ICD-10-CM

## 2023-01-28 DIAGNOSIS — M19041 Primary osteoarthritis, right hand: Secondary | ICD-10-CM

## 2023-01-28 DIAGNOSIS — M5030 Other cervical disc degeneration, unspecified cervical region: Secondary | ICD-10-CM

## 2023-01-28 DIAGNOSIS — M25511 Pain in right shoulder: Secondary | ICD-10-CM | POA: Diagnosis not present

## 2023-01-28 DIAGNOSIS — M19071 Primary osteoarthritis, right ankle and foot: Secondary | ICD-10-CM

## 2023-01-28 DIAGNOSIS — E538 Deficiency of other specified B group vitamins: Secondary | ICD-10-CM

## 2023-01-28 DIAGNOSIS — Z79899 Other long term (current) drug therapy: Secondary | ICD-10-CM | POA: Diagnosis not present

## 2023-01-28 DIAGNOSIS — G8929 Other chronic pain: Secondary | ICD-10-CM

## 2023-01-28 DIAGNOSIS — Z8709 Personal history of other diseases of the respiratory system: Secondary | ICD-10-CM

## 2023-01-28 DIAGNOSIS — Z9049 Acquired absence of other specified parts of digestive tract: Secondary | ICD-10-CM

## 2023-01-28 DIAGNOSIS — Z9889 Other specified postprocedural states: Secondary | ICD-10-CM

## 2023-01-28 DIAGNOSIS — M461 Sacroiliitis, not elsewhere classified: Secondary | ICD-10-CM | POA: Diagnosis not present

## 2023-01-28 DIAGNOSIS — G252 Other specified forms of tremor: Secondary | ICD-10-CM

## 2023-01-28 DIAGNOSIS — Z8659 Personal history of other mental and behavioral disorders: Secondary | ICD-10-CM

## 2023-01-28 DIAGNOSIS — Z8711 Personal history of peptic ulcer disease: Secondary | ICD-10-CM

## 2023-01-28 DIAGNOSIS — M19072 Primary osteoarthritis, left ankle and foot: Secondary | ICD-10-CM

## 2023-01-28 DIAGNOSIS — M25512 Pain in left shoulder: Secondary | ICD-10-CM

## 2023-01-28 NOTE — Patient Instructions (Signed)
 Standing Labs We placed an order today for your standing lab work.   Please have your standing labs drawn at end of February and every 3 months   Please have your labs drawn 2 weeks prior to your appointment so that the provider can discuss your lab results at your appointment, if possible.  Please note that you may see your imaging and lab results in MyChart before we have reviewed them. We will contact you once all results are reviewed. Please allow our office up to 72 hours to thoroughly review all of the results before contacting the office for clarification of your results.  WALK-IN LAB HOURS  Monday through Thursday from 8:00 am -12:30 pm and 1:00 pm-5:00 pm and Friday from 8:00 am-12:00 pm.  Patients with office visits requiring labs will be seen before walk-in labs.  You may encounter longer than normal wait times. Please allow additional time. Wait times may be shorter on  Monday and Thursday afternoons.  We do not book appointments for walk-in labs. We appreciate your patience and understanding with our staff.   Labs are drawn by [facility]. Please bring your co-pay at the time of your lab draw.  You may receive a bill from [facility] for your lab work.  Please note if you are on Hydroxychloroquine and and an order has been placed for a Hydroxychloroquine level,  you will need to have it drawn 4 hours or more after your last dose.  If you wish to have your labs drawn at another location, please call the office 24 hours in advance so we can fax the orders.  The office is located at [address], [address], [place], [place] [address]   If you have any questions regarding directions or hours of operation,  please call [phone].   As a reminder, please drink plenty of water prior to coming for your lab work. Thanks!

## 2023-02-05 ENCOUNTER — Ambulatory Visit: Payer: BC Managed Care – PPO | Admitting: Podiatry

## 2023-02-12 ENCOUNTER — Telehealth: Payer: Self-pay | Admitting: *Deleted

## 2023-02-12 NOTE — Telephone Encounter (Signed)
Can you please check on patient's bill? Patient stated she has received a final notice. Bill stated provider not in network. Please advise.Thank you.

## 2023-02-17 ENCOUNTER — Ambulatory Visit: Payer: BC Managed Care – PPO | Admitting: Podiatry

## 2023-02-17 ENCOUNTER — Other Ambulatory Visit: Payer: Self-pay | Admitting: Rheumatology

## 2023-02-17 DIAGNOSIS — K5180 Other ulcerative colitis without complications: Secondary | ICD-10-CM

## 2023-02-17 NOTE — Telephone Encounter (Signed)
 Last Fill: 11/25/2022  Labs: 11/27/2022 CBC and CMP are normal.   TB Gold: 08/26/2022 Neg    Next Visit: 06/30/2023  Last Visit: 01/28/2023  DX: Other ulcerative colitis without complication    Current Dose per office note 01/28/2023: Humira  40 mg sq injections every 14 days   Okay to refill Humira ?

## 2023-02-24 ENCOUNTER — Ambulatory Visit (INDEPENDENT_AMBULATORY_CARE_PROVIDER_SITE_OTHER): Payer: BC Managed Care – PPO

## 2023-02-24 ENCOUNTER — Encounter: Payer: Self-pay | Admitting: Podiatry

## 2023-02-24 ENCOUNTER — Ambulatory Visit (INDEPENDENT_AMBULATORY_CARE_PROVIDER_SITE_OTHER): Payer: BC Managed Care – PPO | Admitting: Podiatry

## 2023-02-24 DIAGNOSIS — M722 Plantar fascial fibromatosis: Secondary | ICD-10-CM

## 2023-02-24 DIAGNOSIS — M7752 Other enthesopathy of left foot: Secondary | ICD-10-CM | POA: Diagnosis not present

## 2023-02-24 DIAGNOSIS — M7751 Other enthesopathy of right foot: Secondary | ICD-10-CM | POA: Diagnosis not present

## 2023-02-24 DIAGNOSIS — M778 Other enthesopathies, not elsewhere classified: Secondary | ICD-10-CM

## 2023-02-24 MED ORDER — MELOXICAM 15 MG PO TABS: 15.0000 mg | ORAL_TABLET | Freq: Every day | ORAL | 3 refills | Status: DC

## 2023-02-24 MED ORDER — METHYLPREDNISOLONE 4 MG PO TBPK: ORAL_TABLET | ORAL | 0 refills | Status: DC

## 2023-02-25 NOTE — Progress Notes (Signed)
She presents today with some bilateral arch pain she states that this gets cramps in the arch occasionally but still has some pain in the heels.  She started to get some pain in the dorsal and dorsal lateral aspect of the left foot she states is aching and is throbby has been going on for about 3 months now she states has been doing lots of walking which causes an increase in the pain particularly on the next day.  Objective: Vital signs stable alert oriented x 3.  There is no erythema edema cellulitis drainage or odor no ecchymosis.  Of she does have tenderness on palpation of the third fourth metatarsal base area.  She has some tenderness on frontal plane range of motion.  She still has pain on palpation medial calcaneal tubercles bilateral.  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  Plan: Offered her an injection to the bilateral heels she declined.  Started her on methylprednisolone to be followed by meloxicam follow-up with her in 6 weeks if not improved.

## 2023-03-15 ENCOUNTER — Other Ambulatory Visit: Payer: Self-pay | Admitting: Physician Assistant

## 2023-03-16 NOTE — Telephone Encounter (Signed)
 Last Fill: 01/13/2023  Labs: 11/27/2022 CBC and CMP are normal.   Next Visit: 06/30/2023  Last Visit: 01/28/2023  DX: Other ulcerative colitis without complication   Current Dose per office note 01/28/2023: methotrexate 4 tablets every 7 days   Patient advised she is due to update her lab work. Patient states she will come to the office to update.   Okay to refill Methotrexate?

## 2023-03-17 ENCOUNTER — Other Ambulatory Visit: Payer: Self-pay

## 2023-03-17 DIAGNOSIS — Z79899 Other long term (current) drug therapy: Secondary | ICD-10-CM

## 2023-03-18 LAB — CBC WITH DIFFERENTIAL/PLATELET
Absolute Lymphocytes: 3212 {cells}/uL (ref 850–3900)
Absolute Monocytes: 607 {cells}/uL (ref 200–950)
Basophils Absolute: 62 {cells}/uL (ref 0–200)
Basophils Relative: 0.7 %
Eosinophils Absolute: 211 {cells}/uL (ref 15–500)
Eosinophils Relative: 2.4 %
HCT: 38 % (ref 35.0–45.0)
Hemoglobin: 12.5 g/dL (ref 11.7–15.5)
MCH: 30.7 pg (ref 27.0–33.0)
MCHC: 32.9 g/dL (ref 32.0–36.0)
MCV: 93.4 fL (ref 80.0–100.0)
MPV: 10.2 fL (ref 7.5–12.5)
Monocytes Relative: 6.9 %
Neutro Abs: 4708 {cells}/uL (ref 1500–7800)
Neutrophils Relative %: 53.5 %
Platelets: 287 10*3/uL (ref 140–400)
RBC: 4.07 10*6/uL (ref 3.80–5.10)
RDW: 12.8 % (ref 11.0–15.0)
Total Lymphocyte: 36.5 %
WBC: 8.8 10*3/uL (ref 3.8–10.8)

## 2023-03-18 LAB — COMPLETE METABOLIC PANEL WITH GFR
AG Ratio: 1.5 (calc) (ref 1.0–2.5)
ALT: 19 U/L (ref 6–29)
AST: 19 U/L (ref 10–35)
Albumin: 4.2 g/dL (ref 3.6–5.1)
Alkaline phosphatase (APISO): 86 U/L (ref 37–153)
BUN: 12 mg/dL (ref 7–25)
CO2: 26 mmol/L (ref 20–32)
Calcium: 9.5 mg/dL (ref 8.6–10.4)
Chloride: 106 mmol/L (ref 98–110)
Creat: 0.85 mg/dL (ref 0.50–1.03)
Globulin: 2.8 g/dL (ref 1.9–3.7)
Glucose, Bld: 86 mg/dL (ref 65–99)
Potassium: 4.2 mmol/L (ref 3.5–5.3)
Sodium: 140 mmol/L (ref 135–146)
Total Bilirubin: 0.4 mg/dL (ref 0.2–1.2)
Total Protein: 7 g/dL (ref 6.1–8.1)
eGFR: 79 mL/min/{1.73_m2} (ref >=60)

## 2023-03-18 NOTE — Progress Notes (Signed)
 CBC and CMP normal

## 2023-04-07 ENCOUNTER — Ambulatory Visit: Payer: BC Managed Care – PPO | Admitting: Podiatry

## 2023-04-09 ENCOUNTER — Ambulatory Visit: Payer: BC Managed Care – PPO | Admitting: Neurology

## 2023-04-24 ENCOUNTER — Telehealth: Payer: Self-pay | Admitting: Pharmacist

## 2023-04-24 NOTE — Telephone Encounter (Signed)
 Submitted a Prior Authorization RENEWAL request to [facility] for HUMIRA via CoverMyMeds. Will update once we receive a response.  Key: [id number]

## 2023-04-27 NOTE — Telephone Encounter (Signed)
 Received notification from [facility] regarding a prior authorization for HUMIRA. Authorization has been APPROVED from 04/24/2023 to 04/23/2024. Approval letter sent to scan center.  Authorization # [id number]  Geraldene Kleine, PharmD, MPH, BCPS, CPP Clinical Pharmacist (Rheumatology and Pulmonology)

## 2023-05-11 ENCOUNTER — Other Ambulatory Visit: Payer: Self-pay | Admitting: Rheumatology

## 2023-05-11 DIAGNOSIS — K5180 Other ulcerative colitis without complications: Secondary | ICD-10-CM

## 2023-05-11 NOTE — Telephone Encounter (Signed)
 Last Fill: 02/17/2023  Labs: 03/17/2023 CBC and CMP normal.   TB Gold: 08/26/2022 Neg    Next Visit: 06/30/2023  Last Visit: 01/28/2023  ZO:[id number] ulcerative colitis without complication   Current Dose per office note 01/28/2023: Humira  40 mg sq injections every 14 days   Okay to refill Humira ?

## 2023-05-17 ENCOUNTER — Other Ambulatory Visit: Payer: Self-pay | Admitting: Rheumatology

## 2023-06-03 NOTE — Progress Notes (Unsigned)
 Office Visit Note  Patient: Mercedes [name]             Date of Birth: [birth year]           MRN: [id number]             PCP: Bentley Bray, PA Referring: Bentley Bray, PA Visit Date: 06/17/2023 Occupation: @GUAROCC @  Subjective:  Medication monitoring  History of Present Illness: Mercedes [name] is a 58 y.o. female with history of ulcerative colitis and osteoarthritis.  Patient remains on Humira  40 mg sq injections every 14 days, methotrexate  4 tablets every 7 days, and folic acid  1 mg daily.  She is tolerating combination therapy without any side effects and has not had any recent gaps in therapy.  Patient denies any signs or symptoms of an ulcerative colitis flare.  Patient recently was treated with a 72-month course of meloxicam  15 mg 1 tablet daily by her podiatrist to help manage her symptoms from plantar fasciitis involving the left foot.  Patient states that while taking meloxicam  her joint pain and stiffness resolved.  Patient states that since discontinuing meloxicam  she has had recurrence of SI joint pain bilaterally as well as increased generalized arthralgias.  Patient was curious if she will be able to continue meloxicam  long-term and if so would like us  to take over the prescription.  Patient states that she was tolerating meloxicam  without any flares of ulcerative colitis.     Activities of Daily Living:  Patient reports morning stiffness for 30 minutes.   Patient Denies nocturnal pain.  Difficulty dressing/grooming: Denies Difficulty climbing stairs: Reports Difficulty getting out of chair: Reports Difficulty using hands for taps, buttons, cutlery, and/or writing: Denies  Review of Systems  Constitutional:  Negative for fatigue.  HENT:  Negative for mouth sores and mouth dryness.   Eyes:  Negative for dryness.  Respiratory:  Negative for shortness of breath.   Cardiovascular:  Negative for chest pain and palpitations.  Gastrointestinal:  Negative for blood in stool,  constipation and diarrhea.  Endocrine: Negative for increased urination.  Genitourinary:  Negative for involuntary urination.  Musculoskeletal:  Positive for joint pain, gait problem, joint pain, joint swelling, myalgias, morning stiffness and myalgias. Negative for muscle weakness and muscle tenderness.  Skin:  Negative for color change, rash, hair loss and sensitivity to sunlight.  Allergic/Immunologic: Positive for susceptible to infections.  Neurological:  Negative for dizziness and headaches.  Hematological:  Negative for swollen glands.  Psychiatric/Behavioral:  Positive for sleep disturbance. Negative for depressed mood. The patient is not nervous/anxious.     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   History of cholecystectomy 07/30/2016   Diabetes mellitus (HCC) 01/29/2016   Impetigo 01/09/2016   Anniversary reaction 12/20/2015   Sacroiliitis (HCC) 12/19/2015   Primary osteoarthritis of both knees 12/19/2015   Primary osteoarthritis of both feet 12/19/2015   Depression 12/19/2015  Peptic ulcer disease 12/19/2015   Hemorrhoids 12/19/2015   S/p bilateral carpal tunnel release 12/19/2015   Dyslipidemia 12/19/2015   Intermittent asthma without complication 12/19/2015   Environmental allergies 12/19/2015   Attention deficit hyperactivity disorder (ADHD) 12/19/2015   Ulcerative colitis (HCC)  12/18/2015   High risk medication use 12/18/2015   Need for hepatitis C screening test 07/12/2015   Need for prophylactic vaccination against Streptococcus pneumoniae (pneumococcus) 02/07/2015   Type 2 diabetes mellitus without complication, without long-term current use of insulin (HCC) 02/07/2015   Anemia 06/13/2013   Injury of hand, right 06/13/2013   Prediabetes 06/13/2013   Anxiety state 12/14/2012   Hyperlipidemia 12/14/2012   Hypertension, benign 12/14/2012   Breast mass 07/09/2012   Herpes zoster 06/19/2012   Abnormal mammogram 06/09/2012    Past Medical History:  Diagnosis Date   Arthritis    Asthma    High cholesterol    Hypertension    Pre-diabetes    Seasonal allergies     Family History  Problem Relation Age of Onset   Diabetes Mother    Hypertension Mother    Hyperlipidemia Mother    Dementia Mother    COPD Father    Arthritis Father    Thyroid disease Sister    Thyroid disease Sister    Cancer Sister        blood cancer   Asthma Sister    ADD / ADHD Son    Down syndrome Daughter    Past Surgical History:  Procedure Laterality Date   CARPAL TUNNEL RELEASE  unsure   bilaterally   CESAREAN SECTION  12/1999   CHOLECYSTECTOMY  12/1999   MENISCUS REPAIR Left 11/20/2021   PARTIAL MASTECTOMY WITH NEEDLE LOCALIZATION Right 07/09/2012   Procedure: RIGHT PARTIAL MASTECTOMY WITH NEEDLE LOCALIZATION;  Surgeon: Levert Ready, MD;  Location: [facility];  Service: General;  Laterality: Right;   SHOULDER OPEN ROTATOR CUFF REPAIR  2009   rt shoulder   TOTAL SHOULDER ARTHROPLASTY     Social History   Social History Narrative   Not on file   Immunization History  Administered Date(s) Administered   Influenza Split 09/14/2010, 11/09/2013, 10/14/2014, 10/10/2015, 10/09/2016, 11/10/2017   Influenza, Quadrivalent, Recombinant, Inj, Pf 10/09/2016   Influenza, Seasonal, Injecte, Preservative Fre 09/14/2010, 10/09/2016   Influenza,inj,Quad PF,6+  Mos 10/10/2015, 11/10/2017, 01/12/2019   Influenza,inj,Quad PF,6-35 Mos 01/12/2019   Influenza,inj,quad, With Preservative 11/10/2017   Influenza-Unspecified 09/14/2010, 11/09/2013, 10/14/2014, 10/10/2015, 10/09/2016, 11/10/2017, 10/15/2022   PFIZER(Purple Top)SARS-COV-2 Vaccination 03/17/2019, 04/13/2019, 11/12/2019   Pfizer Covid-19 Vaccine Bivalent Booster 48yrs & up 12/05/2020   Pneumococcal Conjugate-13 02/07/2015   Pneumococcal Polysaccharide-23 09/06/2013     Objective: Vital Signs: BP 115/79 (BP Location: Left Arm, Patient Position: Sitting, Cuff Size: Normal)   Pulse 84   Ht 5\' 5"  (1.651 m)   Wt 203 lb 6.4 oz (92.3 kg)   LMP 11/05/2011   BMI 33.85 kg/m    Physical Exam Vitals and nursing note reviewed.  Constitutional:      Appearance: She is well-developed.  HENT:     Head: Normocephalic and atraumatic.  Eyes:     Conjunctiva/sclera: Conjunctivae normal.  Cardiovascular:     Rate and Rhythm: Normal rate and regular rhythm.     Heart sounds: Normal heart sounds.  Pulmonary:     Effort: Pulmonary effort is normal.     Breath sounds: Normal breath sounds.  Abdominal:     General: Bowel sounds are normal.  Palpations: Abdomen is soft.  Musculoskeletal:     Cervical back: Normal range of motion.  Lymphadenopathy:     Cervical: No cervical adenopathy.  Skin:    General: Skin is warm and dry.     Capillary Refill: Capillary refill takes less than 2 seconds.  Neurological:     Mental Status: She is alert and oriented to person, place, and time.  Psychiatric:        Behavior: Behavior normal.      Musculoskeletal Exam: C-spine has limited range of motion without rotation.  No midline spinal tenderness.  Tenderness over both SI joints noted.  Shoulder joints, elbow joints, wrist joints, MCPs, PIPs, DIPs have good range of motion with no synovitis.  Tenderness along the right elbow joint line noted.  Thickening of the left first MCP joint.  PIP and DIP thickening  consistent with osteoarthritis of both hands.  Hip joints have good range of motion with no groin pain.  Knee joints have good range of motion no warmth or effusion.  Ankle joints have good range of motion with no tenderness or joint swelling.  No evidence of Achilles tendinitis.  Mild tenderness along the plantar fascia of the left foot.  CDAI Exam: CDAI Score: -- Patient Global: --; Provider Global: -- Swollen: 0 ; Tender: 3  Joint Exam 06/17/2023      Right  Left  Elbow   Tender     Sacroiliac   Tender   Tender     Investigation: No additional findings.  Imaging: No results found.  Recent Labs: Lab Results  Component Value Date   WBC 8.8 03/17/2023   HGB 12.5 03/17/2023   PLT 287 03/17/2023   NA 140 03/17/2023   K 4.2 03/17/2023   CL 106 03/17/2023   CO2 26 03/17/2023   GLUCOSE 86 03/17/2023   BUN 12 03/17/2023   CREATININE 0.85 03/17/2023   BILITOT 0.4 03/17/2023   ALKPHOS 97 07/30/2016   AST 19 03/17/2023   ALT 19 03/17/2023   PROT 7.0 03/17/2023   ALBUMIN 4.1 07/30/2016   CALCIUM 9.5 03/17/2023   GFRAA 80 04/25/2020   QFTBGOLDPLUS NEGATIVE 08/26/2022    Speciality Comments: No specialty comments available.  Procedures:  No procedures performed Allergies: Other, Tape, and Tree extract   Assessment / Plan:     Visit Diagnoses: Other ulcerative colitis without complication (HCC) - Dr. Tova Fresh.  She had a routine colonoscopy on 03/05/2020: She has not had any signs or symptoms of an ulcerative colitis flare.  Patient remains on Humira  40 mg subcutaneous injections every 14 days and methotrexate  4 tablets by mouth once weekly.  She has not had any recent gaps in therapy. No signs or symptoms of a flare. Patient was recently prescribed a 34-month prescription for meloxicam  15 mg daily and did not notice any new or worsening GI symptoms while taking meloxicam .  In the past she has been advised by GI to avoid the use of NSAIDs but did not notice any signs or symptoms of  a flare.  Polyarthralgia: Generalized arthralgias and joint stiffness--tenderness along the right elbow joint line, tenderness over both SI joints, recurrent plantar fasciitis of the left foot: Patient has remained on Humira  40 mg of days injections every 14 days and methotrexate  4 tablets by mouth once weekly.  She has not had any recent gaps in therapy. No synovitis or dactylitis was noted on examination today. Patient was recently treated with a 3 months course of meloxicam  15  mg 1 tablet daily by podiatry to help to alleviate the discomfort in the plantar fascia of her left foot.  While taking meloxicam  she notices significant improvement in her generalized arthralgias and mobility.  Patient was able to rise from a seated position without difficulty and was able to perform more activities with less discomfort.  Her SI joint pain improved from a 4 out of 10 on a daily basis down to a 0-1 out of 10 while taking meloxicam . Discussed the importance of differentiating inflammatory arthritis versus wear-and-tear osteoarthritis to determine appropriate treatment options. Discussed the concern for long-term meloxicam  use including GI side effects and drug toxicity with concurrent use of methotrexate .  She did not notice any ulcerative colitis flares while taking meloxicam .  She was tolerating meloxicam  without any side effects and would like to continue meloxicam  use if possible. Plan to check sed rate and CRP today.  If inflammatory markers are elevated could consider increasing the dose of methotrexate .  If inflammatory markers are normal the patient would like to reinitiate meloxicam .  Discussed the option of taking meloxicam  sparingly or at a reduced dose of 7.5 mg as needed for symptomatic relief.  She was in agreement.  Medication changes will be made pending lab results today.  High risk medication use - Humira  40 mg sq injections every 14 days, methotrexate  4 tablets every 7 days, and folic acid  1 mg  daily. CBC and CMP updated on 03/17/23. Orders for CBC and CMP released today.  Her next lab work will be due in September and every 3 months to monitor for drug toxicity. TB gold negative on 08/26/22.  Future order for TB gold placed today.  No recent or recurrent infections.  Discussed the importance of holding humira  and methotrexate  if she develops signs or symptoms of an infection and to resume once the infection has completely cleared.   - Plan: CBC with Differential/Platelet, Comprehensive metabolic panel with GFR  Screening for tuberculosis: Future order for TB gold placed today.   Sacroiliitis (HCC) - Chronic pain.  Bilateral SI joint tenderness noted on upon palpation today.  4/10 daily on the pain scale.  Improved to 1/10 pain while taking meloxicam .   Plan to check ESR and CRP today.   Plan: Sedimentation rate, C-reactive protein  Chronic pain of both shoulders - Dr. Verba Girt ROM with no discomfort or tenderness at this time.   Pain in right elbow: Patient has tenderness along the right elbow joint line--previously resolved while taking meloxicam .  Primary osteoarthritis of both hands: Mild PIP and DIP prominence-no synovitis noted.  Complete fist formation bilaterally.  No synovitis or dactylitis noted on examination today.  The discomfort and stiffness in her hands resolved while taking meloxicam .  S/p bilateral carpal tunnel release - Dr. Elia Groom currently symptomatic.   Primary osteoarthritis of both knees: She has good ROM of both knee joints on examination today. No warmth or effusion noted.    Primary osteoarthritis of both feet: She has good range of motion of both ankle joints with no tenderness or synovitis on exam.  No tenderness or synovitis over MTP joints.  She has occasional discomfort over the tailor bunion of the right foot.  She has also had discomfort due to plantar fasciitis involving the left foot in the past.  Her podiatrist previously prescribed meloxicam   15 mg 1 tablet daily x 3 months to try to alleviate the discomfort in the plantar fascia.  She notices significant improvement in her symptoms from plantar  fasciitis and generalized arthralgias. Discussed the concerns with long-term meloxicam  use especially with concurrent use of methotrexate .  Discussed that meloxicam  can increase the concentration of methotrexate  leading to an increased risk for toxicity.  Also discussed the potential risk of GI side effects with long-term use of meloxicam  especially if she is not taking it with food. Plan to check sed rate and CRP today.  Plantar fasciitis of left foot: Under the care of podiatry.  Treated with meloxicam  15 mg 1 tablet daily x 3 months.  Improved.  DDD (degenerative disc disease), cervical: C-spine has limited ROM with lateral rotation.   Other medical conditions are listed as follows:  History of peptic ulcer disease  History of asthma  History of attention deficit disorder  History of cholecystectomy  Coarse tremors  Vitamin B12 deficiency  Orders: Orders Placed This Encounter  Procedures   CBC with Differential/Platelet   Comprehensive metabolic panel with GFR   Sedimentation rate   C-reactive protein   QuantiFERON-TB Gold Plus   Meds ordered this encounter  Medications   methotrexate  (RHEUMATREX) 2.5 MG tablet    Sig: Take 4 tablets by mouth once weekly. Caution chemotherapy medication, protect from light.    Dispense:  48 tablet    Refill:  0    Follow-Up Instructions: Return in about 5 months (around 11/17/2023) for UC.   Romayne Clubs, PA-C  Note - This record has been created using Dragon software.  Chart creation errors have been sought, but may not always  have been located. Such creation errors do not reflect on  the standard of medical care.

## 2023-06-17 ENCOUNTER — Ambulatory Visit: Attending: Physician Assistant | Admitting: Physician Assistant

## 2023-06-17 ENCOUNTER — Encounter: Payer: Self-pay | Admitting: Physician Assistant

## 2023-06-17 VITALS — BP 115/79 | HR 84 | Ht 65.0 in | Wt 203.4 lb

## 2023-06-17 DIAGNOSIS — M5030 Other cervical disc degeneration, unspecified cervical region: Secondary | ICD-10-CM

## 2023-06-17 DIAGNOSIS — M25512 Pain in left shoulder: Secondary | ICD-10-CM

## 2023-06-17 DIAGNOSIS — Z9889 Other specified postprocedural states: Secondary | ICD-10-CM

## 2023-06-17 DIAGNOSIS — M461 Sacroiliitis, not elsewhere classified: Secondary | ICD-10-CM

## 2023-06-17 DIAGNOSIS — M25521 Pain in right elbow: Secondary | ICD-10-CM

## 2023-06-17 DIAGNOSIS — G8929 Other chronic pain: Secondary | ICD-10-CM

## 2023-06-17 DIAGNOSIS — M19071 Primary osteoarthritis, right ankle and foot: Secondary | ICD-10-CM

## 2023-06-17 DIAGNOSIS — M722 Plantar fascial fibromatosis: Secondary | ICD-10-CM

## 2023-06-17 DIAGNOSIS — G252 Other specified forms of tremor: Secondary | ICD-10-CM

## 2023-06-17 DIAGNOSIS — E538 Deficiency of other specified B group vitamins: Secondary | ICD-10-CM

## 2023-06-17 DIAGNOSIS — M19042 Primary osteoarthritis, left hand: Secondary | ICD-10-CM

## 2023-06-17 DIAGNOSIS — M25511 Pain in right shoulder: Secondary | ICD-10-CM | POA: Diagnosis not present

## 2023-06-17 DIAGNOSIS — M19041 Primary osteoarthritis, right hand: Secondary | ICD-10-CM

## 2023-06-17 DIAGNOSIS — Z9049 Acquired absence of other specified parts of digestive tract: Secondary | ICD-10-CM

## 2023-06-17 DIAGNOSIS — Z8709 Personal history of other diseases of the respiratory system: Secondary | ICD-10-CM

## 2023-06-17 DIAGNOSIS — Z111 Encounter for screening for respiratory tuberculosis: Secondary | ICD-10-CM

## 2023-06-17 DIAGNOSIS — M170 Bilateral primary osteoarthritis of knee: Secondary | ICD-10-CM

## 2023-06-17 DIAGNOSIS — K5180 Other ulcerative colitis without complications: Secondary | ICD-10-CM | POA: Diagnosis not present

## 2023-06-17 DIAGNOSIS — M19072 Primary osteoarthritis, left ankle and foot: Secondary | ICD-10-CM

## 2023-06-17 DIAGNOSIS — Z79899 Other long term (current) drug therapy: Secondary | ICD-10-CM | POA: Diagnosis not present

## 2023-06-17 DIAGNOSIS — Z8659 Personal history of other mental and behavioral disorders: Secondary | ICD-10-CM

## 2023-06-17 DIAGNOSIS — M2550 Pain in unspecified joint: Secondary | ICD-10-CM

## 2023-06-17 DIAGNOSIS — Z8711 Personal history of peptic ulcer disease: Secondary | ICD-10-CM

## 2023-06-17 MED ORDER — METHOTREXATE SODIUM 2.5 MG PO TABS: ORAL_TABLET | ORAL | 0 refills | Status: DC

## 2023-06-17 NOTE — Patient Instructions (Signed)
 Standing Labs We placed an order today for your standing lab work.   Please have your standing labs drawn in August and every 3 months   Please have your labs drawn 2 weeks prior to your appointment so that the provider can discuss your lab results at your appointment, if possible.  Please note that you may see your imaging and lab results in MyChart before we have reviewed them. We will contact you once all results are reviewed. Please allow our office up to 72 hours to thoroughly review all of the results before contacting the office for clarification of your results.  WALK-IN LAB HOURS  Monday through Thursday from 8:00 am -12:30 pm and 1:00 pm-4:00 pm and Friday from 8:00 am-12:00 pm.  Patients with office visits requiring labs will be seen before walk-in labs.  You may encounter longer than normal wait times. Please allow additional time. Wait times may be shorter on  Monday and Thursday afternoons.  We do not book appointments for walk-in labs. We appreciate your patience and understanding with our staff.   Labs are drawn by [facility]. Please bring your co-pay at the time of your lab draw.  You may receive a bill from [facility] for your lab work.  Please note if you are on Hydroxychloroquine  and and an order has been placed for a Hydroxychloroquine  level,  you will need to have it drawn 4 hours or more after your last dose.  If you wish to have your labs drawn at another location, please call the office 24 hours in advance so we can fax the orders.  The office is located at [address], [address], [place], [place] [address]   If you have any questions regarding directions or hours of operation,  please call [phone].   As a reminder, please drink plenty of water prior to coming for your lab work. Thanks!

## 2023-06-18 ENCOUNTER — Ambulatory Visit: Payer: Self-pay | Admitting: Physician Assistant

## 2023-06-18 LAB — CBC WITH DIFFERENTIAL/PLATELET
Absolute Lymphocytes: 3466 {cells}/uL (ref 850–3900)
Absolute Monocytes: 768 {cells}/uL (ref 200–950)
Basophils Absolute: 38 {cells}/uL (ref 0–200)
Basophils Relative: 0.4 %
Eosinophils Absolute: 192 {cells}/uL (ref 15–500)
Eosinophils Relative: 2 %
HCT: 41 % (ref 35.0–45.0)
Hemoglobin: 13.1 g/dL (ref 11.7–15.5)
MCH: 30 pg (ref 27.0–33.0)
MCHC: 32 g/dL (ref 32.0–36.0)
MCV: 94 fL (ref 80.0–100.0)
MPV: 10.3 fL (ref 7.5–12.5)
Monocytes Relative: 8 %
Neutro Abs: 5136 {cells}/uL (ref 1500–7800)
Neutrophils Relative %: 53.5 %
Platelets: 314 10*3/uL (ref 140–400)
RBC: 4.36 10*6/uL (ref 3.80–5.10)
RDW: 13 % (ref 11.0–15.0)
Total Lymphocyte: 36.1 %
WBC: 9.6 10*3/uL (ref 3.8–10.8)

## 2023-06-18 LAB — COMPREHENSIVE METABOLIC PANEL WITH GFR
AG Ratio: 1.4 (calc) (ref 1.0–2.5)
ALT: 18 U/L (ref 6–29)
AST: 19 U/L (ref 10–35)
Albumin: 4.3 g/dL (ref 3.6–5.1)
Alkaline phosphatase (APISO): 92 U/L (ref 37–153)
BUN/Creatinine Ratio: 16 (calc) (ref 6–22)
BUN: 17 mg/dL (ref 7–25)
CO2: 28 mmol/L (ref 20–32)
Calcium: 9.6 mg/dL (ref 8.6–10.4)
Chloride: 103 mmol/L (ref 98–110)
Creat: 1.09 mg/dL — ABNORMAL HIGH (ref 0.50–1.03)
Globulin: 3 g/dL (ref 1.9–3.7)
Glucose, Bld: 90 mg/dL (ref 65–99)
Potassium: 4.3 mmol/L (ref 3.5–5.3)
Sodium: 139 mmol/L (ref 135–146)
Total Bilirubin: 0.4 mg/dL (ref 0.2–1.2)
Total Protein: 7.3 g/dL (ref 6.1–8.1)
eGFR: 59 mL/min/{1.73_m2} — ABNORMAL LOW (ref >=60)

## 2023-06-18 LAB — C-REACTIVE PROTEIN: CRP: <3 mg/L (ref <8.0)

## 2023-06-18 LAB — SEDIMENTATION RATE: Sed Rate: 11 mm/h (ref 0–30)

## 2023-06-18 NOTE — Progress Notes (Signed)
 Creatinine is elevated-1.09 and GFR is low-59. GFR has gradually been declining so NSAID use is discouraged.  Do not recommend reinitiating meloxicam .   CBC WNL ESR and CRP WNL.

## 2023-06-30 ENCOUNTER — Ambulatory Visit: Payer: BC Managed Care – PPO | Admitting: Physician Assistant

## 2023-07-06 MED ORDER — METHOTREXATE SODIUM 2.5 MG PO TABS
ORAL_TABLET | ORAL | 0 refills | Status: DC
Start: 2023-07-06 — End: 2023-09-15

## 2023-08-03 ENCOUNTER — Other Ambulatory Visit: Payer: Self-pay | Admitting: Physician Assistant

## 2023-08-03 DIAGNOSIS — K5180 Other ulcerative colitis without complications: Secondary | ICD-10-CM

## 2023-08-03 NOTE — Telephone Encounter (Signed)
 Last Fill: 05/11/2023  Labs: 07/02/2023 CMP WNL, 06/17/2023 CBC WNL   TB Gold: 08/26/2022 Neg    Next Visit: 11/18/2023  Last Visit: 06/17/2022  DX:Other ulcerative colitis without complication   Current Dose per office note 06/17/2023: Humira  40 mg sq injections every 14 days   Okay to refill Humira ?

## 2023-09-05 ENCOUNTER — Other Ambulatory Visit: Payer: Self-pay | Admitting: Physician Assistant

## 2023-09-15 ENCOUNTER — Other Ambulatory Visit: Payer: Self-pay

## 2023-09-15 DIAGNOSIS — K5180 Other ulcerative colitis without complications: Secondary | ICD-10-CM

## 2023-09-15 MED ORDER — HUMIRA (2 PEN) 40 MG/0.4ML ~~LOC~~ AJKT: 40.0000 mg | AUTO-INJECTOR | SUBCUTANEOUS | 0 refills | Status: DC

## 2023-09-15 MED ORDER — METHOTREXATE SODIUM 2.5 MG PO TABS: ORAL_TABLET | ORAL | 0 refills | Status: DC

## 2023-09-15 NOTE — Telephone Encounter (Signed)
 Last Fill: 08/03/2023  Labs: 07/02/2023 CMP WNL 06/17/2023 CBC  Creatinine is elevated-1.09 and GFR is low-59. GFR has gradually been declining so NSAID use is discouraged.  Do not recommend reinitiating meloxicam .   CBC WNL  ESR and CRP WNL.   TB Gold: 08/26/2023 TB Gold Negative   Next Visit: 11/18/2023  Last Visit: 06/17/2023  DX: Other ulcerative colitis without complication   Current Dose per office note 06/17/2023: Humira  40 mg sq injections every 14 days   Okay to refill Humira   Patient called the office for her Humira  advised that we needed updated CBC and a TB Gold. Patient stated she would be in on Thursday to get those labs.

## 2023-09-15 NOTE — Telephone Encounter (Signed)
 Last Fill: 07/06/2023  Labs: 07/02/2023 CMP WNL 06/17/2023 CBC  Creatinine is elevated-1.09 and GFR is low-59. GFR has gradually been declining so NSAID use is discouraged.  Do not recommend reinitiating meloxicam .   CBC WNL  ESR and CRP WNL.   Next Visit: 11/18/2023  Last Visit: 06/17/2023  DX: Other ulcerative colitis without complication   Current Dose per office note 06/17/2023: methotrexate  4 tablets every 7 days   Okay to refill Methotrexate ?  Patient called the office for her Methotrexate  advised that we needed updated CBC. Patient stated she would be in on Thursday to get those labs.

## 2023-10-27 ENCOUNTER — Other Ambulatory Visit: Payer: Self-pay | Admitting: Physician Assistant

## 2023-10-27 DIAGNOSIS — K5180 Other ulcerative colitis without complications: Secondary | ICD-10-CM

## 2023-11-05 NOTE — Progress Notes (Signed)
 Office Visit Note  Patient: Mercedes [name]             Date of Birth: [birth year]           MRN: [id number]             PCP: Duwayne Katheryn HERO, PA Referring: Duwayne Katheryn HERO, PA Visit Date: 11/18/2023 Occupation: Data Unavailable  Subjective:  Medical management  History of Present Illness: Mercedes [name] is a 58 y.o. female with ulcerative colitis and osteoarthritis.  She returns today after her last visit in June 2025.  She has not had a flare of inflammatory arthritis ulcerative colitis.  She has been on Humira  40 mg subcu every 14 days and methotrexate  4 tablets p.o. weekly along with folic acid  1 mg p.o. daily without any interruption.  She continues to have some discomfort in the trochanteric region and SI joints.  None of the other joints are painful.  She denies history of plantar fasciitis, Achilles tendinitis or uveitis.    Activities of Daily Living:  Patient reports morning stiffness for 30 minutes.   Patient Reports nocturnal pain.  Difficulty dressing/grooming: Denies Difficulty climbing stairs: Reports Difficulty getting out of chair: Reports Difficulty using hands for taps, buttons, cutlery, and/or writing: Reports  Review of Systems  Constitutional:  Positive for fatigue.  HENT:  Negative for mouth sores and mouth dryness.   Eyes:  Negative for dryness.  Respiratory:  Negative for shortness of breath.   Cardiovascular:  Negative for chest pain and palpitations.  Gastrointestinal:  Negative for blood in stool, constipation and diarrhea.  Endocrine: Negative for increased urination.  Genitourinary:  Negative for involuntary urination.  Musculoskeletal:  Positive for joint pain, gait problem, joint pain, joint swelling and morning stiffness. Negative for myalgias, muscle weakness, muscle tenderness and myalgias.  Skin:  Negative for color change, rash, hair loss and sensitivity to sunlight.  Allergic/Immunologic: Negative for susceptible to infections.  Neurological:   Negative for dizziness and headaches.  Hematological:  Negative for swollen glands.  Psychiatric/Behavioral:  Positive for sleep disturbance. Negative for depressed mood. The patient is not nervous/anxious.     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   History of cholecystectomy 07/30/2016   Diabetes mellitus (HCC) 01/29/2016   Impetigo 01/09/2016   Anniversary reaction 12/20/2015   Sacroiliitis 12/19/2015   Primary osteoarthritis of both knees 12/19/2015   Primary osteoarthritis of both feet 12/19/2015   Depression 12/19/2015   Peptic ulcer disease 12/19/2015   Hemorrhoids 12/19/2015   S/p bilateral carpal tunnel release 12/19/2015   Dyslipidemia 12/19/2015   Intermittent asthma without complication 12/19/2015   Environmental allergies 12/19/2015   Attention deficit hyperactivity disorder (ADHD) 12/19/2015   Ulcerative colitis (HCC) 12/18/2015   High risk medication use 12/18/2015   Need for hepatitis C screening test 07/12/2015   Need for prophylactic vaccination against Streptococcus pneumoniae (pneumococcus) 02/07/2015   Type  2 diabetes mellitus without complication, without long-term current use of insulin (HCC) 02/07/2015   Anemia 06/13/2013   Injury of hand, right 06/13/2013   Prediabetes 06/13/2013   Anxiety state 12/14/2012   Hyperlipidemia 12/14/2012   Hypertension, benign 12/14/2012    Breast mass 07/09/2012   Herpes zoster 06/19/2012   Abnormal mammogram 06/09/2012    Past Medical History:  Diagnosis Date   Arthritis    Asthma    High cholesterol    Hypertension    Pre-diabetes    Seasonal allergies     Family History  Problem Relation Age of Onset   Diabetes Mother    Hypertension Mother    Hyperlipidemia Mother    Dementia Mother    COPD Father    Arthritis Father    Thyroid disease Sister    Thyroid disease Sister    Cancer Sister        blood cancer   Asthma Sister    ADD / ADHD Son    Down syndrome Daughter    Past Surgical History:  Procedure Laterality Date   CARPAL TUNNEL RELEASE  unsure   bilaterally   CESAREAN SECTION  12/1999   CHOLECYSTECTOMY  12/1999   MENISCUS REPAIR Left 11/20/2021   PARTIAL MASTECTOMY WITH NEEDLE LOCALIZATION Right 07/09/2012   Procedure: RIGHT PARTIAL MASTECTOMY WITH NEEDLE LOCALIZATION;  Surgeon: Elon CHRISTELLA Pacini, MD;  Location: [facility];  Service: General;  Laterality: Right;   SHOULDER OPEN ROTATOR CUFF REPAIR  2009   rt shoulder   TOTAL SHOULDER ARTHROPLASTY     Social History   Tobacco Use   Smoking status: Former    Current packs/day: 0.00    Average packs/day: 1 pack/day for 4.0 years (4.0 ttl pk-yrs)    Types: Cigarettes    Start date: 01/14/1991    Quit date: 01/14/1995    Years since quitting: 28.8    Passive exposure: Never   Smokeless tobacco: Never  Vaping Use   Vaping status: Never Used  Substance Use Topics   Alcohol use: No   Drug use: No   Social History   Social History Narrative   Not on file     Immunization History  Administered Date(s) Administered   Influenza Split 09/14/2010, 11/09/2013, 10/14/2014, 10/10/2015, 10/09/2016, 11/10/2017   Influenza, Quadrivalent, Recombinant, Inj, Pf 10/09/2016   Influenza, Seasonal, Injecte, Preservative Fre 09/14/2010, 10/09/2016   Influenza,inj,Quad PF,6+ Mos 10/10/2015, 11/10/2017, 01/12/2019   Influenza,inj,Quad PF,6-35  Mos 01/12/2019   Influenza,inj,quad, With Preservative 11/10/2017   Influenza-Unspecified 09/14/2010, 11/09/2013, 10/14/2014, 10/10/2015, 10/09/2016, 11/10/2017, 10/15/2022   PFIZER(Purple Top)SARS-COV-2 Vaccination 03/17/2019, 04/13/2019, 11/12/2019   Pfizer Covid-19 Vaccine Bivalent Booster 22yrs & up 12/05/2020   Pneumococcal Conjugate-13 02/07/2015   Pneumococcal Polysaccharide-23 09/06/2013     Objective: Vital Signs: BP 115/78   Pulse 80   Temp 97.7 F (36.5 C)   Resp 15   Ht 5' 5 (1.651 m)   Wt 197 lb 6.4 oz (89.5 kg)   LMP 11/05/2011   BMI 32.85 kg/m    Physical Exam Vitals and nursing note reviewed.  Constitutional:      Appearance: She is well-developed.  HENT:     Head: Normocephalic and atraumatic.  Eyes:     Conjunctiva/sclera: Conjunctivae normal.  Cardiovascular:     Rate and Rhythm: Normal rate and regular rhythm.     Heart sounds: Normal heart sounds.  Pulmonary:     Effort: Pulmonary effort is normal.     Breath sounds: Normal  breath sounds.  Abdominal:     General: Bowel sounds are normal.     Palpations: Abdomen is soft.  Musculoskeletal:     Cervical back: Normal range of motion.  Lymphadenopathy:     Cervical: No cervical adenopathy.  Skin:    General: Skin is warm and dry.     Capillary Refill: Capillary refill takes less than 2 seconds.  Neurological:     Mental Status: She is alert and oriented to person, place, and time.  Psychiatric:        Behavior: Behavior normal.      Musculoskeletal Exam: Cervical, thoracic and lumbar spine were in good range of motion.  She had bilateral SI joint tenderness.  Shoulder joints, elbow joints, wrist joints, MCPs, PIPs and DIPs were in good range of motion with no synovitis.  Bilateral PIP and DIP thickening was noted.  Hip joints and knee joints were in good range of motion without any warmth swelling or effusion.  There was no tenderness over ankles or MTPs.   CDAI Exam: CDAI Score: -- Patient  Global: --; Provider Global: -- Swollen: --; Tender: -- Joint Exam 11/18/2023   No joint exam has been documented for this visit   There is currently no information documented on the homunculus. Go to the Rheumatology activity and complete the homunculus joint exam.  Investigation: No additional findings.  Imaging: No results found.  Recent Labs: Lab Results  Component Value Date   WBC 9.6 06/17/2023   HGB 13.1 06/17/2023   PLT 314 06/17/2023   NA 139 06/17/2023   K 4.3 06/17/2023   CL 103 06/17/2023   CO2 28 06/17/2023   GLUCOSE 90 06/17/2023   BUN 17 06/17/2023   CREATININE 1.09 (H) 06/17/2023   BILITOT 0.4 06/17/2023   ALKPHOS 97 07/30/2016   AST 19 06/17/2023   ALT 18 06/17/2023   PROT 7.3 06/17/2023   ALBUMIN 4.1 07/30/2016   CALCIUM 9.6 06/17/2023   GFRAA 80 04/25/2020   QFTBGOLDPLUS NEGATIVE 08/26/2022    Speciality Comments: No specialty comments available.  Procedures:  No procedures performed Allergies: Other, Tape, and Tree extract   Assessment / Plan:     Visit Diagnoses: Other ulcerative colitis without complication (HCC) - Dr. Kristie.  She had a routine colonoscopy on 03/05/2020: -Patient denies having a flare of ulcerative colitis in a long time.  She continues to be on the combination of Humira  and methotrexate  without any interruption.  Plan: adalimumab  (HUMIRA , 2 PEN,) 40 MG/0.4ML pen  Polyarthralgia -she continues to have some discomfort in the trochanteric region and over the SI joints.  None of the other joints are painful or swollen.  There is no history of plantar fasciitis, Achilles tendinitis or uveitis.  Prescription refill for Humira  was sent today.  Plan: adalimumab  (HUMIRA , 2 PEN,) 40 MG/0.4ML pen  High risk medication use - Humira  40 mg sq injections every 14 days, methotrexate  4 tablets every 7 days, and folic acid  1 mg daily. -June 17, 2023 CBC and CMP were stable except for creatinine elevated at 1.09.  She was on meloxicam  at that time.   She discontinued meloxicam .  Will check labs today.  TB Gold was negative on August 26, 2022.  Plan: CBC with Differential/Platelet, Comprehensive metabolic panel with GFR, QuantiFERON-TB Gold Plus.  Information regarding physician was placed in the AVS.  She was advised to hold Humira  and methotrexate  if she develops an infection resume after the infection resolves.  Annual skin examination to  screen for skin cancer was advised.  Use of sunscreen and sun protection was discussed.  Sacroiliitis-she continues to have some SI joint discomfort.  SI joint stretches were discussed.  Chronic pain of both shoulders -currently not symptomatic.  She is seeing  Dr. Camella in the past.  Primary osteoarthritis of both hands-she had bilateral PIP and DIP thickening.  No synovitis was noted.  Joint protection was discussed.  S/p bilateral carpal tunnel release - Dr. Camella  Primary osteoarthritis of both knees-she continues to have discomfort in her knee joints off-and-on.  Currently not symptomatic.  Primary osteoarthritis of both feet-proper fitting shoes were advised.  Plantar fasciitis of left foot -she had a flare in April which resolved.  DDD (degenerative disc disease), cervical-she had good range of motion without discomfort.  Osteoporosis screening-Will schedule DEXA scan.  Postmenopausal  Other medical problems are listed as follows:  History of peptic ulcer disease  History of attention deficit disorder  History of asthma  Coarse tremors  History of cholecystectomy  Vitamin B12 deficiency  Other ulcerative colitis without complication (HCC) - Plan: adalimumab  (HUMIRA , 2 PEN,) 40 MG/0.4ML pen  Orders: Orders Placed This Encounter  Procedures   CBC with Differential/Platelet   Comprehensive metabolic panel with GFR   QuantiFERON-TB Gold Plus   Meds ordered this encounter  Medications   adalimumab  (HUMIRA , 2 PEN,) 40 MG/0.4ML pen    Sig: Inject 0.4 mLs (40 mg total) into the  skin every 14 (fourteen) days.    Dispense:  6 each    Refill:  0    Prescription Type::   Renewal     Follow-Up Instructions: Return in about 5 months (around 04/17/2024) for UC, inflammatory arthritis.   Maya Nash, MD  Note - This record has been created using Animal nutritionist.  Chart creation errors have been sought, but may not always  have been located. Such creation errors do not reflect on  the standard of medical care.

## 2023-11-16 ENCOUNTER — Other Ambulatory Visit: Payer: Self-pay | Admitting: Physician Assistant

## 2023-11-18 ENCOUNTER — Ambulatory Visit: Attending: Rheumatology | Admitting: Rheumatology

## 2023-11-18 ENCOUNTER — Encounter: Payer: Self-pay | Admitting: Rheumatology

## 2023-11-18 VITALS — BP 115/78 | HR 80 | Temp 97.7°F | Resp 15 | Ht 65.0 in | Wt 197.4 lb

## 2023-11-18 DIAGNOSIS — Z8711 Personal history of peptic ulcer disease: Secondary | ICD-10-CM

## 2023-11-18 DIAGNOSIS — M25521 Pain in right elbow: Secondary | ICD-10-CM

## 2023-11-18 DIAGNOSIS — Z79899 Other long term (current) drug therapy: Secondary | ICD-10-CM

## 2023-11-18 DIAGNOSIS — G252 Other specified forms of tremor: Secondary | ICD-10-CM

## 2023-11-18 DIAGNOSIS — M25511 Pain in right shoulder: Secondary | ICD-10-CM

## 2023-11-18 DIAGNOSIS — M461 Sacroiliitis, not elsewhere classified: Secondary | ICD-10-CM

## 2023-11-18 DIAGNOSIS — K5180 Other ulcerative colitis without complications: Secondary | ICD-10-CM

## 2023-11-18 DIAGNOSIS — M19041 Primary osteoarthritis, right hand: Secondary | ICD-10-CM

## 2023-11-18 DIAGNOSIS — Z9049 Acquired absence of other specified parts of digestive tract: Secondary | ICD-10-CM

## 2023-11-18 DIAGNOSIS — Z8659 Personal history of other mental and behavioral disorders: Secondary | ICD-10-CM

## 2023-11-18 DIAGNOSIS — Z780 Asymptomatic menopausal state: Secondary | ICD-10-CM

## 2023-11-18 DIAGNOSIS — M19071 Primary osteoarthritis, right ankle and foot: Secondary | ICD-10-CM

## 2023-11-18 DIAGNOSIS — M5030 Other cervical disc degeneration, unspecified cervical region: Secondary | ICD-10-CM

## 2023-11-18 DIAGNOSIS — Z13820 Encounter for screening for osteoporosis: Secondary | ICD-10-CM

## 2023-11-18 DIAGNOSIS — M170 Bilateral primary osteoarthritis of knee: Secondary | ICD-10-CM

## 2023-11-18 DIAGNOSIS — Z8709 Personal history of other diseases of the respiratory system: Secondary | ICD-10-CM

## 2023-11-18 DIAGNOSIS — M2550 Pain in unspecified joint: Secondary | ICD-10-CM

## 2023-11-18 DIAGNOSIS — M25512 Pain in left shoulder: Secondary | ICD-10-CM

## 2023-11-18 DIAGNOSIS — M722 Plantar fascial fibromatosis: Secondary | ICD-10-CM

## 2023-11-18 DIAGNOSIS — E538 Deficiency of other specified B group vitamins: Secondary | ICD-10-CM

## 2023-11-18 DIAGNOSIS — Z9889 Other specified postprocedural states: Secondary | ICD-10-CM

## 2023-11-18 DIAGNOSIS — G8929 Other chronic pain: Secondary | ICD-10-CM

## 2023-11-18 DIAGNOSIS — M19042 Primary osteoarthritis, left hand: Secondary | ICD-10-CM

## 2023-11-18 DIAGNOSIS — M19072 Primary osteoarthritis, left ankle and foot: Secondary | ICD-10-CM

## 2023-11-18 MED ORDER — HUMIRA (2 PEN) 40 MG/0.4ML ~~LOC~~ AJKT: 40.0000 mg | AUTO-INJECTOR | SUBCUTANEOUS | 0 refills | Status: DC

## 2023-11-18 NOTE — Patient Instructions (Addendum)
 Standing Labs We placed an order today for your standing lab work.   Please have your standing labs drawn in February and every 3 months  Please have your labs drawn 2 weeks prior to your appointment so that the provider can discuss your lab results at your appointment, if possible.  Please note that you may see your imaging and lab results in MyChart before we have reviewed them. We will contact you once all results are reviewed. Please allow our office up to 72 hours to thoroughly review all of the results before contacting the office for clarification of your results.  WALK-IN LAB HOURS  Monday through Thursday from 8:00 am -12:30 pm and 1:00 pm-4:30 pm and Friday from 8:00 am-12:00 pm.  Patients with office visits requiring labs will be seen before walk-in labs.  You may encounter longer than normal wait times. Please allow additional time. Wait times may be shorter on  Monday and Thursday afternoons.  We do not book appointments for walk-in labs. We appreciate your patience and understanding with our staff.   Labs are drawn by [facility]. Please bring your co-pay at the time of your lab draw.  You may receive a bill from [facility] for your lab work.  Please note if you are on Hydroxychloroquine and and an order has been placed for a Hydroxychloroquine level,  you will need to have it drawn 4 hours or more after your last dose.  If you wish to have your labs drawn at another location, please call the office 24 hours in advance so we can fax the orders.  The office is located at [address], [place], [place] [address]   If you have any questions regarding directions or hours of operation,  please call [phone].   As a reminder, please drink plenty of water prior to coming for your lab work. Thanks!   Vaccines You are taking a medication(s) that can suppress your immune system.  The following immunizations are recommended: Flu annually (high dose) RSV Covid-19  Td/Tdap  (tetanus, diphtheria, pertussis) every 10 years Pneumonia (Prevnar 15 then Pneumovax 23 at least 1 year apart.  Alternatively, can take Prevnar 20 without needing additional dose) Shingrix: 2 doses from 4 weeks to 6 months apart  Please check with your PCP to make sure you are up to date.   If you have signs or symptoms of an infection or start antibiotics: First, call your PCP for workup of your infection. Hold your medication through the infection, until you complete your antibiotics, and until symptoms resolve if you take the following: Injectable medication (Actemra, Benlysta, Cimzia, Cosentyx, Enbrel, Humira , Kevzara, Orencia, Remicade, Simponi, Stelara, Taltz, Tremfya) Methotrexate  Leflunomide (Arava) Mycophenolate (Cellcept) Xeljanz, Olumiant, or Rinvoq   Please get an annual skin examination to screen for skin cancer while you are on Humira .  Please use sunscreen and sun protection.

## 2023-11-18 NOTE — Addendum Note (Signed)
 Addended by: KNUTE REENA DEL on: 11/18/2023 04:32 PM   Modules accepted: Orders

## 2023-11-19 ENCOUNTER — Ambulatory Visit: Payer: Self-pay | Admitting: Rheumatology

## 2023-11-20 LAB — CBC WITH DIFFERENTIAL/PLATELET
Absolute Lymphocytes: 2776 {cells}/uL (ref 850–3900)
Absolute Monocytes: 616 {cells}/uL (ref 200–950)
Basophils Absolute: 40 {cells}/uL (ref 0–200)
Basophils Relative: 0.5 %
Eosinophils Absolute: 192 {cells}/uL (ref 15–500)
Eosinophils Relative: 2.4 %
HCT: 40.4 % (ref 35.0–45.0)
Hemoglobin: 13.1 g/dL (ref 11.7–15.5)
MCH: 29.6 pg (ref 27.0–33.0)
MCHC: 32.4 g/dL (ref 32.0–36.0)
MCV: 91.4 fL (ref 80.0–100.0)
MPV: 10.4 fL (ref 7.5–12.5)
Monocytes Relative: 7.7 %
Neutro Abs: 4376 {cells}/uL (ref 1500–7800)
Neutrophils Relative %: 54.7 %
Platelets: 297 Thousand/uL (ref 140–400)
RBC: 4.42 Million/uL (ref 3.80–5.10)
RDW: 12.8 % (ref 11.0–15.0)
Total Lymphocyte: 34.7 %
WBC: 8 Thousand/uL (ref 3.8–10.8)

## 2023-11-20 LAB — QUANTIFERON-TB GOLD PLUS
Mitogen-NIL: 9.39 [IU]/mL
NIL: 0.02 [IU]/mL
QuantiFERON-TB Gold Plus: NEGATIVE
TB1-NIL: 0 [IU]/mL
TB2-NIL: 0.02 [IU]/mL

## 2023-11-20 LAB — COMPREHENSIVE METABOLIC PANEL WITH GFR
AG Ratio: 1.5 (calc) (ref 1.0–2.5)
ALT: 17 U/L (ref 6–29)
AST: 18 U/L (ref 10–35)
Albumin: 4.5 g/dL (ref 3.6–5.1)
Alkaline phosphatase (APISO): 96 U/L (ref 37–153)
BUN: 13 mg/dL (ref 7–25)
CO2: 28 mmol/L (ref 20–32)
Calcium: 9.6 mg/dL (ref 8.6–10.4)
Chloride: 104 mmol/L (ref 98–110)
Creat: 0.81 mg/dL (ref 0.50–1.05)
Globulin: 3 g/dL (ref 1.9–3.7)
Glucose, Bld: 82 mg/dL (ref 65–99)
Potassium: 4.3 mmol/L (ref 3.5–5.3)
Sodium: 139 mmol/L (ref 135–146)
Total Bilirubin: 0.5 mg/dL (ref 0.2–1.2)
Total Protein: 7.5 g/dL (ref 6.1–8.1)
eGFR: 83 mL/min/1.73m2 (ref >=60)

## 2023-11-22 NOTE — Progress Notes (Signed)
TB Gold negative

## 2023-12-25 ENCOUNTER — Other Ambulatory Visit: Payer: Self-pay | Admitting: Physician Assistant

## 2023-12-28 ENCOUNTER — Telehealth: Payer: Self-pay | Admitting: Pharmacist

## 2023-12-28 DIAGNOSIS — K5180 Other ulcerative colitis without complications: Secondary | ICD-10-CM

## 2023-12-28 DIAGNOSIS — M2550 Pain in unspecified joint: Secondary | ICD-10-CM

## 2023-12-28 MED ORDER — HUMIRA (2 PEN) 40 MG/0.4ML ~~LOC~~ AJKT: 40.0000 mg | AUTO-INJECTOR | SUBCUTANEOUS | 1 refills | Status: DC

## 2023-12-28 NOTE — Telephone Encounter (Signed)
 Received fax from [employer]. Patient must fill Humira  with [facility]. Rx sent to pharmacy. MyChart message sent to patient  Pharmacy phone: [phone]  Sherry Pennant, PharmD, MPH, BCPS, CPP Clinical Pharmacist

## 2023-12-28 NOTE — Telephone Encounter (Signed)
 Last Fill: 09/15/2023  Labs: 11/18/2023  CBC and CMP are normal.   Next Visit: 04/19/2024  Last Visit: 11/18/2023  DX: Other ulcerative colitis without complication   Current Dose per office note 11/18/2023: methotrexate  4 tablets every 7 days   Okay to refill Methotrexate ?

## 2024-01-05 ENCOUNTER — Ambulatory Visit (HOSPITAL_BASED_OUTPATIENT_CLINIC_OR_DEPARTMENT_OTHER)
Admission: RE | Admit: 2024-01-05 | Discharge: 2024-01-05 | Disposition: A | Discharge status: Home / Self Care | Source: Ambulatory Visit | Attending: Rheumatology | Admitting: Rheumatology

## 2024-01-05 DIAGNOSIS — Z13820 Encounter for screening for osteoporosis: Secondary | ICD-10-CM | POA: Insufficient documentation

## 2024-01-05 NOTE — Progress Notes (Signed)
 Bone density is normal.  Patient should she take total calcium 1200 mg daily between supplement and diet.  Muscle strengthening and regular exercise is advised.  Will repeat DEXA scan in 5 years.

## 2024-01-19 ENCOUNTER — Telehealth: Payer: Self-pay

## 2024-01-19 NOTE — Telephone Encounter (Signed)
 Patient must fill with [facility] as of 01/14/2024. Called [facility] to confirm what issue is exactly occurring. Per rep, they are getting a rejection for patient being locked out of pharmacy.  Called [facility] for clarification - per rep, they had asked pharmacy to re-try claim. Conference called patient with pharmacy to schedule shipment. Pharmacy line fell off.  Provided patient with [facility] for her to call and re-attempt. Advised her to call office back with any issues  Sherry Pennant, PharmD, MPH, BCPS, CPP Clinical Pharmacist

## 2024-01-19 NOTE — Telephone Encounter (Signed)
 Patient contacted the office and states she was called by someone at [facility] and advised her they could not fill her humira  because it needs to go to a different specialty pharmacy. Patient states she then had tried to reorder her humira  through [facility] to get one last fill while everything else it figured out. Patient states she had received a voicemail from [facility] that states they had tried to contact our office about the Humira . Advised the patient we have not received anything from [facility]. Advised the patient I would send a message over to Pharmacy to see if there was anything going on with her medication or coverage. Please advise.

## 2024-01-28 ENCOUNTER — Telehealth: Payer: Self-pay

## 2024-01-28 DIAGNOSIS — K5180 Other ulcerative colitis without complications: Secondary | ICD-10-CM

## 2024-01-28 DIAGNOSIS — Z79899 Other long term (current) drug therapy: Secondary | ICD-10-CM

## 2024-01-28 NOTE — Telephone Encounter (Signed)
 Patient contacted office and stated that her insurance will not cover Humira . Patient would like to know what alternatives she has. Please review and advise.

## 2024-01-29 MED ORDER — SIMLANDI (2 PEN) 40 MG/0.4ML ~~LOC~~ AJKT: 40.0000 mg | AUTO-INJECTOR | SUBCUTANEOUS | 1 refills | Status: AC

## 2024-01-29 NOTE — Telephone Encounter (Signed)
 Per rep at [employer] ,insurance covers Simlandi  (not Humira ). Verbal for Simlandi  provided to pharmacy  Patient enrolled into Simlandi  copay card: [id number]:[id number]  MyChart message sent to patient.  Sherry Pennant, PharmD, MPH, BCPS, CPP Clinical Pharmacist

## 2024-01-29 NOTE — Addendum Note (Signed)
 Addended by: DAYNE SHERRY RAMAN on: 01/29/2024 04:33 PM   Modules accepted: Orders

## 2024-02-17 NOTE — Telephone Encounter (Signed)
 [facility]. Per rep, they are now filled HUMIRA  despite the pharmacy itself calling two weeks again stating Humira  was not covered.  Per rep, the test claim for Humira  is now going through fine. They have marked her rx as STAT. I requested they call office directly if any issues with filling med  Sherry Pennant, PharmD, MPH, BCPS, CPP Clinical Pharmacist

## 2024-04-19 ENCOUNTER — Ambulatory Visit: Admitting: Physician Assistant
